# Patient Record
Sex: Female | Born: 1940 | Race: Black or African American | Hispanic: No | Marital: Married | Smoking: Never smoker
Health system: Southern US, Community
[De-identification: ages and names within clinical notes are randomized; demographics above are authoritative.]

## PROBLEM LIST (undated history)

## (undated) DIAGNOSIS — R9389 Abnormal findings on diagnostic imaging of other specified body structures: Secondary | ICD-10-CM

## (undated) DIAGNOSIS — H269 Unspecified cataract: Secondary | ICD-10-CM

## (undated) DIAGNOSIS — K219 Gastro-esophageal reflux disease without esophagitis: Secondary | ICD-10-CM

## (undated) DIAGNOSIS — M1990 Unspecified osteoarthritis, unspecified site: Secondary | ICD-10-CM

## (undated) DIAGNOSIS — Z98811 Dental restoration status: Secondary | ICD-10-CM

## (undated) DIAGNOSIS — E119 Type 2 diabetes mellitus without complications: Secondary | ICD-10-CM

## (undated) DIAGNOSIS — Z973 Presence of spectacles and contact lenses: Secondary | ICD-10-CM

## (undated) DIAGNOSIS — E079 Disorder of thyroid, unspecified: Secondary | ICD-10-CM

## (undated) DIAGNOSIS — N950 Postmenopausal bleeding: Secondary | ICD-10-CM

## (undated) DIAGNOSIS — L700 Acne vulgaris: Secondary | ICD-10-CM

## (undated) DIAGNOSIS — R0600 Dyspnea, unspecified: Secondary | ICD-10-CM

## (undated) DIAGNOSIS — Z9181 History of falling: Secondary | ICD-10-CM

## (undated) DIAGNOSIS — I10 Essential (primary) hypertension: Secondary | ICD-10-CM

## (undated) HISTORY — PX: CATARACT EXTRACTION: SUR2

## (undated) HISTORY — PX: EYE SURGERY: SHX253

## (undated) HISTORY — DX: Disorder of thyroid, unspecified: E07.9

## (undated) HISTORY — DX: Essential (primary) hypertension: I10

## (undated) HISTORY — DX: Unspecified osteoarthritis, unspecified site: M19.90

## (undated) HISTORY — PX: JOINT REPLACEMENT: SHX530

## (undated) HISTORY — PX: TONSILLECTOMY AND ADENOIDECTOMY: SHX28

## (undated) HISTORY — DX: Unspecified cataract: H26.9

## (undated) HISTORY — DX: Acne vulgaris: L70.0

## (undated) HISTORY — PX: TOTAL HIP ARTHROPLASTY: SHX124

---

## 1998-12-23 ENCOUNTER — Other Ambulatory Visit: Admission: RE | Admit: 1998-12-23 | Discharge: 1998-12-23 | Payer: Self-pay | Admitting: *Deleted

## 1999-11-30 ENCOUNTER — Encounter: Payer: Self-pay | Admitting: Endocrinology

## 1999-11-30 ENCOUNTER — Encounter: Admission: RE | Admit: 1999-11-30 | Discharge: 1999-11-30 | Payer: Self-pay | Admitting: Endocrinology

## 2000-02-03 ENCOUNTER — Other Ambulatory Visit: Admission: RE | Admit: 2000-02-03 | Discharge: 2000-02-03 | Payer: Self-pay | Admitting: *Deleted

## 2000-03-06 ENCOUNTER — Ambulatory Visit (HOSPITAL_COMMUNITY): Admission: RE | Admit: 2000-03-06 | Discharge: 2000-03-06 | Payer: Self-pay | Admitting: Internal Medicine

## 2000-03-06 ENCOUNTER — Encounter: Payer: Self-pay | Admitting: Internal Medicine

## 2000-12-14 ENCOUNTER — Encounter: Payer: Self-pay | Admitting: Endocrinology

## 2000-12-14 ENCOUNTER — Encounter: Admission: RE | Admit: 2000-12-14 | Discharge: 2000-12-14 | Payer: Self-pay | Admitting: Endocrinology

## 2001-06-13 ENCOUNTER — Emergency Department (HOSPITAL_COMMUNITY): Admission: EM | Admit: 2001-06-13 | Discharge: 2001-06-14 | Payer: Self-pay | Admitting: Emergency Medicine

## 2001-12-16 ENCOUNTER — Encounter: Admission: RE | Admit: 2001-12-16 | Discharge: 2001-12-16 | Payer: Self-pay | Admitting: Internal Medicine

## 2001-12-16 ENCOUNTER — Encounter: Payer: Self-pay | Admitting: Internal Medicine

## 2002-03-21 ENCOUNTER — Other Ambulatory Visit: Admission: RE | Admit: 2002-03-21 | Discharge: 2002-03-21 | Payer: Self-pay | Admitting: *Deleted

## 2003-01-16 ENCOUNTER — Encounter: Admission: RE | Admit: 2003-01-16 | Discharge: 2003-01-16 | Payer: Self-pay | Admitting: Internal Medicine

## 2003-12-15 ENCOUNTER — Encounter (HOSPITAL_COMMUNITY): Admission: RE | Admit: 2003-12-15 | Discharge: 2004-01-14 | Payer: Self-pay | Admitting: Family Medicine

## 2004-09-20 ENCOUNTER — Encounter: Admission: RE | Admit: 2004-09-20 | Discharge: 2004-09-20 | Payer: Self-pay | Admitting: Internal Medicine

## 2005-10-09 ENCOUNTER — Other Ambulatory Visit: Admission: RE | Admit: 2005-10-09 | Discharge: 2005-10-09 | Payer: Self-pay | Admitting: *Deleted

## 2005-10-20 ENCOUNTER — Encounter: Admission: RE | Admit: 2005-10-20 | Discharge: 2005-10-20 | Payer: Self-pay | Admitting: *Deleted

## 2006-01-23 ENCOUNTER — Encounter (HOSPITAL_COMMUNITY): Admission: RE | Admit: 2006-01-23 | Discharge: 2006-02-12 | Payer: Self-pay | Admitting: *Deleted

## 2006-02-13 HISTORY — PX: CATARACT EXTRACTION W/ INTRAOCULAR LENS  IMPLANT, BILATERAL: SHX1307

## 2006-02-14 ENCOUNTER — Encounter (HOSPITAL_COMMUNITY): Admission: RE | Admit: 2006-02-14 | Discharge: 2006-03-16 | Payer: Self-pay | Admitting: *Deleted

## 2006-03-19 ENCOUNTER — Encounter (HOSPITAL_COMMUNITY): Admission: RE | Admit: 2006-03-19 | Discharge: 2006-04-18 | Payer: Self-pay | Admitting: *Deleted

## 2006-11-01 ENCOUNTER — Encounter: Admission: RE | Admit: 2006-11-01 | Discharge: 2006-11-01 | Payer: Self-pay | Admitting: *Deleted

## 2007-10-03 HISTORY — PX: TOTAL HIP ARTHROPLASTY: SHX124

## 2007-10-07 ENCOUNTER — Inpatient Hospital Stay (HOSPITAL_COMMUNITY): Admission: RE | Admit: 2007-10-07 | Discharge: 2007-10-11 | Payer: Self-pay | Admitting: Orthopedic Surgery

## 2007-11-12 ENCOUNTER — Ambulatory Visit: Admission: RE | Admit: 2007-11-12 | Discharge: 2007-11-12 | Payer: Self-pay | Admitting: Orthopedic Surgery

## 2007-11-14 ENCOUNTER — Encounter (HOSPITAL_COMMUNITY): Admission: RE | Admit: 2007-11-14 | Discharge: 2007-12-14 | Payer: Self-pay | Admitting: Orthopedic Surgery

## 2007-12-23 ENCOUNTER — Encounter (HOSPITAL_COMMUNITY): Admission: RE | Admit: 2007-12-23 | Discharge: 2008-01-22 | Payer: Self-pay | Admitting: Orthopedic Surgery

## 2008-01-17 ENCOUNTER — Encounter: Admission: RE | Admit: 2008-01-17 | Discharge: 2008-01-17 | Payer: Self-pay | Admitting: Family Medicine

## 2008-06-03 ENCOUNTER — Inpatient Hospital Stay (HOSPITAL_COMMUNITY): Admission: RE | Admit: 2008-06-03 | Discharge: 2008-06-07 | Payer: Self-pay | Admitting: Orthopedic Surgery

## 2008-06-03 HISTORY — PX: TOTAL HIP ARTHROPLASTY: SHX124

## 2009-01-26 ENCOUNTER — Encounter: Admission: RE | Admit: 2009-01-26 | Discharge: 2009-01-26 | Payer: Self-pay | Admitting: Family Medicine

## 2009-06-24 ENCOUNTER — Encounter: Admission: RE | Admit: 2009-06-24 | Discharge: 2009-06-24 | Payer: Self-pay | Admitting: Emergency Medicine

## 2009-11-02 ENCOUNTER — Encounter: Admission: RE | Admit: 2009-11-02 | Discharge: 2009-11-02 | Payer: Self-pay | Admitting: Family Medicine

## 2009-11-09 ENCOUNTER — Other Ambulatory Visit: Admission: RE | Admit: 2009-11-09 | Discharge: 2009-11-09 | Payer: Self-pay | Admitting: Diagnostic Radiology

## 2009-11-09 ENCOUNTER — Encounter: Admission: RE | Admit: 2009-11-09 | Discharge: 2009-11-09 | Payer: Self-pay | Admitting: Family Medicine

## 2009-12-14 DIAGNOSIS — E890 Postprocedural hypothyroidism: Secondary | ICD-10-CM

## 2009-12-14 HISTORY — DX: Postprocedural hypothyroidism: E89.0

## 2009-12-25 HISTORY — PX: THYROIDECTOMY: SHX17

## 2009-12-27 ENCOUNTER — Encounter (INDEPENDENT_AMBULATORY_CARE_PROVIDER_SITE_OTHER): Payer: Self-pay | Admitting: Surgery

## 2009-12-27 ENCOUNTER — Ambulatory Visit (HOSPITAL_COMMUNITY): Admission: RE | Admit: 2009-12-27 | Discharge: 2009-12-28 | Payer: Self-pay | Admitting: Surgery

## 2010-02-24 ENCOUNTER — Encounter
Admission: RE | Admit: 2010-02-24 | Discharge: 2010-02-24 | Payer: Self-pay | Source: Home / Self Care | Attending: Family Medicine | Admitting: Family Medicine

## 2010-04-26 LAB — SURGICAL PCR SCREEN
MRSA, PCR: NEGATIVE
Staphylococcus aureus: NEGATIVE

## 2010-04-26 LAB — CBC
HCT: 40.5 % (ref 36.0–46.0)
MCH: 31.7 pg (ref 26.0–34.0)
RBC: 4.36 MIL/uL (ref 3.87–5.11)
RDW: 13.9 % (ref 11.5–15.5)
WBC: 5 10*3/uL (ref 4.0–10.5)

## 2010-04-26 LAB — BASIC METABOLIC PANEL
Chloride: 100 mEq/L (ref 96–112)
Creatinine, Ser: 0.8 mg/dL (ref 0.4–1.2)
GFR calc Af Amer: >60 mL/min (ref >60)
Glucose, Bld: 90 mg/dL (ref 70–99)

## 2010-05-25 LAB — CBC
HCT: 27.9 % — ABNORMAL LOW (ref 36.0–46.0)
MCHC: 34.1 g/dL (ref 30.0–36.0)
MCHC: 34.2 g/dL (ref 30.0–36.0)
MCHC: 34.7 g/dL (ref 30.0–36.0)
MCHC: 34.8 g/dL (ref 30.0–36.0)
MCV: 94.5 fL (ref 78.0–100.0)
MCV: 94.9 fL (ref 78.0–100.0)
Platelets: 137 10*3/uL — ABNORMAL LOW (ref 150–400)
Platelets: 145 10*3/uL — ABNORMAL LOW (ref 150–400)
Platelets: 159 10*3/uL (ref 150–400)
Platelets: 195 10*3/uL (ref 150–400)
RBC: 3.17 MIL/uL — ABNORMAL LOW (ref 3.87–5.11)
RDW: 13.2 % (ref 11.5–15.5)
RDW: 13.7 % (ref 11.5–15.5)
RDW: 13.9 % (ref 11.5–15.5)
WBC: 4.1 10*3/uL (ref 4.0–10.5)
WBC: 6.2 10*3/uL (ref 4.0–10.5)

## 2010-05-25 LAB — COMPREHENSIVE METABOLIC PANEL
Albumin: 4.1 g/dL (ref 3.5–5.2)
BUN: 12 mg/dL (ref 6–23)
Calcium: 9.5 mg/dL (ref 8.4–10.5)
GFR calc non Af Amer: >60 mL/min (ref >60)
Potassium: 3.7 mEq/L (ref 3.5–5.1)

## 2010-05-25 LAB — URINALYSIS, ROUTINE W REFLEX MICROSCOPIC
Nitrite: NEGATIVE
Specific Gravity, Urine: 1.012 (ref 1.005–1.030)
Urobilinogen, UA: 0.2 mg/dL (ref 0.0–1.0)
pH: 5.5 (ref 5.0–8.0)

## 2010-05-25 LAB — BASIC METABOLIC PANEL
BUN: 4 mg/dL — ABNORMAL LOW (ref 6–23)
BUN: 5 mg/dL — ABNORMAL LOW (ref 6–23)
Creatinine, Ser: 0.62 mg/dL (ref 0.4–1.2)
GFR calc Af Amer: >60 mL/min (ref >60)
GFR calc non Af Amer: >60 mL/min (ref >60)
Glucose, Bld: 142 mg/dL — ABNORMAL HIGH (ref 70–99)
Glucose, Bld: 144 mg/dL — ABNORMAL HIGH (ref 70–99)
Potassium: 3.8 mEq/L (ref 3.5–5.1)
Potassium: 3.9 mEq/L (ref 3.5–5.1)
Sodium: 135 mEq/L (ref 135–145)

## 2010-05-25 LAB — PROTIME-INR
Prothrombin Time: 13.4 seconds (ref 11.6–15.2)
Prothrombin Time: 15.7 seconds — ABNORMAL HIGH (ref 11.6–15.2)
Prothrombin Time: 22.9 seconds — ABNORMAL HIGH (ref 11.6–15.2)

## 2010-05-25 LAB — TYPE AND SCREEN: Antibody Screen: NEGATIVE

## 2010-06-28 NOTE — H&P (Signed)
NAME:  MINH, JASPER NO.:  [id number]   MEDICAL RECORD NO.:  [id number]          PATIENT TYPE:  INP   LOCATION:  1615                         FACILITY:  [facility]   PHYSICIAN:  Ollen Gross, M.D.    DATE OF BIRTH:  1940/12/24   DATE OF ADMISSION:  06/03/2008  DATE OF DISCHARGE:                              HISTORY & PHYSICAL   NOTE:  Date of office visit history and physical was May 26, 2008.   CHIEF COMPLAINT:  Right hip pain.   HISTORY OF THE PRESENT ILLNESS:  The patient is a 70 year old female who  has been seen by Dr. Lequita Halt for ongoing right hip pain.  She is well-  known to [place] having  previously undergone a left total hip back in August  2009.  She is doing well with the left hip, but the right hip continues  to be problematic.  It is felt that she would benefit from surgery.  She  has been seen preoperatively by Dr. Abigail Miyamoto and is felt to be stable for  surgery.   ALLERGIES:  No known drug allergies.   MEDICATIONS:  The patient's current medications include Micardis,  furosemide, levothyroxine, calcium plus D, aspirin, glucosamine, and  Tylenol.   PAST MEDICAL HISTORY:  1. Hypertension.  2. Hypothyroidism.  3. Osteoporosis.   PAST SURGICAL HISTORY:  Left total hip replacement on October 07, 2007.   FAMILY HISTORY:  Father with his emphysema.  Mother with stroke.   SOCIAL HISTORY:  The patient is married, retired and is a nonsmoker.  No  alcohol.  Her husband will be assisting with her care after surgery.   REVIEW OF SYSTEMS:  GENERAL:  No fevers, chills or night sweats.  NEUROLOGIC:  No seizures, syncope or paralysis.  RESPIRATORY:  No  shortness of breath, productive cough or hemoptysis.  CARDIOVASCULAR:  No chest pain.  GASTROINTESTINAL:  No nausea, vomiting or constipation.  GENITOURINARY:  No dysuria, hematuria or discharge.  MUSCULOSKELETAL:  Right hip pain.   PHYSICAL EXAMINATION:  VITAL SIGNS:  Pulse 76, respirations 12 and blood  pressure 138/80.  GENERAL APPEARANCE:  In general this is a 70 year old African American  female, well-nourished and well-developed, and in no acute distress.  She is alert, oriented, cooperative and pleasant; and, is a good  historian.  She is accompanied by her husband.  HEENT:  Normocephalic and atraumatic head.  Pupils are round and react.  Oropharynx is clear.  EOMs are intact.  NECK:  The neck is supple.  CHEST:  The chest is clear.  HEART:  The heart has a regular rate and rhythm.  No murmur.  S1 and S2  noted.  ABDOMEN:  The abdomen is soft and nontender.  Bowel sounds are present.  RECTAL, BREASTS AND GENITALIA:  The rectal, breast and genitalia exams  are not done at present.  EXTREMITIES:  The extremities reveal right hip flexion to 90, zero  internal rotation, zero external rotation and zero abduction.   IMPRESSION:  Osteoarthritis of the right hip.   PLAN:  The patient [place] admitted to [facility] to undergo a  right total replacement arthroplasty.  Surgery will be performed by Dr.  Ollen Gross.      Alexzandrew L. Perkins, P.A.C.      Ollen Gross, M.D.  Electronically Signed    ALP/MEDQ  D:  06/04/2008  T:  06/05/2008  Job:  [id number]   cc:   Chales Salmon. Abigail Miyamoto, M.D.  Fax: [phone]

## 2010-06-28 NOTE — Op Note (Signed)
NAME:  Anne [name], Anne [name] NO.:  [id number]   MEDICAL RECORD NO.:  [id number]          PATIENT TYPE:  INP   LOCATION:  0009                         FACILITY:  [facility]   PHYSICIAN:  Ollen Gross, M.D.    DATE OF BIRTH:  [birth year]   DATE OF PROCEDURE:  10/07/2007  DATE OF DISCHARGE:                               OPERATIVE REPORT   PREOPERATIVE DIAGNOSIS:  Osteoarthritis of the left hip.   POSTOPERATIVE DIAGNOSIS:  Osteoarthritis of the left hip.   PROCEDURE:  Left total hip arthroplasty.   SURGEON:  Ollen Gross, M.D.   ASSISTANT:  Alexzandrew L. Perkins, P.A.C.   ANESTHESIA:  General.   ESTIMATED BLOOD LOSS:  300 mL.   DRAINS:  Hemovac x1.   COMPLICATIONS:  None.   CONDITION:  Stable to recovery.   BRIEF CLINICAL NOTE:  Anne [name] is a 70 year old female with end-stage  arthritis of both hips, left more symptomatic than the right.  She has  failed nonoperative management and presents now for total hip  arthroplasty.   PROCEDURE IN DETAIL:  After the successful administration of general  anesthetic, the patient was placed in the right lateral decubitus  position with the left side up and held with the hip positioner.  The  left lower extremity was isolated from the perineum with plastic drapes,  and prepped and draped in the usual sterile fashion.  A short  posterolateral incision was made with the 10 blade through subcutaneous  tissue, to the level of the fascia lata.  This was incised in line with  the skin incision.  The sciatic nerve was palpated and protected, and  the short external rotators were isolated off the femur.  Capsulectomy  was performed and the hip was dislocated.  The center of the femoral  head was marked and trial prosthesis was placed, such that the center of  the trial head corresponded to the center of the native femoral head.  Osteotomy line was marked on the femoral neck and osteotomy made with an  oscillating saw.  The femoral  head was removed and the femur was  retracted anteriorly to gain acetabular exposure.   The acetabular retractors were placed and the labrum and osteophytes  removed.  Reaming started at 43 mm, coursing from 2-49 mm; and then a 50-  mm Pinnacle acetabular shell was placed in anatomic position and  transfixed with 2 dome screws.  The Apex hole eliminator was placed and  then the permanent 36-mm neutral Ultramet metal liner was placed, for a  metal-on-metal hip replacement.   The femur was repaired through the canal finder and irrigation.  Axial  reaming was performed to 11.5 mm; proximal reaming to a 9F, and the  sleeve machined through a small crib.  A 9F small trial sleeve was  placed with a 16 x 11 stem and a 36 +6 neck in matching native  anteversion.  The 36 +0 head was placed.  The hip was reduced with  outstanding stability.  It was full extension and full external  rotation, 70 degrees of flexion, 40 degrees  of adduction and 90 degrees  of internal rotation; and 90 degrees of flexion and 70 degrees of  internal rotation.  By placing the left leg on top of the right, it was  felt as though the leg lengths were equal.  The hip was then dislocated  and the femoral trials were removed.  The permanent 60F small sleeve was  placed with a 16 x 11 stem and 36 +6 neck matching the native  anteversion.  The 36 +0 was placed and the hip was reduced with same  ability parameters.  The wound was copiously irrigated with saline  solution and short rotators reattached to the femur through drill holes.  The fascia lata was closed over a Hemovac drain with interrupted #1  Vicryl; the subcutaneous closed with #1 and 2-0 Vicryl and subcuticular  running 4-0 Monocryl.  The incision was cleaned and dried and Steri-  Strips applied.  A drain was hooked to suction and a bulky sterile  dressing applied.  She was placed into a knee immobilizer, awakened and  transferred to recovery in stable  condition.      Ollen Gross, M.D.  Electronically Signed     FA/MEDQ  D:  10/07/2007  T:  10/07/2007  Job:  [id number]

## 2010-06-28 NOTE — H&P (Signed)
NAME:  Anne [name], Anne [name] NO.:  [id number]   MEDICAL RECORD NO.:  [id number]          PATIENT TYPE:  INP   LOCATION:  1621                         [facility]ACILITY[facility]  [facility]   PHYSICIAN:  Ollen Gross, M.D.    DATE OF BIRTH:  1940/06/29   DATE OF ADMISSION:  10/07/2007  DATE OF DISCHARGE:  10/11/2007                              HISTORY & PHYSICAL   CHIEF COMPLAINT:  Left hip pain.   HISTORY OF PRESENT ILLNESS:  The patient is a 70 year old female who is  known to have arthritis in the left hip.  It has been ongoing for quite  some time now.  She has end-stage in both hips with left is more  symptomatic and problematic than the right.  She has failed nonoperative  management.  Now presents for total hip arthroplasty.   ALLERGIES:  No known drug allergies.   CURRENT MEDICATIONS:  Furosemide, baby aspirin, Micardis, levothyroxine,  calcium, vitamin D, Aleve, glucosamine with MSM.   PAST MEDICAL HISTORY:  1. Hypertension.  2. Hypothyroidism.  3. Osteoporosis.   PREVIOUS SURGERY:  Oral surgery 30 years ago.   FAMILY HISTORY:  Father age 42.  Mother  with history of stroke age 70.   SOCIAL HISTORY:  Married, retired, nonsmoker.  No alcohol.  She does  have caregiver lined up after surgery.   REVIEW OF SYSTEMS:  GENERAL:  No fever, chills or night sweats.  NEUROLOGICAL:  No seizures, syncope or paralysis.  RESPIRATORY:  No  shortness of breath, productive cough or hemoptysis.  CARDIOVASCULAR:  No chest pain, orthopnea.  GI:  No nausea, vomiting, diarrhea or  constipation.  GU:  No dysuria, hematuria or discharge.  MUSCULOSKELETAL:  Bilateral hip pain.   PHYSICAL EXAMINATION:  VITAL SIGNS:  Pulse 64, respirations 12, blood  pressure 144/72.  GENERAL:  A 70 year old [place] American female well-nourished, well-  developed, in no acute stress, alert, oriented, cooperative, and  pleasant.  Good historian, accompanied by her husband.  HEENT:  Normocephalic, atraumatic.   Pupils are round, reactive.  EOMs  intact.  NECK:  Supple.  CHEST:  Clear.  HEART:  Regular rate and rhythm without murmur.  ABDOMEN:  Soft, slightly round.  Bowel sounds present.  RECTAL/BREASTS/GENITALIA:  Not done, not pertinent to present illness.  EXTREMITIES:  Left hip flexion 90, 0 internal rotation, 0 external  rotation, 0 abduction, 0 adduction.  Right hip flexion 100, 0 internal  rotation, 0 external rotation, 0 abduction, 0 adduction.   IMPRESSION:  1. Osteoarthritis bilateral hips left more symptomatic than right.  2. Hypertension.  3. Hypothyroidism.  4. Osteoporosis.   PLAN:  The patient admitted to [facility] to undergo a left  total replacement arthroplasty.  Surgery will be performed by Dr. Ollen Gross.      Alexzandrew L. Perkins, P.A.C.      Ollen Gross, M.D.  Electronically Signed    ALP/MEDQ  D:  10/31/2007  T:  11/02/2007  Job:  [id number]   cc:   Chales Salmon. Abigail Miyamoto, M.D.  Fax: [phone]

## 2010-06-28 NOTE — Discharge Summary (Signed)
NAME:  Anne [name], Anne [name] NO.:  [id number]   MEDICAL RECORD NO.:  [id number]          PATIENT TYPE:  INP   LOCATION:  1621                         FACILITY:  [facility]   PHYSICIAN:  Ollen Gross, M.D.    DATE OF BIRTH:  1940-10-22   DATE OF ADMISSION:  10/07/2007  DATE OF DISCHARGE:  10/11/2007                               DISCHARGE SUMMARY   ADMITTING DIAGNOSES:  1. Osteoarthritis of the left hip greater than osteoarthritis right      hip.  2. Hypertension.  3. Hypothyroidism.  4. Osteoporosis.   DISCHARGE DIAGNOSES:  1. Osteoarthritis left hip status post left total hip replacement      arthroplasty.  2. Mild postoperative blood loss anemia.  Did not require transfusion.  3. Hypertension.  4. Hypothyroidism.  5. Osteoporosis.   PROCEDURE:  October 07, 2007 left total hip.  Surgeon Dr. Lequita Halt,  assistant Avel Peace PA-C.  Anesthesia:  General.   CONSULTS:  None.   BRIEF HISTORY:  Ms. Rohwer is a 70 year old female with end-stage  arthritis of both hips left more symptomatic than the right.  Failed  nonoperative management, now presents for total hip arthroplasty.   LABORATORY DATA:  Preop CBC showed hemoglobin 13.0, hematocrit 38.1,  white cell count 3.5, platelets 190, postop hemoglobin 11.5 and then  drifted down to 10.3.  Last known H and H 9.7 and 28.5.  PT/PTT preop  14.8 and 30, respectively.  INR 1.1.  Serial protimes followed.  Last  known PT/INR are 22.9 and 1.9.  Chem panel on admission all within  normal limits.  Serial BMETs were followed.  Electrolytes remained  within normal limits.  Glucose went up from 98 to 150, back down to 141.  Preop UA:  Small leukocyte esterase, many epithelials, only 3-6 white  cells, 0-2 red cells, many bacteria.  Blood group type O+.  Two-view  chest October 01, 2007:  No active disease.  EKG September 07, 2007:  Normal  sinus rhythm, diffuse nonspecific T abnormalities confirmed by Dr.  Abigail Miyamoto.   HOSPITAL COURSE:   The patient admitted to [facility].  Tolerated the procedure well, later transferred to the recovery room  and the orthopedic floor.  Started on PCA and p.o. analgesic pain  control following surgery.  Had had a decent night after evening of  surgery.  Doing pretty well on the morning of day #1.  Started getting  up out of bed, partial weightbearing.  Hemoglobin was stable.  Hemovac  drain placed at time of surgery was pulled without difficulty.  Blood  pressure looked good.  Started back on her home medications. Started  getting up and walking short distances on day #1.  By day #2 getting a  little bit more in the hallway.  Dressing was changed.  Incision looked  good.  Hemoglobin stable.  No complaints.  By day #3 she was finally  starting to get a good night's sleep.  By day #2 into day #3, no bowel  movement yet.  Worked on GI tract with medications.  Hemoglobin was 9.7.  Needed a little bit more therapy.  Placed her on an iron supplement.  Felt 1 more day.  By the following day of October 11, 2007 she was  progressing well, walking better, 50 feet and then later in the  afternoon to 225 feet.  Met her goals and was discharged home.   DISCHARGE PLAN:  1. The patient was discharged home on October 11, 2007.  2. Discharge diagnoses, please see above.  3. Discharge medications:  Coumadin, Nu-Iron, Percocet, Phenergan,      Robaxin.   DIET:  Heart-healthy diet.   ACTIVITY:  Partial weightbearing 25% to 50% left lower extremity.  Home  health PT, home health nursing, total hip protocol, hip precautions.   FOLLOWUP:  Two weeks.   DISPOSITION:  Home.   CONDITION UPON DISCHARGE:  Improved.      Alexzandrew L. Perkins, P.A.C.      Ollen Gross, M.D.  Electronically Signed    ALP/MEDQ  D:  10/31/2007  T:  11/02/2007  Job:  [id number]   cc:   Chales Salmon. Abigail Miyamoto, M.D.  Fax: [phone]

## 2010-06-28 NOTE — Op Note (Signed)
NAME:  Anne [name], Anne [name] NO.:  [id number]   MEDICAL RECORD NO.:  [id number]          PATIENT TYPE:  INP   LOCATION:  0002                         FACILITY:  [facility]   PHYSICIAN:  Ollen Gross, M.D.    DATE OF BIRTH:  1940-02-24   DATE OF PROCEDURE:  06/03/2008  DATE OF DISCHARGE:                               OPERATIVE REPORT   PREOPERATIVE DIAGNOSIS:  Osteoarthritis, right hip.   POSTOPERATIVE DIAGNOSIS:  Osteoarthritis, right hip.   PROCEDURE:  Right total hip arthroplasty.   SURGEON:  Ollen Gross, M.D.   ASSISTANT:  Avel Peace, PA-C.   ANESTHESIA:  General.   ESTIMATED BLOOD LOSS:  200.   DRAINS:  Hemovac x1.   COMPLICATIONS:  None.   CONDITION:  Stable to recovery room.   BRIEF CLINICAL NOTE:  Ms. Hiltunen is a 70 year old female with end-stage  arthritis of the right hip with progressively worsening pain and  dysfunction.  She has had a previous successful left total hip  arthroplasty and presents now for right total hip arthroplasty.   PROCEDURE IN DETAIL:  After successful administration of general  anesthetic, the patient was placed in the left lateral decubitus  position with the right side up and held with the hip positioner, the  right lower extremity isolated from her perineum with plastic drapes and  prepped and draped in the usual sterile fashion.  A posterolateral  incision was made with a 10 blade through the subcutaneous tissue to the  level of the fascia lata, which was incised in line with the skin  incision.  The sciatic nerve was palpated and protected and the short  external rotators isolated off the femur.  She had a significant  capsular contraction and we released the capsule and removed the  posterior capsule, which was very shortened.  I was then able to  dislocate the hip.  The center of femoral head was marked and a trial  prosthesis placed such that the center of the trial head corresponded to  the center of her native  femoral head.  The osteotomy line was marked on  the femoral neck and osteotomy made with an oscillating saw.  The  femoral head was removed and the femur was retracted anteriorly to gain  acetabular exposure.   Acetabular retractors were placed and labrum and osteophytes removed.  Acetabular reaming started at 45 mm, in coursing increments of 2 to 49  mm, and then a 50-mm Pinnacle acetabular shell was placed in anatomic  position and transfixed with 2 dome screws.  The Apex hole eliminator  was placed and then the 36-mm neutral Ultamet metal liner was placed for  a metal-on-metal hip replacement.   The femur was prepared with the canal finder and irrigation.  Axial  reaming was performed to 11.5 mm, proximal reaming to 16 F,  and the  sleeve machined to a large.  A 16 F large sleeve was placed, a 16 x 11  stem, and a 36 +6 neck.  We matched her native anteversion with this.  The 36 +0 head was placed and the  hip was reduced, with outstanding  stability.  There was full extension, full external rotation, 70 degrees  of flexion, 40 degrees of adduction and 90 degrees of internal rotation  and 90 degrees of flexion and 70 degrees of internal rotation.  By  placing the right leg on top of the left, it was felt that the leg  lengths were equal.  The hip was then dislocated and the trials removed.  The permanent 16 F large sleeve was placed with the 16 x 11 stem and 36  +6 neck matching native anteversion.  The 36 +0 head was placed and the  hip was reduced with the same stability parameters.  The wounds were  copiously irrigated with saline solution and the short rotators  reattached to the femur through drill holes.  The fascia lata was closed  over a Hemovac drain with interrupted #1 Vicryl, the subcu closed with  #1-0 and #2-0 Vicryl and subcuticular running 4-0 Monocryl.  The drain  was hooked to suction.  The incision was cleaned and dried and Steri-  Strips and bulky sterile dressing  were applied.  She was then placed  into a knee immobilizer, awakened and transported to recovery in stable  condition.      Ollen Gross, M.D.  Electronically Signed     FA/MEDQ  D:  06/03/2008  T:  06/03/2008  Job:  [id number]

## 2010-07-01 NOTE — Discharge Summary (Signed)
NAME:  Anne [name], Anne [name] NO.:  [id number]   MEDICAL RECORD NO.:  [id number]          PATIENT TYPE:  INP   LOCATION:  1615                         FACILITY[facility]   PHYSICIAN:  Ollen Gross, M.D.    DATE OF BIRTH:  [birth year]   DATE OF ADMISSION:  06/03/2008  DATE OF DISCHARGE:  06/07/2008                               DISCHARGE SUMMARY   ADMISSION DIAGNOSES:  1. Osteoarthritis right hip.  2. Hypertension.  3. Hypothyroidism.  4. Osteoporosis.   DISCHARGE DIAGNOSES:  1. Osteoarthritis right hip status post right total replacement      arthroplasty.  2. Acute postop blood loss anemia.  Did not require transfusion.  3. Hypertension.  4. Hypothyroidism.  5. Osteoporosis.   PROCEDURE:  June 03, 2008, right total hip.  Surgeon Dr. Lequita Halt.  Assistant, Avel Peace PA-C.  Anesthesia general.   CONSULTATIONS:  None.   BRIEF HISTORY:  Anne [name] is a 70 year old female with end-stage  arthritis right hip, progressive worsening pain dysfunction, had a  successful left total hip, now presents for a right total hip.   LABORATORY DATA:  Preop CBC showed hemoglobin 13.79, hematocrit 40.2,  white cell count 4.1, platelets 195, postop hemoglobin 10.2 drifted down  to 9.1.  Last noted H&H 8.4 and 24.7.  PT/PTT preop 13.4 and 28,  respectively.  INR 1.0.  Serial protimes followed per Coumadin protocol.  Last PT/INR 23.6 and 2.0.  Chem panel on admission all within normal  limits.  Serial B mets followed.  Electrolytes remained within normal  limits.  Preop UA negative.  Blood group type O+.  EKG May 05, 2008,  sinus rhythm, atrial premature complexes, nonspecific diffuse ST and T-  wave abnormalities confirmed by Dr. Abigail Miyamoto.   X-rays right hip film May 29, 2008, advanced degenerative changes  right hip.  Portable pelvis and hip film on June 03, 2008, expected  postop appearance right hip prosthesis.  No evidence of fracture  dislocation.   HOSPITAL COURSE:   The patient was admitted to [facility],  taken to OR, underwent above-stated procedure without complication.  The  patient tolerated procedure well, later transferred to the recovery room  on orthopedic floor.  Started on PCA and p.o. analgesic pain control  following surgery.  Doing pretty well on the morning of day #1, in good  spirits, husband in the room.  Started getting up with therapy.  Blood  pressure was a little on the soft side, so we put her blood pressure  medications on parameters.  She had excellent urinary output.  Hemoglobin stable.  By day #2, she was a little sleepy, but doing well  and hemoglobin was down 9.7.  She was asymptomatic with this.  We put  her on iron, discontinued the PCA on day #1 and the fluids on day #2.  Dressing changed, incision looked good, started walking about 80 feet.  Seen on rounds on day #3.  Hemoglobins down a little bit further.  She  is asymptomatic with this, wanted to make sure that she was going to be  stable.  Continued to do her therapy.  She was seen by the weekend  coverage ,progressing well.  Due to her anemia, they monitored for one  more day.  By the following day, her hemoglobin was down a little bit  further at 8.4.  She was asymptomatic with this.  She was progressing  well with physical therapy and then she was discharged home.   DISCHARGE/PLAN:  1. Patient discharged home on June 07, 2008.  2. Discharge diagnoses, please see above.  3. Discharge medications:  Percocet, Robaxin, Nu-Iron Coumadin.  4. Diet:  Heart-healthy diet.  5. Activity:  She is partial weightbearing 25-50%, right leg hip      precautions total hip protocol.  6. Follow-up 2 weeks.   DISPOSITION:  Home.   CONDITION ON DISCHARGE:  Improved.      Alexzandrew L. Perkins, P.A.C.      Ollen Gross, M.D.  Electronically Signed    ALP/MEDQ  D:  06/18/2008  T:  06/18/2008  Job:  [id number]   cc:   Ollen Gross, M.D.  Fax: [phone]    Chales Salmon. Abigail Miyamoto, M.D.  Fax: [phone]

## 2011-03-20 DIAGNOSIS — M76899 Other specified enthesopathies of unspecified lower limb, excluding foot: Secondary | ICD-10-CM | POA: Diagnosis not present

## 2011-03-30 ENCOUNTER — Other Ambulatory Visit: Payer: Self-pay | Admitting: Family Medicine

## 2011-03-30 ENCOUNTER — Ambulatory Visit: Payer: Medicare Other

## 2011-03-30 ENCOUNTER — Ambulatory Visit (INDEPENDENT_AMBULATORY_CARE_PROVIDER_SITE_OTHER): Payer: Medicare Other | Admitting: Family Medicine

## 2011-03-30 VITALS — BP 158/79 | HR 76 | Temp 97.9°F | Resp 18 | Ht 65.0 in | Wt 217.0 lb

## 2011-03-30 DIAGNOSIS — L0291 Cutaneous abscess, unspecified: Secondary | ICD-10-CM | POA: Diagnosis not present

## 2011-03-30 DIAGNOSIS — L0390 Cellulitis, unspecified: Secondary | ICD-10-CM

## 2011-03-30 MED ORDER — DOXYCYCLINE HYCLATE 100 MG PO TABS: 100.0000 mg | ORAL_TABLET | Freq: Two times a day (BID) | ORAL | Status: AC

## 2011-03-30 MED ORDER — MUPIROCIN 2 % EX OINT: TOPICAL_OINTMENT | Freq: Three times a day (TID) | CUTANEOUS | Status: AC

## 2011-03-30 NOTE — Patient Instructions (Addendum)
Keep the wound clean. Cover with a Band-Aid. Return if not completely well by the time the antibiotics are finished. Turn sooner if problems arise.

## 2011-03-30 NOTE — Progress Notes (Signed)
  Subjective:    Patient ID: Anne [name], female    DOB: April 21, 1940, 71 y.o.   MRN: [id number]  HPI She has a sore place on her right upper chest wall. She was aware of something there for the past couple of months. Over the past week it tested up, got red, with a white center which drained. Word or so she came in here to get it checked. She is not on clindamycin today only for a dental appointment that she had. She will see her dentist back again next week, but has not stay on clindamycin except for pre-dental care. Denies other places on her body. She is not diabetic.   Review of Systems Unremarkable.    Objective:   Physical Exam  1 CM diameter scabbed area right upper chest wall. Minimal surrounding erythema. The crust was scraped off and there is a small ulcerated cavity. This was cultured.      Assessment & Plan:  Abscess right chest wall.  Treat with topical ointment and oral antibiotic. Return if worse

## 2011-04-02 LAB — WOUND CULTURE
Gram Stain: NONE SEEN
Gram Stain: NONE SEEN

## 2011-05-26 ENCOUNTER — Ambulatory Visit: Payer: Medicare Other

## 2011-06-27 ENCOUNTER — Other Ambulatory Visit: Payer: Self-pay | Admitting: Family Medicine

## 2011-06-27 DIAGNOSIS — Z1231 Encounter for screening mammogram for malignant neoplasm of breast: Secondary | ICD-10-CM

## 2011-08-28 ENCOUNTER — Ambulatory Visit
Admission: RE | Admit: 2011-08-28 | Discharge: 2011-08-28 | Disposition: A | Discharge status: Home / Self Care | Payer: Medicare Other | Source: Ambulatory Visit | Attending: Family Medicine | Admitting: Family Medicine

## 2011-08-28 DIAGNOSIS — Z1231 Encounter for screening mammogram for malignant neoplasm of breast: Secondary | ICD-10-CM

## 2011-09-20 ENCOUNTER — Ambulatory Visit (INDEPENDENT_AMBULATORY_CARE_PROVIDER_SITE_OTHER): Payer: Medicare Other | Admitting: Family Medicine

## 2011-09-20 VITALS — BP 137/76 | HR 76 | Temp 97.4°F | Resp 16 | Ht 65.0 in | Wt 218.2 lb

## 2011-09-20 DIAGNOSIS — Q849 Congenital malformation of integument, unspecified: Secondary | ICD-10-CM | POA: Diagnosis not present

## 2011-09-20 DIAGNOSIS — E039 Hypothyroidism, unspecified: Secondary | ICD-10-CM | POA: Diagnosis not present

## 2011-09-20 DIAGNOSIS — L989 Disorder of the skin and subcutaneous tissue, unspecified: Secondary | ICD-10-CM

## 2011-09-20 LAB — TSH: TSH: 1.2 u[IU]/mL (ref 0.350–4.500)

## 2011-09-20 NOTE — Progress Notes (Signed)
  Subjective:    Patient ID: Anne [name], female    DOB: 08-04-1940, 71 y.o.   MRN: [id number]  HPI  Patient presents for thyroid check.  She has been stable on current medication, however she has recently started noticing that she has difficulty swallowing certain foods.  This started 3-4 days ago. She is able to swallow but certain foods "get stuck."  Otherwise she feels normal and denies any other complaints.    In addition she complains of a pruritic spot on her right shoulder.   This has been here since February when she had an abscess that was drained.  Says it has been doing well but there is a dark "line" in the center that is irritating and itchy at times. Denies drainage, warmth, tenderness, or erythema.      Review of Systems  All other systems reviewed and are negative.       Objective:   Physical Exam  Constitutional: She is oriented to person, place, and time. She appears well-developed and well-nourished.  HENT:  Head: Normocephalic and atraumatic.  Right Ear: External ear normal.  Left Ear: External ear normal.  Eyes: Conjunctivae are normal.  Neck: Normal range of motion.  Cardiovascular: Normal rate.   Pulmonary/Chest: Effort normal.  Neurological: She is alert and oriented to person, place, and time.  Skin:     Psychiatric: She has a normal mood and affect. Her behavior is normal. Judgment and thought content normal.   Patient has a barely palpable smooth thyroid gland that is nontender and not significantly enlarged.   Seen by Dr. Milus Glazier.     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  1. Hypothyroidism  TSH   I've made arrangements to biopsy the patient's skin lesion next Monday at 3 PM.

## 2011-09-25 ENCOUNTER — Ambulatory Visit (INDEPENDENT_AMBULATORY_CARE_PROVIDER_SITE_OTHER): Payer: Medicare Other | Admitting: Family Medicine

## 2011-09-25 VITALS — BP 146/86 | HR 70 | Temp 98.2°F | Resp 16 | Ht 65.5 in | Wt 216.5 lb

## 2011-09-25 DIAGNOSIS — L708 Other acne: Secondary | ICD-10-CM | POA: Diagnosis not present

## 2011-09-25 DIAGNOSIS — L989 Disorder of the skin and subcutaneous tissue, unspecified: Secondary | ICD-10-CM | POA: Diagnosis not present

## 2011-09-25 NOTE — Progress Notes (Signed)
71 yo woman is here for biopsy and removal of right upper chest lesion which has been itchy and rough for a long time.  She had an abscess drained there in the past, and a black linear rough part of skin was left, catching on clothing.  O:  3 mm lesion, vertical, hyperpigmented, raised and 1 mm wide was removed after 1. Informed consent 2. 1% xylo with epi infiltration 3. Sterile prep with betadine   No complications  A:  Atypical nevus right chest.

## 2011-09-28 ENCOUNTER — Ambulatory Visit (INDEPENDENT_AMBULATORY_CARE_PROVIDER_SITE_OTHER): Payer: Medicare Other | Admitting: Family Medicine

## 2011-09-28 DIAGNOSIS — IMO0002 Reserved for concepts with insufficient information to code with codable children: Secondary | ICD-10-CM

## 2011-09-28 DIAGNOSIS — D2360 Other benign neoplasm of skin of unspecified upper limb, including shoulder: Secondary | ICD-10-CM

## 2011-09-28 NOTE — Progress Notes (Signed)
Patient ID: Anne [name], female DOB: 1940/09/13, 71 y.o. MRN: [id number]  HPI Patient presents for thyroid check. She has been stable on current medication, however she has recently started noticing that she has difficulty swallowing certain foods. This started 3-4 days ago. She is able to swallow but certain foods "get stuck." Otherwise she feels normal and denies any other complaints.  In addition she complains of a pruritic spot on her right shoulder. This has been here since February when she had an abscess that was drained. Says it has been doing well but there is a dark "line" in the center that is irritating and itchy at times. Denies drainage, warmth, tenderness, or erythema.  Review of Systems  All other systems reviewed and are negative.    Objective:   Physical Exam  Constitutional: She is oriented to person, place, and time. She appears well-developed and well-nourished.  HENT:  Head: Normocephalic and atraumatic.  Right Ear: External ear normal.  Left Ear: External ear normal.  Eyes: Conjunctivae are normal.  Neck: Normal range of motion.  Cardiovascular: Normal rate.  Pulmonary/Chest: Effort normal.  Neurological: She is alert and oriented to person, place, and time.  Skin:    Psychiatric: She has a normal mood and affect. Her behavior is normal. Judgment and thought content normal.   Patient has a barely palpable smooth thyroid gland that is nontender and not significantly enlarged.  Seen by Dr. Milus Glazier.   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  1.  Hypothyroidism  TSH    I've made arrangements to biopsy the patient's skin lesion next Monday at 3 PM.   On Monday, we anesth. The area with lidocaine and then did a punch biopsy.  The suture was removed today without any problems.  The area is healing well  Biopsy is pending

## 2011-09-28 NOTE — Patient Instructions (Addendum)
Patient ID: Anne [name], female DOB: 10/16/1940, 71 y.o. MRN: [id number]  HPI Patient presents for thyroid check. She has been stable on current medication, however she has recently started noticing that she has difficulty swallowing certain foods. This started 3-4 days ago. She is able to swallow but certain foods "get stuck." Otherwise she feels normal and denies any other complaints.  In addition she complains of a pruritic spot on her right shoulder. This has been here since February when she had an abscess that was drained. Says it has been doing well but there is a dark "line" in the center that is irritating and itchy at times. Denies drainage, warmth, tenderness, or erythema.  Review of Systems  All other systems reviewed and are negative.    Objective:   Physical Exam  Constitutional: She is oriented to person, place, and time. She appears well-developed and well-nourished.  HENT:  Head: Normocephalic and atraumatic.  Right Ear: External ear normal.  Left Ear: External ear normal.  Eyes: Conjunctivae are normal.  Neck: Normal range of motion.  Cardiovascular: Normal rate.  Pulmonary/Chest: Effort normal.  Neurological: She is alert and oriented to person, place, and time.  Skin:    Psychiatric: She has a normal mood and affect. Her behavior is normal. Judgment and thought content normal.   Patient has a barely palpable smooth thyroid gland that is nontender and not significantly enlarged.  Seen by Dr. Melisssa Donner.   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  1.  Hypothyroidism  TSH    I've made arrangements to biopsy the patient's skin lesion next Monday at 3 PM.   On Monday, we anesth. The area with lidocaine and then did a punch biopsy.  The suture was removed today without any problems.  The area is healing well  Biopsy is pending  

## 2011-11-08 ENCOUNTER — Encounter: Payer: Self-pay | Admitting: Family Medicine

## 2011-11-08 DIAGNOSIS — L700 Acne vulgaris: Secondary | ICD-10-CM | POA: Insufficient documentation

## 2011-12-11 ENCOUNTER — Other Ambulatory Visit: Payer: Self-pay | Admitting: Radiology

## 2011-12-11 MED ORDER — LOSARTAN POTASSIUM 50 MG PO TABS: 50.0000 mg | ORAL_TABLET | Freq: Every day | ORAL | Status: DC

## 2012-01-04 ENCOUNTER — Ambulatory Visit (INDEPENDENT_AMBULATORY_CARE_PROVIDER_SITE_OTHER): Payer: Medicare Other | Admitting: Family Medicine

## 2012-01-04 ENCOUNTER — Encounter: Payer: Self-pay | Admitting: Family Medicine

## 2012-01-04 VITALS — BP 128/100 | HR 67 | Temp 98.2°F | Resp 16 | Ht 65.0 in | Wt 220.0 lb

## 2012-01-04 DIAGNOSIS — E039 Hypothyroidism, unspecified: Secondary | ICD-10-CM | POA: Diagnosis not present

## 2012-01-04 DIAGNOSIS — Z23 Encounter for immunization: Secondary | ICD-10-CM | POA: Diagnosis not present

## 2012-01-04 DIAGNOSIS — M160 Bilateral primary osteoarthritis of hip: Secondary | ICD-10-CM

## 2012-01-04 DIAGNOSIS — C73 Malignant neoplasm of thyroid gland: Secondary | ICD-10-CM

## 2012-01-04 DIAGNOSIS — K047 Periapical abscess without sinus: Secondary | ICD-10-CM | POA: Diagnosis not present

## 2012-01-04 DIAGNOSIS — I10 Essential (primary) hypertension: Secondary | ICD-10-CM

## 2012-01-04 DIAGNOSIS — Z0000 Encounter for general adult medical examination without abnormal findings: Secondary | ICD-10-CM

## 2012-01-04 DIAGNOSIS — R490 Dysphonia: Secondary | ICD-10-CM

## 2012-01-04 DIAGNOSIS — E785 Hyperlipidemia, unspecified: Secondary | ICD-10-CM

## 2012-01-04 LAB — COMPREHENSIVE METABOLIC PANEL
ALT: 16 U/L (ref 0–35)
AST: 21 U/L (ref 0–37)
Albumin: 4.4 g/dL (ref 3.5–5.2)
Alkaline Phosphatase: 67 U/L (ref 39–117)
BUN: 11 mg/dL (ref 6–23)
CO2: 28 mEq/L (ref 19–32)
Calcium: 9.9 mg/dL (ref 8.4–10.5)
Chloride: 104 mEq/L (ref 96–112)
Creat: 0.7 mg/dL (ref 0.50–1.10)
Glucose, Bld: 100 mg/dL — ABNORMAL HIGH (ref 70–99)
Potassium: 4.2 mEq/L (ref 3.5–5.3)
Sodium: 140 mEq/L (ref 135–145)
Total Bilirubin: 0.4 mg/dL (ref 0.3–1.2)
Total Protein: 7.5 g/dL (ref 6.0–8.3)

## 2012-01-04 LAB — CBC WITH DIFFERENTIAL/PLATELET
Basophils Absolute: 0 10*3/uL (ref 0.0–0.1)
Basophils Relative: 0 % (ref 0–1)
Eosinophils Absolute: 0.1 10*3/uL (ref 0.0–0.7)
Eosinophils Relative: 2 % (ref 0–5)
HCT: 38.9 % (ref 36.0–46.0)
Hemoglobin: 13.5 g/dL (ref 12.0–15.0)
Lymphocytes Relative: 40 % (ref 12–46)
Lymphs Abs: 1.6 10*3/uL (ref 0.7–4.0)
MCH: 31.9 pg (ref 26.0–34.0)
MCHC: 34.7 g/dL (ref 30.0–36.0)
MCV: 92 fL (ref 78.0–100.0)
Monocytes Absolute: 0.3 10*3/uL (ref 0.1–1.0)
Monocytes Relative: 7 % (ref 3–12)
Neutro Abs: 2 10*3/uL (ref 1.7–7.7)
Neutrophils Relative %: 51 % (ref 43–77)
Platelets: 230 10*3/uL (ref 150–400)
RBC: 4.23 MIL/uL (ref 3.87–5.11)
RDW: 13.9 % (ref 11.5–15.5)
WBC: 3.9 10*3/uL — ABNORMAL LOW (ref 4.0–10.5)

## 2012-01-04 LAB — POCT URINALYSIS DIPSTICK
Bilirubin, UA: NEGATIVE
Glucose, UA: NEGATIVE
Ketones, UA: NEGATIVE
Leukocytes, UA: NEGATIVE
Nitrite, UA: NEGATIVE
Protein, UA: NEGATIVE
Spec Grav, UA: <=1.005
Urobilinogen, UA: 0.2
pH, UA: 5.5

## 2012-01-04 LAB — LIPID PANEL
Cholesterol: 243 mg/dL — ABNORMAL HIGH (ref 0–200)
HDL: 48 mg/dL (ref >39)
LDL Cholesterol: 160 mg/dL — ABNORMAL HIGH (ref 0–99)
Total CHOL/HDL Ratio: 5.1 Ratio
Triglycerides: 176 mg/dL — ABNORMAL HIGH (ref <150)
VLDL: 35 mg/dL (ref 0–40)

## 2012-01-04 MED ORDER — LOSARTAN POTASSIUM-HCTZ 100-12.5 MG PO TABS: 1.0000 | ORAL_TABLET | Freq: Every day | ORAL | Status: DC

## 2012-01-04 MED ORDER — LEVOTHYROXINE SODIUM 100 MCG PO TABS: 100.0000 ug | ORAL_TABLET | Freq: Every day | ORAL | Status: DC

## 2012-01-04 MED ORDER — AMOXICILLIN 875 MG PO TABS: 875.0000 mg | ORAL_TABLET | Freq: Two times a day (BID) | ORAL | Status: DC

## 2012-01-04 NOTE — Progress Notes (Signed)
  Subjective:    Patient ID: Anne [name], female    DOB: 1940-11-29, 71 y.o.   MRN: [id number]  HPI    Review of Systems  Constitutional: Positive for diaphoresis.  HENT: Negative.   Eyes: Negative.   Respiratory: Negative.   Cardiovascular: Negative.   Gastrointestinal: Negative.   Genitourinary: Negative.   Musculoskeletal: Positive for gait problem.  Skin: Negative.   Neurological: Negative.   Hematological: Negative.   Psychiatric/Behavioral: Negative.        Objective:   Physical Exam        Assessment & Plan:

## 2012-01-04 NOTE — Progress Notes (Signed)
@[facility] @  Patient ID: Anne [name] MRN: [id number], DOB: February 10, 1941, 71 y.o. Date of Encounter: 01/04/2012, 10:29 AM  Primary Physician: No primary provider on file.  Chief Complaint: Physical (CPE)  HPI: 71 y.o. y/o female with history of noted below here for CPE.  Patient complains of intermittent swelling and drainage above tooth number 8.  This is a chronic problem dating back years.  She needs to address the rising BP associated with weight gain She also needs thyroid checked.  Last pap 10/12: normal MMG: 08/2011 Review of Systems: Consitutional: No fever, chills, fatigue, night sweats, lymphadenopathy, or weight changes. Eyes: No visual changes, eye redness, or discharge. ENT/Mouth: Ears: No otalgia, tinnitus, hearing loss, discharge. Nose: No congestion, rhinorrhea, sinus pain, or epistaxis. Throat: No sore throat, post nasal drip, or teeth pain. She is concerned about the gum above tooth number 8 Cardiovascular: No CP, palpitations, diaphoresis, DOE, edema, orthopnea, PND. Respiratory: No cough, hemoptysis, SOB, or wheezing. Gastrointestinal: No anorexia, dysphagia, reflux, pain, nausea, vomiting, hematemesis, diarrhea, constipation, BRBPR, or melena. Breast: No discharge, pain, swelling, or mass. Genitourinary: No dysuria, frequency, urgency, hematuria, incontinence, nocturia, amenorrhea, vaginal discharge, pruritis, burning, abnormal bleeding, or pain. Musculoskeletal: No decreased ROM, myalgias, stiffness, joint swelling, or weakness. Skin: No rash, erythema, lesion changes, pain, warmth, jaundice, or pruritis. Neurological: No headache, dizziness, syncope, seizures, tremors, memory loss, coordination problems, or paresthesias. Psychological: No anxiety, depression, hallucinations, SI/HI. Endocrine: No fatigue, polydipsia, polyphagia, polyuria, or known diabetes. All other systems were reviewed and are otherwise negative.  Past Medical History  Diagnosis Date  .  [place]   . Arthritis   . Cataract   . Hypertension   . Thyroid disease      Past Surgical History  Procedure Date  . Eye surgery   . Joint replacement     Home Meds:  Prior to Admission medications   Medication Sig Start Date End Date Taking? Authorizing Provider  aspirin 81 MG tablet Take 81 mg by mouth daily.   Yes Historical Provider, MD  calcium carbonate (OS-CAL) 600 MG TABS Take 600 mg by mouth 2 (two) times daily with a meal.   Yes Historical Provider, MD  fish oil-omega-3 fatty acids 1000 MG capsule Take 2 g by mouth daily.   Yes Historical Provider, MD  levothyroxine (SYNTHROID, LEVOTHROID) 100 MCG tablet Take 1 tablet (100 mcg total) by mouth daily. 01/04/12  Yes Elvina Sidle, MD  amoxicillin (AMOXIL) 875 MG tablet Take 1 tablet (875 mg total) by mouth 2 (two) times daily. 01/04/12   Elvina Sidle, MD  losartan-hydrochlorothiazide (HYZAAR) 100-12.5 MG per tablet Take 1 tablet by mouth daily. 01/04/12   Elvina Sidle, MD    Allergies: No Known Allergies  History   Social History  . Marital Status: Married    Spouse Name: N/A    Number of Children: N/A  . Years of Education: N/A   Occupational History  . Not on file.   Social History Main Topics  . Smoking status: Never Smoker   . Smokeless tobacco: Not on file  . Alcohol Use: No  . Drug Use: No  . Sexually Active: Not on file   Other Topics Concern  . Not on file   Social History Narrative  . No narrative on file    Family History  Problem Relation Age of Onset  . Stroke Mother   . Cancer Father   . Cancer Sister   . Miscarriages / Stillbirths Neg Hx   . Cancer Sister   .  Cancer Sister     Physical Exam: Blood pressure 128/100, pulse 67, temperature 98.2 F (36.8 C), temperature source Oral, resp. rate 16, height 5\' 5"  (1.651 m), weight 220 lb (99.791 kg), SpO2 98.00%., Body mass index is 36.61 kg/(m^2). General: Well developed, well nourished, in no acute distress. HEENT:  Normocephalic, atraumatic. Conjunctiva pink, sclera non-icteric. Pupils 2 mm constricting to 1 mm, round, regular, and equally reactive to light and accomodation. EOMI. Internal auditory canal clear. TMs with good cone of light and without pathology. Nasal mucosa pink. Nares are without discharge. No sinus tenderness. Oral mucosa pink. Dentition: 2 mm cherry colored lesion overlying root of  tooth number 8. Pharynx without exudate.   Neck: Supple. Trachea midline. No thyromegaly. Full ROM. No lymphadenopathy. Lungs: Clear to auscultation bilaterally without wheezes, rales, or rhonchi. Breathing is of normal effort and unlabored. Cardiovascular: RRR with S1 S2. No murmurs, rubs, or gallops appreciated. Distal pulses 2+ symmetrically. No carotid or abdominal bruits. Breast: Symmetrical. No masses. Nipples without discharge. Abdomen: Soft, non-tender, non-distended with normoactive bowel sounds. No hepatosplenomegaly or masses. No rebound/guarding. No CVA tenderness. Without hernia Musculoskeletal: Full range of motion and 5/5 strength throughout. Without swelling, atrophy, tenderness, crepitus, or warmth. Extremities without clubbing, cyanosis, or edema. Calves supple.  Mild scoliosis Skin: Warm and moist without erythema, ecchymosis, wounds, or rash. Neuro: A+Ox3. CN II-XII grossly intact. Moves all extremities spontaneously. Full sensation throughout. Normal gait. DTR 2+ throughout upper and lower extremities. Finger to nose intact. Psych:  Responds to questions appropriately with a normal affect.     Assessment/Plan:  71 y.o. y/o female here for CPE - 1. Need for prophylactic vaccination and inoculation against influenza  Flu vaccine greater than or equal to 3yo preservative free IM  2. Hypertension  losartan-hydrochlorothiazide (HYZAAR) 100-12.5 MG per tablet, CBC with Differential, Comprehensive metabolic panel, Lipid panel  3. Hypothyroid  TSH, CBC with Differential, Comprehensive metabolic  panel, Lipid panel  4. Annual physical exam  TSH, CBC with Differential, Comprehensive metabolic panel, Lipid panel, POCT urinalysis dipstick  5. Dental abscess  amoxicillin (AMOXIL) 875 MG tablet  6. Need for prophylactic vaccination against Streptococcus pneumoniae (pneumococcus)  Pneumococcal polysaccharide vaccine 23-valent greater than or equal to 2yo subcutaneous/IM     Signed, Elvina Sidle, MD 01/04/2012 10:29 AM   Note:  Old paper record could not be found for this encounter.

## 2012-01-04 NOTE — Patient Instructions (Signed)
Advance Directives  (My Voice, My Choice)  Advance directives are a means for you to make choices about your health care. It is a way that you may accept or refuse medical treatment if you cannot speak for yourself. An advance directive gives you a way to express your wishes about treatment choices in the event that you cannot speak for yourself. These directives protect your right to make your own health care choices. Some examples of advance directives would be:  · A living will is a prepared document that designates your wishes in the event of a serious illness when you cannot care for yourself.  · A patient advocate designation for health care means you choose someone who knows your wishes and can speak for you, on your behalf, should you not be able to do so yourself. This is often a close friend or family member.  · Think about what is important for you in your life. To what extent do you want machines to keep you alive? How much pain are you willing to accept?  · Decide what types of life-sustaining treatments you would or would not want.  · Name a person to be your advocate who understands all your wishes and is willing and able to carry them out.  · A durable power of attorney for health care is a formal legal agreement with an attorney or legal representative who will be bound to carry out your wishes in the event you are unable to care for or represent yourself. This should be someone you trust to make important medical decisions for you.  · Do Not Resuscitate (DNR) is a request to do nothing in the event that your heart stops. A DNR order is used if you are very ill and not expected to recover. DNR orders are accepted by nearly all caregivers and hospitals.  Most caregiver's offices and hospitals have advance directive forms you can use. You may cancel or change these documents at any time. You must be mentally sound and able to communicate your wishes at the time you fill out these forms.  Regardless of  how you let your final wishes be known in the event of a terminal illness, make sure you discuss them with your family and friends. Copies should be given to your caregiver, your hospital, your advocate or attorney, and to significant others. If you travel, you may want to find out what is legal and binding in the states where you will be. Laws vary from state to state.  Document Released: 04/10/2001 Document Revised: 04/24/2011 Document Reviewed: 10/08/2007  [employer]® Patient Information ©2013 [employer].

## 2012-01-05 ENCOUNTER — Encounter: Payer: Self-pay | Admitting: Family Medicine

## 2012-01-05 DIAGNOSIS — M160 Bilateral primary osteoarthritis of hip: Secondary | ICD-10-CM | POA: Insufficient documentation

## 2012-01-05 DIAGNOSIS — R490 Dysphonia: Secondary | ICD-10-CM | POA: Insufficient documentation

## 2012-01-05 DIAGNOSIS — E785 Hyperlipidemia, unspecified: Secondary | ICD-10-CM | POA: Insufficient documentation

## 2012-01-05 DIAGNOSIS — C73 Malignant neoplasm of thyroid gland: Secondary | ICD-10-CM | POA: Insufficient documentation

## 2012-01-05 LAB — TSH: TSH: 4.051 u[IU]/mL (ref 0.350–4.500)

## 2012-01-19 DIAGNOSIS — D2310 Other benign neoplasm of skin of unspecified eyelid, including canthus: Secondary | ICD-10-CM | POA: Diagnosis not present

## 2012-01-19 DIAGNOSIS — H25019 Cortical age-related cataract, unspecified eye: Secondary | ICD-10-CM | POA: Diagnosis not present

## 2012-01-19 DIAGNOSIS — H43819 Vitreous degeneration, unspecified eye: Secondary | ICD-10-CM | POA: Diagnosis not present

## 2012-01-19 DIAGNOSIS — H524 Presbyopia: Secondary | ICD-10-CM | POA: Diagnosis not present

## 2012-02-29 ENCOUNTER — Ambulatory Visit (INDEPENDENT_AMBULATORY_CARE_PROVIDER_SITE_OTHER): Payer: Medicare Other | Admitting: Family Medicine

## 2012-02-29 ENCOUNTER — Encounter: Payer: Self-pay | Admitting: Family Medicine

## 2012-02-29 VITALS — BP 159/83 | HR 73 | Temp 97.6°F | Resp 16 | Ht 65.0 in | Wt 216.0 lb

## 2012-02-29 DIAGNOSIS — K529 Noninfective gastroenteritis and colitis, unspecified: Secondary | ICD-10-CM

## 2012-02-29 DIAGNOSIS — R197 Diarrhea, unspecified: Secondary | ICD-10-CM

## 2012-02-29 DIAGNOSIS — I10 Essential (primary) hypertension: Secondary | ICD-10-CM

## 2012-02-29 DIAGNOSIS — E039 Hypothyroidism, unspecified: Secondary | ICD-10-CM

## 2012-02-29 DIAGNOSIS — K5289 Other specified noninfective gastroenteritis and colitis: Secondary | ICD-10-CM | POA: Diagnosis not present

## 2012-02-29 LAB — COMPREHENSIVE METABOLIC PANEL
ALT: 20 U/L (ref 0–35)
AST: 24 U/L (ref 0–37)
Albumin: 4.5 g/dL (ref 3.5–5.2)
Alkaline Phosphatase: 63 U/L (ref 39–117)
BUN: 5 mg/dL — ABNORMAL LOW (ref 6–23)
CO2: 25 mEq/L (ref 19–32)
Calcium: 9.3 mg/dL (ref 8.4–10.5)
Chloride: 103 mEq/L (ref 96–112)
Creat: 0.65 mg/dL (ref 0.50–1.10)
Glucose, Bld: 96 mg/dL (ref 70–99)
Potassium: 3.8 mEq/L (ref 3.5–5.3)
Sodium: 142 mEq/L (ref 135–145)
Total Bilirubin: 0.5 mg/dL (ref 0.3–1.2)
Total Protein: 7.1 g/dL (ref 6.0–8.3)

## 2012-02-29 LAB — CBC
HCT: 37.8 % (ref 36.0–46.0)
Hemoglobin: 13.3 g/dL (ref 12.0–15.0)
MCH: 31.9 pg (ref 26.0–34.0)
MCHC: 35.2 g/dL (ref 30.0–36.0)
MCV: 90.6 fL (ref 78.0–100.0)
Platelets: 217 10*3/uL (ref 150–400)
RBC: 4.17 MIL/uL (ref 3.87–5.11)
RDW: 14.3 % (ref 11.5–15.5)
WBC: 3.5 10*3/uL — ABNORMAL LOW (ref 4.0–10.5)

## 2012-02-29 LAB — TSH: TSH: 2.738 u[IU]/mL (ref 0.350–4.500)

## 2012-02-29 MED ORDER — METRONIDAZOLE 250 MG PO TABS: 250.0000 mg | ORAL_TABLET | Freq: Three times a day (TID) | ORAL | Status: DC

## 2012-02-29 NOTE — Patient Instructions (Addendum)
Hypertension As your heart beats, it forces blood through your arteries. This force is your blood pressure. If the pressure is too high, it is called hypertension (HTN) or high blood pressure. HTN is dangerous because you may have it and not know it. High blood pressure may mean that your heart has to work harder to pump blood. Your arteries may be narrow or stiff. The extra work puts you at risk for heart disease, stroke, and other problems.  Blood pressure consists of two numbers, a higher number over a lower, 110/72, for example. It is stated as "110 over 72." The ideal is below 120 for the top number (systolic) and under 80 for the bottom (diastolic). Write down your blood pressure today. You should pay close attention to your blood pressure if you have certain conditions such as:  Heart failure.  Prior heart attack.  Diabetes  Chronic kidney disease.  Prior stroke.  Multiple risk factors for heart disease. To see if you have HTN, your blood pressure should be measured while you are seated with your arm held at the level of the heart. It should be measured at least twice. A one-time elevated blood pressure reading (especially in the Emergency Department) does not mean that you need treatment. There may be conditions in which the blood pressure is different between your right and left arms. It is important to see your caregiver soon for a recheck. Most people have essential hypertension which means that there is not a specific cause. This type of high blood pressure may be lowered by changing lifestyle factors such as:  Stress.  Smoking.  Lack of exercise.  Excessive weight.  Drug/tobacco/alcohol use.  Eating less salt. Most people do not have symptoms from high blood pressure until it has caused damage to the body. Effective treatment can often prevent, delay or reduce that damage. TREATMENT  When a cause has been identified, treatment for high blood pressure is directed at the  cause. There are a large number of medications to treat HTN. These fall into several categories, and your caregiver will help you select the medicines that are best for you. Medications may have side effects. You should review side effects with your caregiver. If your blood pressure stays high after you have made lifestyle changes or started on medicines,   Your medication(s) may need to be changed.  Other problems may need to be addressed.  Be certain you understand your prescriptions, and know how and when to take your medicine.  Be sure to follow up with your caregiver within the time frame advised (usually within two weeks) to have your blood pressure rechecked and to review your medications.  If you are taking more than one medicine to lower your blood pressure, make sure you know how and at what times they should be taken. Taking two medicines at the same time can result in blood pressure that is too low. SEEK IMMEDIATE MEDICAL CARE IF:  You develop a severe headache, blurred or changing vision, or confusion.  You have unusual weakness or numbness, or a faint feeling.  You have severe chest or abdominal pain, vomiting, or breathing problems. MAKE SURE YOU:   Understand these instructions.  Will watch your condition.  Will get help right away if you are not doing well or get worse. Document Released: 01/30/2005 Document Revised: 04/24/2011 Document Reviewed: 09/20/2007 [facility] Patient Information 2013 [place], [place]. Viral Gastroenteritis Viral gastroenteritis is also known as stomach flu. This condition affects the stomach and  intestinal tract. It can cause sudden diarrhea and vomiting. The illness typically lasts 3 to 8 days. Most people develop an immune response that eventually gets rid of the virus. While this natural response develops, the virus can make you quite ill. CAUSES  Many different viruses can cause gastroenteritis, such as rotavirus or noroviruses. You can catch  one of these viruses by consuming contaminated food or water. You may also catch a virus by sharing utensils or other personal items with an infected person or by touching a contaminated surface. SYMPTOMS  The most common symptoms are diarrhea and vomiting. These problems can cause a severe loss of body fluids (dehydration) and a body salt (electrolyte) imbalance. Other symptoms may include:  Fever.  Headache.  Fatigue.  Abdominal pain. DIAGNOSIS  Your caregiver can usually diagnose viral gastroenteritis based on your symptoms and a physical exam. A stool sample may also be taken to test for the presence of viruses or other infections. TREATMENT  This illness typically goes away on its own. Treatments are aimed at rehydration. The most serious cases of viral gastroenteritis involve vomiting so severely that you are not able to keep fluids down. In these cases, fluids must be given through an intravenous line (IV). HOME CARE INSTRUCTIONS   Drink enough fluids to keep your urine clear or pale yellow. Drink small amounts of fluids frequently and increase the amounts as tolerated.  Ask your caregiver for specific rehydration instructions.  Avoid:  Foods high in sugar.  Alcohol.  Carbonated drinks.  Tobacco.  Juice.  Caffeine drinks.  Extremely hot or cold fluids.  Fatty, greasy foods.  Too much intake of anything at one time.  Dairy products until 24 to 48 hours after diarrhea stops.  You may consume probiotics. Probiotics are active cultures of beneficial bacteria. They may lessen the amount and number of diarrheal stools in adults. Probiotics can be found in yogurt with active cultures and in supplements.  Wash your hands well to avoid spreading the virus.  Only take over-the-counter or prescription medicines for pain, discomfort, or fever as directed by your caregiver. Do not give aspirin to children. Antidiarrheal medicines are not recommended.  Ask your caregiver if  you should continue to take your regular prescribed and over-the-counter medicines.  Keep all follow-up appointments as directed by your caregiver. SEEK IMMEDIATE MEDICAL CARE IF:   You are unable to keep fluids down.  You do not urinate at least once every 6 to 8 hours.  You develop shortness of breath.  You notice blood in your stool or vomit. This may look like coffee grounds.  You have abdominal pain that increases or is concentrated in one small area (localized).  You have persistent vomiting or diarrhea.  You have a fever.  The patient is a child younger than 3 months, and he or she has a fever.  The patient is a child older than 3 months, and he or she has a fever and persistent symptoms.  The patient is a child older than 3 months, and he or she has a fever and symptoms suddenly get worse.  The patient is a baby, and he or she has no tears when crying. MAKE SURE YOU:   Understand these instructions.  Will watch your condition.  Will get help right away if you are not doing well or get worse. Document Released: 01/30/2005 Document Revised: 04/24/2011 Document Reviewed: 11/16/2010 [facility] Patient Information 2013 [place], [place].

## 2012-02-29 NOTE — Progress Notes (Signed)
72 yo woman who has not been feeling well for 8 days.  Her epigastrium was "pulling" and has boborygmi.  She feels a little weak "in my eyes."  No cough. Does feel like she has some thick phlegm in upper airways. No fever Had some loose stools one week ago. Altered diet to include just fruit and juice   Obje:  Alert, good eye contact No jaundice. Chest: clear Heart: reg, no murmur Abdomen:  Hyperactive BS, no HSM, no masses, ;nontender  Assessment;  gastroenteritis  1. Gastroenteritis  metroNIDAZOLE (FLAGYL) 250 MG tablet   Also, patient's pressure is up and she has some aching in the left hip.  No headache or chest pain. Patient notes (with husband present) that she does not sleep well.  Instead, she naps intermittently during the day and sleeps perhaps 4 hours at night.  Objective:  See above BP recheck 160/90  Assessment:  Overweight, hypertensive.  I definitely want the patient's BP down.  I believe we can wait for the blood tests below before adding amlodipine.  Plan:  Check TSH, CMET, CBC I will call patient tomorrow for follow up.

## 2012-11-25 ENCOUNTER — Other Ambulatory Visit: Payer: Self-pay

## 2012-11-25 DIAGNOSIS — Z1231 Encounter for screening mammogram for malignant neoplasm of breast: Secondary | ICD-10-CM

## 2012-11-29 ENCOUNTER — Ambulatory Visit (INDEPENDENT_AMBULATORY_CARE_PROVIDER_SITE_OTHER): Payer: Medicare Other | Admitting: Family Medicine

## 2012-11-29 VITALS — BP 140/86 | HR 70 | Temp 97.7°F | Resp 16 | Ht 65.0 in | Wt 213.0 lb

## 2012-11-29 DIAGNOSIS — N898 Other specified noninflammatory disorders of vagina: Secondary | ICD-10-CM | POA: Diagnosis not present

## 2012-11-29 LAB — POCT WET PREP WITH KOH
KOH Prep POC: POSITIVE
Trichomonas, UA: NEGATIVE

## 2012-11-29 MED ORDER — FLUCONAZOLE 150 MG PO TABS: 150.0000 mg | ORAL_TABLET | Freq: Once | ORAL | Status: DC

## 2012-11-29 NOTE — Progress Notes (Signed)
[facility] [address], [place] [address] [phone]- 0000  Date:  11/29/2012   Name:  Anne [name]   DOB:  1941-01-29   MRN:  [id number]  PCP:  No primary provider on file.    Chief Complaint: Vaginal Discharge   History of Present Illness:  Anne [name] is a 72 y.o. very pleasant female patient who presents with the following:  She has noted vaginal discomfort for about one week.  The problem seems to come and go.  She has some itching, no burning.  She feels like the problem is on the external genitals.   She has noted some thick white discharge.   She has not noted any urinary sx.    No fever.  She did possibly see a lesion on her labia that she would like for me to check. She had a flu shot 2 weeks ago.   She has not had a hysterectomy.   No vaginal bleeding.    Patient Active Problem List   Diagnosis Date Noted  . Thyroid cancer 01/05/2012  . Hyperlipidemia 01/05/2012  . Dysphonia 01/05/2012  . 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 Hypertension 01/04/2012  . Hypothyroid 01/04/2012    Past Medical History  Diagnosis Date  . Comedone   . Arthritis   . Cataract   . Hypertension   . Thyroid disease     Past Surgical History  Procedure Laterality Date  . Eye surgery    . Joint replacement    . Replacement total hip w/  resurfacing implants  2010  . Tonsillectomy and adenoidectomy    . Cataracts    . Thyroidectomy      History  Substance Use Topics  . Smoking status: Never Smoker   . Smokeless tobacco: Not on file  . Alcohol Use: No    Family History  Problem Relation Age of Onset  . Stroke Mother   . Cancer Father   . Cancer Sister   . Miscarriages / Stillbirths Neg Hx   . Cancer Sister   . Cancer Sister     No Known Allergies  Medication list has been reviewed and updated.  Current Outpatient Prescriptions on File Prior to Visit  Medication Sig Dispense Refill  . amoxicillin (AMOXIL) 875 MG tablet Take  1 tablet (875 mg total) by mouth 2 (two) times daily.  20 tablet  2  . aspirin 81 MG tablet Take 81 mg by mouth daily.      . calcium carbonate (OS-CAL) 600 MG TABS Take 600 mg by mouth 2 (two) times daily with a meal.      . fish oil-omega-3 fatty acids 1000 MG capsule Take 2 g by mouth daily.      Marland Kitchen levothyroxine (SYNTHROID, LEVOTHROID) 100 MCG tablet Take 1 tablet (100 mcg total) by mouth daily.  90 tablet  3  . losartan-hydrochlorothiazide (HYZAAR) 100-12.5 MG per tablet Take 1 tablet by mouth daily.  90 tablet  3   No current facility-administered medications on file prior to visit.    Review of Systems:  As per HPI- otherwise negative.   Physical Examination: Filed Vitals:   11/29/12 0827  BP: 140/86  Pulse: 70  Temp: 97.7 F (36.5 C)  Resp: 16   Filed Vitals:   11/29/12 0827  Height: 5\' 5"  (1.651 m)  Weight: 213 lb (96.616 kg)   Body mass index is 35.44 kg/(m^2). Ideal Body Weight: Weight in (lb) to have BMI =  25: 149.9  GEN: WDWN, NAD, Non-toxic, A & O x 3, overweight, looks well HEENT: Atraumatic, Normocephalic. Neck supple. No masses, No LAD. Ears and Nose: No external deformity. CV: RRR, No M/G/R. No JVD. No thrill. No extra heart sounds. PULM: CTA B, no wheezes, crackles, rhonchi. No retractions. No resp. distress. No accessory muscle use. ABD: S, NT, ND, +BS. No rebound. No HSM. EXTR: No c/c/e NEURO Normal gait.  PSYCH: Normally interactive. Conversant. Not depressed or anxious appearing.  Calm demeanor.  Gu: normal exam except slight inflammation of the external labia.  Area that she was concerned about is her urethra- reassured.  No cervical lesions or CMT.  No adnexal masses or tenderness   Results for orders placed in visit on 11/29/12  POCT WET PREP WITH KOH      Result Value Range   Trichomonas, UA Negative     Clue Cells Wet Prep HPF POC 0-1     Epithelial Wet Prep HPF POC 0-3     Yeast Wet Prep HPF POC neg     Bacteria Wet Prep HPF POC 2+      RBC Wet Prep HPF POC 0-1     WBC Wet Prep HPF POC 0-1     KOH Prep POC Positive      Assessment and Plan: Vaginal discharge - Plan: POCT Wet Prep with KOH, fluconazole (DIFLUCAN) 150 MG tablet  Yeast vaginitis- treat with diflucan.  Let me know if not better  Signed Abbe Amsterdam, MD

## 2012-11-29 NOTE — Patient Instructions (Signed)
Take 1 diflucan pill for yeast infection.   Repeat in one week if needed.

## 2012-12-10 ENCOUNTER — Ambulatory Visit
Admission: RE | Admit: 2012-12-10 | Discharge: 2012-12-10 | Disposition: A | Discharge status: Home / Self Care | Payer: Medicare Other | Source: Ambulatory Visit

## 2012-12-10 DIAGNOSIS — Z1231 Encounter for screening mammogram for malignant neoplasm of breast: Secondary | ICD-10-CM | POA: Diagnosis not present

## 2012-12-31 ENCOUNTER — Other Ambulatory Visit: Payer: Self-pay | Admitting: Family Medicine

## 2013-01-16 ENCOUNTER — Ambulatory Visit (INDEPENDENT_AMBULATORY_CARE_PROVIDER_SITE_OTHER): Payer: Medicare Other | Admitting: Family Medicine

## 2013-01-16 ENCOUNTER — Encounter: Payer: Self-pay | Admitting: Family Medicine

## 2013-01-16 VITALS — BP 182/96 | HR 70 | Temp 97.6°F | Resp 16 | Ht 66.0 in | Wt 216.2 lb

## 2013-01-16 DIAGNOSIS — Z0000 Encounter for general adult medical examination without abnormal findings: Secondary | ICD-10-CM | POA: Diagnosis not present

## 2013-01-16 DIAGNOSIS — E039 Hypothyroidism, unspecified: Secondary | ICD-10-CM

## 2013-01-16 DIAGNOSIS — I10 Essential (primary) hypertension: Secondary | ICD-10-CM

## 2013-01-16 DIAGNOSIS — R209 Unspecified disturbances of skin sensation: Secondary | ICD-10-CM

## 2013-01-16 DIAGNOSIS — Q670 Congenital facial asymmetry: Secondary | ICD-10-CM

## 2013-01-16 DIAGNOSIS — Q674 Other congenital deformities of skull, face and jaw: Secondary | ICD-10-CM

## 2013-01-16 DIAGNOSIS — N390 Urinary tract infection, site not specified: Secondary | ICD-10-CM

## 2013-01-16 DIAGNOSIS — R202 Paresthesia of skin: Secondary | ICD-10-CM

## 2013-01-16 LAB — COMPREHENSIVE METABOLIC PANEL
ALT: 21 U/L (ref 0–35)
AST: 24 U/L (ref 0–37)
Albumin: 4.4 g/dL (ref 3.5–5.2)
Alkaline Phosphatase: 69 U/L (ref 39–117)
BUN: 13 mg/dL (ref 6–23)
CO2: 27 mEq/L (ref 19–32)
Calcium: 9.6 mg/dL (ref 8.4–10.5)
Chloride: 101 mEq/L (ref 96–112)
Creat: 0.7 mg/dL (ref 0.50–1.10)
Glucose, Bld: 111 mg/dL — ABNORMAL HIGH (ref 70–99)
Potassium: 4.2 mEq/L (ref 3.5–5.3)
Sodium: 139 mEq/L (ref 135–145)
Total Bilirubin: 0.5 mg/dL (ref 0.3–1.2)
Total Protein: 7.5 g/dL (ref 6.0–8.3)

## 2013-01-16 LAB — CBC WITH DIFFERENTIAL/PLATELET
Basophils Absolute: 0 10*3/uL (ref 0.0–0.1)
Basophils Relative: 1 % (ref 0–1)
Eosinophils Absolute: 0.1 10*3/uL (ref 0.0–0.7)
Eosinophils Relative: 2 % (ref 0–5)
HCT: 37.7 % (ref 36.0–46.0)
Hemoglobin: 13.4 g/dL (ref 12.0–15.0)
Lymphocytes Relative: 41 % (ref 12–46)
Lymphs Abs: 1.6 10*3/uL (ref 0.7–4.0)
MCH: 32.3 pg (ref 26.0–34.0)
MCHC: 35.5 g/dL (ref 30.0–36.0)
MCV: 90.8 fL (ref 78.0–100.0)
Monocytes Absolute: 0.3 10*3/uL (ref 0.1–1.0)
Monocytes Relative: 8 % (ref 3–12)
Neutro Abs: 1.9 10*3/uL (ref 1.7–7.7)
Neutrophils Relative %: 48 % (ref 43–77)
Platelets: 244 10*3/uL (ref 150–400)
RBC: 4.15 MIL/uL (ref 3.87–5.11)
RDW: 14.6 % (ref 11.5–15.5)
WBC: 3.9 10*3/uL — ABNORMAL LOW (ref 4.0–10.5)

## 2013-01-16 LAB — POCT URINALYSIS DIPSTICK
Bilirubin, UA: NEGATIVE
Glucose, UA: NEGATIVE
Ketones, UA: NEGATIVE
Nitrite, UA: POSITIVE
Protein, UA: NEGATIVE
Spec Grav, UA: 1.02
Urobilinogen, UA: 0.2
pH, UA: 5.5

## 2013-01-16 LAB — SEDIMENTATION RATE: Sed Rate: 22 mm/hr (ref 0–22)

## 2013-01-16 LAB — LIPID PANEL
Cholesterol: 230 mg/dL — ABNORMAL HIGH (ref 0–200)
HDL: 50 mg/dL (ref >39)
LDL Cholesterol: 141 mg/dL — ABNORMAL HIGH (ref 0–99)
Total CHOL/HDL Ratio: 4.6 Ratio
Triglycerides: 193 mg/dL — ABNORMAL HIGH (ref <150)
VLDL: 39 mg/dL (ref 0–40)

## 2013-01-16 LAB — POCT UA - MICROSCOPIC ONLY
Casts, Ur, LPF, POC: NEGATIVE
Crystals, Ur, HPF, POC: NEGATIVE
Yeast, UA: NEGATIVE

## 2013-01-16 LAB — TSH: TSH: 4.121 u[IU]/mL (ref 0.350–4.500)

## 2013-01-16 LAB — VITAMIN B12: Vitamin B-12: 312 pg/mL (ref 211–911)

## 2013-01-16 MED ORDER — LOSARTAN POTASSIUM-HCTZ 100-12.5 MG PO TABS: 1.0000 | ORAL_TABLET | Freq: Every day | ORAL | Status: DC

## 2013-01-16 MED ORDER — LEVOTHYROXINE SODIUM 100 MCG PO TABS: 100.0000 ug | ORAL_TABLET | Freq: Every day | ORAL | Status: DC

## 2013-01-16 NOTE — Progress Notes (Signed)
Patient ID: Anne [name] MRN: [id number], DOB: 1940/09/10, 72 y.o. Date of Encounter: 01/16/2013, 10:24 AM  Primary Physician: No primary provider on file.  Chief Complaint: Physical (CPE)  HPI: 72 y.o. y/o female with history of noted below here for CPE.  Doing well. Complains of over a month of paresthesias in arms L>R and upper trapezius soreness.  Also having intermittent right facial drawing over past several months. No diplopia   Pap: MMG: Review of Systems: Consitutional: No fever, chills, fatigue, night sweats, lymphadenopathy, or weight changes. Eyes: No visual changes, eye redness, or discharge. ENT/Mouth: Ears: No otalgia, tinnitus, hearing loss, discharge. Nose: No congestion, rhinorrhea, sinus pain, or epistaxis. Throat: No sore throat, post nasal drip, or teeth pain. Cardiovascular: No CP, palpitations, diaphoresis, DOE, edema, orthopnea, PND. Respiratory: No cough, hemoptysis, SOB, or wheezing. Gastrointestinal: No anorexia, dysphagia, reflux, pain, nausea, vomiting, hematemesis, diarrhea, constipation, BRBPR, or melena. Breast: No discharge, pain, swelling, or mass. Genitourinary: No dysuria, frequency, urgency, hematuria, incontinence, nocturia, amenorrhea, vaginal discharge, pruritis, burning, abnormal bleeding, or pain. Musculoskeletal: No decreased ROM, myalgias, stiffness, joint swelling, or weakness. Skin: No rash, erythema, lesion changes, pain, warmth, jaundice, or pruritis. Neurological: No headache, dizziness, syncope, seizures, tremors, memory loss, coordination problems, or paresthesias. Psychological: No anxiety, depression, hallucinations, SI/HI. Endocrine: No fatigue, polydipsia, polyphagia, polyuria, or known diabetes. All other systems were reviewed and are otherwise negative.  Past Medical History  Diagnosis Date  . Comedone   . Arthritis   . Cataract   . Hypertension   . Thyroid disease      Past Surgical History  Procedure Laterality  Date  . Eye surgery    . Joint replacement    . Replacement total hip w/  resurfacing implants  2010  . Tonsillectomy and adenoidectomy    . Cataracts    . Thyroidectomy      Home Meds:  Prior to Admission medications   Medication Sig Start Date End Date Taking? Authorizing Provider  Acetaminophen (TYLENOL PO) Take by mouth as needed.   Yes Historical Provider, MD  aspirin 81 MG tablet Take 81 mg by mouth daily.   Yes Historical Provider, MD  calcium carbonate (OS-CAL) 600 MG TABS Take 600 mg by mouth 2 (two) times daily with a meal.   Yes Historical Provider, MD  fish oil-omega-3 fatty acids 1000 MG capsule Take 2 g by mouth daily.   Yes Historical Provider, MD  fluconazole (DIFLUCAN) 150 MG tablet Take 1 tablet (150 mg total) by mouth once. 11/29/12  Yes Gwenlyn Found [name], MD  levothyroxine (SYNTHROID, LEVOTHROID) 100 MCG tablet Take 1 tablet (100 mcg total) by mouth daily. 01/04/12  Yes Elvina Sidle, MD  losartan-hydrochlorothiazide (HYZAAR) 100-12.5 MG per tablet TAKE 1 TABLET BY MOUTH DAILY. 12/31/12  Yes Godfrey Pick, PA-C  Multiple Vitamin (MULTI VITAMIN DAILY PO) Take by mouth daily.   Yes Historical Provider, MD    Allergies: No Known Allergies  History   Social History  . Marital Status: Married    Spouse Name: N/A    Number of Children: N/A  . Years of Education: N/A   Occupational History  . Not on file.   Social History Main Topics  . Smoking status: Never Smoker   . Smokeless tobacco: Not on file  . Alcohol Use: No  . Drug Use: No  . Sexual Activity: Not on file   Other Topics Concern  . Not on file   Social History Narrative  . No narrative  on file    Family History  Problem Relation Age of Onset  . Stroke Mother   . Cancer Father   . Cancer Sister   . Miscarriages / Stillbirths Neg Hx   . Cancer Sister   . Cancer Sister     Physical Exam:  150/80 Blood pressure 182/96, pulse 70, temperature 97.6 F (36.4 C), temperature source Oral,  resp. rate 16, height 5\' 6"  (1.676 m), weight 216 lb 3.2 oz (98.068 kg), SpO2 97.00%., Body mass index is 34.91 kg/(m^2). General: Well developed, well nourished, in no acute distress. HEENT: Normocephalic, atraumatic. Conjunctiva pink, sclera non-icteric. Pupils 2 mm constricting to 1 mm, round, regular, and equally reactive to light and accomodation. EOMI. Internal auditory canal clear. TMs with good cone of light and without pathology. Nasal mucosa pink. Nares are without discharge. No sinus tenderness. Oral mucosa pink. Dentition fair. Pharynx without exudate.   Neck: Supple. Trachea midline. No thyromegaly. Full ROM. No lymphadenopathy. Lungs: Clear to auscultation bilaterally without wheezes, rales, or rhonchi. Breathing is of normal effort and unlabored. Cardiovascular: RRR with S1 S2. No murmurs, rubs, or gallops appreciated. Distal pulses 2+ symmetrically. No carotid or abdominal bruits. Breast: Symmetrical. No masses. Nipples without discharge. Abdomen: Soft, non-tender, non-distended with normoactive bowel sounds. No hepatosplenomegaly or masses. No rebound/guarding. No CVA tenderness. Without hernias.  Musculoskeletal: Full range of motion and 5/5 strength throughout. Without swelling, atrophy, tenderness, crepitus, or warmth. Extremities without clubbing, cyanosis, or edema. Calves supple. Skin: Warm and moist without erythema, ecchymosis, wounds, or rash. Neuro: A+Ox3. CN II-XII grossly intact. Moves all extremities spontaneously. Full sensation throughout. Normal gait. DTR 2+ throughout upper and lower extremities. Finger to nose intact. Psych:  Responds to questions appropriately with a normal affect.   Studies: CBC, CMET, Lipid, TSH, Vitamin D all pending. Patient is  UA:   Assessment/Plan:  72 y.o. y/o female here for CPE Routine general medical examination at a health care facility - Plan: POCT UA - Microscopic Only, POCT urinalysis dipstick, Comprehensive metabolic panel, TSH,  POCT SEDIMENTATION RATE  Paresthesias - Plan: Comprehensive metabolic panel, TSH, POCT SEDIMENTATION RATE, Ambulatory referral to Neurology  Facial asymmetries - Plan: POCT SEDIMENTATION RATE, Ambulatory referral to Neurology  Hypertension - Plan: Ambulatory referral to Cardiology, losartan-hydrochlorothiazide (HYZAAR) 100-12.5 MG per tablet  Unspecified hypothyroidism - Plan: levothyroxine (SYNTHROID, LEVOTHROID) 100 MCG tablet   -  Signed, Elvina Sidle, MD 01/16/2013 10:24 AM

## 2013-01-18 LAB — URINE CULTURE: Colony Count: >=100000

## 2013-01-19 ENCOUNTER — Other Ambulatory Visit: Payer: Self-pay | Admitting: Family Medicine

## 2013-01-19 DIAGNOSIS — N390 Urinary tract infection, site not specified: Secondary | ICD-10-CM

## 2013-01-19 MED ORDER — CIPROFLOXACIN HCL 250 MG PO TABS: 250.0000 mg | ORAL_TABLET | Freq: Two times a day (BID) | ORAL | Status: DC

## 2013-01-23 ENCOUNTER — Encounter: Payer: Self-pay | Admitting: Neurology

## 2013-01-23 ENCOUNTER — Ambulatory Visit (INDEPENDENT_AMBULATORY_CARE_PROVIDER_SITE_OTHER): Payer: Medicare Other | Admitting: Neurology

## 2013-01-23 ENCOUNTER — Encounter (INDEPENDENT_AMBULATORY_CARE_PROVIDER_SITE_OTHER): Payer: Self-pay

## 2013-01-23 VITALS — BP 149/67 | HR 72 | Ht 66.0 in | Wt 217.0 lb

## 2013-01-23 DIAGNOSIS — Q674 Other congenital deformities of skull, face and jaw: Secondary | ICD-10-CM

## 2013-01-23 DIAGNOSIS — R209 Unspecified disturbances of skin sensation: Secondary | ICD-10-CM

## 2013-01-23 DIAGNOSIS — R202 Paresthesia of skin: Secondary | ICD-10-CM

## 2013-01-23 DIAGNOSIS — Q670 Congenital facial asymmetry: Secondary | ICD-10-CM

## 2013-01-23 NOTE — Progress Notes (Signed)
Subjective:    Patient ID: Anne [name] is a 72 y.o. female.  HPI    Anne Foley, MD, PhD [facility] [address] [address] [place], [place] [address]  Dear Dr. Milus Glazier,   I saw your patient, Anne [name], upon your kind request in my neurologic clinic today for initial consultation of her paresthesias and facial asymmetry for the past 7-8 months, intermittent. The patient is accompanied by her husband today. As you know, [name] is a very pleasant 72 year old right-handed woman with an underlying medical history of hyperlipidemia, osteoarthritis, hypertension, hypothyroidism, and thyroid tumor, who, over the past months, has noted intermittent paresthesias in her upper extremities and to a lesser degree in her lower extremities, and intermittent drawing sensation in her face. The mouth tends to draw over to the R, and she does not feel it at the time, but her husband notices it. It is intermittent and happens not daily, maybe 1/week and it lasts for seconds. She had a thyroid tumor removed with a complete L thyroidectomy in 2011 and is s/p bilateral THR in 2009 and 2010, respectively.  She had blood work in your office on 01/16/13, which showed a low N B12, normal TSH, elevated LDL at 141, and she was treated for a UTI and is still on cipro for this. Never had TIA or stroke symptoms, denying sudden onset of one sided weakness, numbness, tingling, slurring of speech or droopy face, hearing loss, tinnitus, diplopia or visual field cut or monocular loss of vision, and denies recurrent headaches.  She had a brain MRI w/wo contrast on 06/24/09 for Sx of paresthesias at the time: No acute or reversible process. Minimal small vessel change of the hemispheric white matter. See above discussion. I personally reviewed the images through the PACS system. She also had a C spine MRI, for whole body paresthesias reported, which showed: Degenerative changes throughout the  cervical spine with most notable finding a large extruded disc posterior to the C6 vertebra with mass effect as described above. No definitive cord signal abnormality as discussed above. Enlarged thyroid gland. Ultrasound recommended for further delineation. I reviewed the films as well. She had thyroid surgery in 11/11.  Her Past Medical History Is Significant For: Past Medical History  Diagnosis Date  . Comedone   . Arthritis   . Cataract   . Hypertension   . Thyroid disease     Her Past Surgical History Is Significant For: Past Surgical History  Procedure Laterality Date  . Eye surgery    . Joint replacement    . Replacement total hip w/  resurfacing implants  2010  . Tonsillectomy and adenoidectomy    . Cataracts    . Thyroidectomy      Her Family History Is Significant For: Family History  Problem Relation Age of Onset  . Stroke Mother   . Cancer Father   . Cancer Sister   . Miscarriages / Stillbirths Neg Hx   . Cancer Sister   . Cancer Sister     Her Social History Is Significant For: History   Social History  . Marital Status: Married    Spouse Name: N/A    Number of Children: N/A  . Years of Education: N/A   Social History Main Topics  . Smoking status: Never Smoker   . Smokeless tobacco: None  . Alcohol Use: No  . Drug Use: No  . Sexual Activity: None   Other Topics Concern  .  None   Social History Narrative  . None    Her Allergies Are:  No Known Allergies:   Her Current Medications Are:  Outpatient Encounter Prescriptions as of 01/23/2013  Medication Sig  . Acetaminophen (TYLENOL PO) Take by mouth as needed.  Marland Kitchen aspirin 81 MG tablet Take 81 mg by mouth daily.  . calcium carbonate (OS-CAL) 600 MG TABS Take 600 mg by mouth 2 (two) times daily with a meal.  . ciprofloxacin (CIPRO) 250 MG tablet Take 1 tablet (250 mg total) by mouth 2 (two) times daily.  . fish oil-omega-3 fatty acids 1000 MG capsule Take 2 g by mouth daily.  Marland Kitchen levothyroxine  (SYNTHROID, LEVOTHROID) 100 MCG tablet Take 1 tablet (100 mcg total) by mouth daily.  Marland Kitchen losartan-hydrochlorothiazide (HYZAAR) 100-12.5 MG per tablet Take 1 tablet by mouth daily.  . Multiple Vitamin (MULTI VITAMIN DAILY PO) Take by mouth daily.  . [DISCONTINUED] fluconazole (DIFLUCAN) 150 MG tablet Take 1 tablet (150 mg total) by mouth once.   Review of Systems:  Out of a complete 14 point review of systems, all are reviewed and negative with the exception of these symptoms as listed below:   Review of Systems  Constitutional: Positive for chills and fatigue.  HENT: Negative.   Eyes: Positive for redness.  Respiratory: Positive for shortness of breath.   Cardiovascular: Negative.   Gastrointestinal: Negative.   Endocrine: Positive for cold intolerance and heat intolerance.  Genitourinary: Negative.   Musculoskeletal: Positive for arthralgias.  Skin: Negative.   Allergic/Immunologic: Negative.   Neurological: Positive for weakness and numbness.       Restless leg  Hematological: Negative.   Psychiatric/Behavioral: Positive for sleep disturbance.    Objective:  Neurologic Exam  Physical Exam Physical Examination:   Filed Vitals:   01/23/13 1408  BP: 149/67  Pulse: 72   General Examination: The patient is a very pleasant 72 y.o. female in no acute distress. She appears well-developed and well-nourished and well groomed.   HEENT: Normocephalic, atraumatic, pupils are equal, round and reactive to light and accommodation. Funduscopic exam is normal with sharp disc margins noted. Extraocular tracking is good without limitation to gaze excursion or nystagmus noted. Normal smooth pursuit is noted. Hearing is grossly intact. Tympanic membranes are clear bilaterally. Face is symmetric with normal facial animation and normal facial sensation. Speech is clear with no dysarthria noted. There is no hypophonia. There is no lip, neck/head, jaw or voice tremor. Neck is supple with full range of  passive and active motion. There are no carotid bruits on auscultation. Oropharynx exam reveals: moderate mouth dryness, adequate dental hygiene and moderate airway crowding, due to large tongue. Mallampati is class II. Tongue protrudes centrally and palate elevates symmetrically. Tonsils are absent.    Chest: Clear to auscultation without wheezing, rhonchi or crackles noted.  Heart: S1+S2+0, regular and normal without murmurs, rubs or gallops noted.   Abdomen: Soft, non-tender and non-distended with normal bowel sounds appreciated on auscultation.  Extremities: There is trace pitting edema in the distal lower extremities bilaterally. Pedal pulses are intact.  Skin: Warm and dry without trophic changes noted. There are no varicose veins.  Musculoskeletal: exam reveals no obvious joint deformities, tenderness or joint swelling or erythema.   Neurologically:  Mental status: The patient is awake, alert and oriented in all 4 spheres. Her memory, attention, language and knowledge are appropriate. There is no aphasia, agnosia, apraxia or anomia. Speech is clear with normal prosody and enunciation. Thought process is linear.  Mood is congruent and affect is normal.  Cranial nerves are as described above under HEENT exam. In addition, shoulder shrug is normal with equal shoulder height noted. Motor exam: Normal bulk, strength and tone is noted. There is no drift, tremor or rebound. Romberg is negative. Reflexes are 2+ throughout. Toes are downgoing bilaterally. Fine motor skills are intact with normal finger taps, normal hand movements, normal rapid alternating patting, normal foot taps and normal foot agility.  Cerebellar testing shows no dysmetria or intention tremor on finger to nose testing. Heel to shin is difficult for her d/t bilateral hip surgeries. There is no truncal or gait ataxia.  Sensory exam is intact to light touch, pinprick, vibration, temperature sense in the upper and lower extremities.   Gait, station and balance are unremarkable. No veering to one side is noted. No leaning to one side is noted. Posture is age-appropriate and stance is narrow based. No problems turning are noted. She turns en bloc. Tandem walk is difficult for her. Intact toe and heel stance is noted.               Assessment and Plan:   In summary, VIVION ROMANO is a very pleasant 72 y.o.-year old female with a history of intermittent paresthesias and intermittent facial drawing sensation. Her physical exam is nonfocal at this time and I reassured her and her husband in that regard. Interestingly, looking at her history, in 2011 she must have presented with similar symptoms. Her workup at that time included a cervical spine MRI and a brain MRI which I reviewed. 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 She may have to see a spine doctor. She does not have any evidence of neuropathy upon my exam and symptoms are intermittent and the facial drawing sensation is not intrinsically connected to when her paresthesias occur. I am not sure how to tie all this together but would like to proceed with a repeat brain MRI at this time as well as a repeat neck MRI because she does have degenerative changes which may have progressed over time. Also because of the intermittent or paroxysmal nature of her symptoms I would like to go ahead and do an EEG. I will hold off on EMG and nerve conduction velocity testing at this time. I explained these tests to her in detail and she is in agreement. I have reviewed blood work she had recently through your office and have asked her to discuss hyperlipidemia management with you. She is taking a baby aspirin and is encouraged to continue with that. I did not recommend any new medications at this time.  I answered all their questions today and the patient and her husband were in agreement with the above outlined plan. I would like to see the patient back in 3 months, sooner if the need arises and  encouraged them to call with any interim questions, concerns, problems or updates and test results.   Thank you very much for allowing me to participate in the care of this nice patient. If I can be of any further assistance to you please do not hesitate to call me at [phone].  Sincerely,   Anne Foley, MD, PhD

## 2013-01-23 NOTE — Patient Instructions (Addendum)
I think overall you are doing fairly well but I do want to suggest a few things today:  Remember to drink plenty of fluid, eat healthy meals and do not skip any meals. Try to eat protein with a every meal and eat a healthy snack such as fruit or nuts in between meals. Try to keep a regular sleep-wake schedule and try to exercise daily, particularly in the form of walking, 20-30 minutes a day, if you can.   As far as your medications are concerned, I would like to suggest no new medication.   As far as diagnostic testing: MRI brain and neck. You may need to see a spine doctor. We will do an EEG, which is electrical brain wave testing.   I would like to see you back in 3 months, sooner if we need to. Please call us with any interim questions, concerns, problems, updates or refill requests.  Please also call us for any test results so we can go over those with you on the phone. Brett Canales is my clinical assistant and will answer any of your questions and relay your messages to me and also relay most of my messages to you.  Our phone number is [phone]. We also have an after hours call service for urgent matters and there is a physician on-call for urgent questions. For any emergencies you know to call 911 or go to the nearest emergency room.

## 2013-01-24 ENCOUNTER — Other Ambulatory Visit: Payer: Self-pay | Admitting: Family Medicine

## 2013-01-30 ENCOUNTER — Other Ambulatory Visit: Payer: Medicare Other | Admitting: Radiology

## 2013-03-03 ENCOUNTER — Encounter: Payer: Self-pay | Admitting: Internal Medicine

## 2013-03-03 ENCOUNTER — Ambulatory Visit (INDEPENDENT_AMBULATORY_CARE_PROVIDER_SITE_OTHER): Payer: Medicare Other | Admitting: Internal Medicine

## 2013-03-03 ENCOUNTER — Ambulatory Visit: Payer: Medicare Other | Admitting: Internal Medicine

## 2013-03-03 VITALS — BP 180/102 | HR 69 | Ht 65.5 in | Wt 221.2 lb

## 2013-03-03 DIAGNOSIS — R079 Chest pain, unspecified: Secondary | ICD-10-CM

## 2013-03-03 DIAGNOSIS — I10 Essential (primary) hypertension: Secondary | ICD-10-CM

## 2013-03-03 DIAGNOSIS — E785 Hyperlipidemia, unspecified: Secondary | ICD-10-CM | POA: Diagnosis not present

## 2013-03-03 DIAGNOSIS — F458 Other somatoform disorders: Secondary | ICD-10-CM

## 2013-03-03 DIAGNOSIS — R0609 Other forms of dyspnea: Secondary | ICD-10-CM | POA: Insufficient documentation

## 2013-03-03 DIAGNOSIS — R0789 Other chest pain: Secondary | ICD-10-CM | POA: Diagnosis not present

## 2013-03-03 DIAGNOSIS — R0989 Other specified symptoms and signs involving the circulatory and respiratory systems: Secondary | ICD-10-CM

## 2013-03-03 NOTE — Progress Notes (Signed)
OFFICE NOTE  Chief Complaint:  Chest pressure, DOE, throat tightness  Primary Care Physician: Robyn Haber, MD  HPI:  Anne [name] is a pleasant 73 year old female who is currently referred to me for evaluation of chest pressure. She says she describing a bandlike sensation that wraps around her upper abdominal area and lower chest. She feels like her chest is "pulling in" from the inside. She reports the symptoms have been getting worse over the past several weeks and are associated with a little shortness of breath. Sometimes they're related to exertion and relieved by rest, and other times it can occur randomly. It does not seem that the symptoms are related to eating. Chest is history of thyroid cancer and had a left thyroid lobe removed.  She is also describing a globus type sensation with a fullness in her throat and soreness when she swallows. She recently saw a neurologist for paresthesias and other somatic complaints.  Workup to this point has been fairly unrevealing. Chest is a history of hypertension and dyslipidemia, but no family history of heart disease.  PMHx:  Past Medical History  Diagnosis Date  . Comedone   . Arthritis   . Cataract   . Hypertension   . Thyroid disease     thyroidectomy in 2011    Past Surgical History  Procedure Laterality Date  . Eye surgery    . Tonsillectomy and adenoidectomy    . Cataract extraction    . Thyroidectomy  12/25/2009  . 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 Total hip arthroplasty Right 06/03/2008    FAMHx:  Family History  Problem Relation Age of Onset  . Stroke Mother   . Cancer Father   . Cancer Sister   . Miscarriages / Stillbirths Neg Hx   . Cancer Sister   . Cancer Sister     SOCHx:   reports that she has never smoked. She has never used smokeless tobacco. She reports that she does not drink alcohol or use illicit drugs.  ALLERGIES:  No Known Allergies  ROS: A comprehensive review of systems was  negative except for: Respiratory: positive for dyspnea on exertion Cardiovascular: positive for exertional chest pressure/discomfort  HOME MEDS: Current Outpatient Prescriptions  Medication Sig Dispense Refill  . Acetaminophen (TYLENOL PO) Take by mouth as needed.      Marland Kitchen aspirin 81 MG tablet Take 81 mg by mouth daily.      . calcium carbonate (OS-CAL) 600 MG TABS Take 600 mg by mouth 2 (two) times daily with a meal.      . Cholecalciferol (VITAMIN D-3) 5000 UNITS TABS Take by mouth daily.      . fish oil-omega-3 fatty acids 1000 MG capsule Take 2 g by mouth daily.      Marland Kitchen levothyroxine (SYNTHROID, LEVOTHROID) 100 MCG tablet Take 1 tablet (100 mcg total) by mouth daily.  90 tablet  3  . losartan-hydrochlorothiazide (HYZAAR) 100-12.5 MG per tablet Take 1 tablet by mouth daily.  90 tablet  3  . Multiple Vitamin (MULTI VITAMIN DAILY PO) Take by mouth daily.       No current facility-administered medications for this visit.    LABS/IMAGING: No results found for this or any previous visit (from the past 48 hour(s)). No results found.  VITALS: BP 180/102  Pulse 69  Ht 5' 5.5" (1.664 m)  Wt 221 lb 3.2 oz (100.336 kg)  BMI 36.24 kg/m2  EXAM: General appearance: alert, no distress and moderately obese Neck: no  adenopathy, no carotid bruit, no JVD and thyroid not enlarged, symmetric, no tenderness/mass/nodules Lungs: clear to auscultation bilaterally Heart: regular rate and rhythm Abdomen: soft, non-tender; bowel sounds normal; no masses,  no organomegaly and obese, no POP over the mid-epigastric area Extremities: extremities normal, atraumatic, no cyanosis or edema Pulses: 2+ and symmetric Skin: Skin color, texture, turgor normal. No rashes or lesions Neurologic: Grossly normal Psych: Mildly anxious  EKG: Normal sinus rhythm at 69, anterior lateral T-wave inversions, possibly ischemia  ASSESSMENT: 1. Chest pain, shortness of breath-possibly concerning for unstable  angina 2. Progressive fatigue 3. Abnormal/ischemic EKG 4. Moderate obesity 5. Hypertension 6. Dyslipidemia 7. Globus sensation  PLAN: 1.   Mrs. Narayanan is describing some progressive fatigue, shortness of breath and bandlike chest discomfort or lower mid substernal area. Her EKG does show lateral T-wave inversions concerning for possible ischemia.  She's had 2 prior hip replacements and has difficulty with ambulation and was walking with a cane today. I would recommend a lexiscan nuclear stress test as she is unable to walk on a treadmill, however her symptoms are concerning for cardiac ischemia.  We'll plan to see her back in a few weeks to discuss results of her study.  I've also encouraged continued work on weight loss and dietary changes.  Thanks for the referral.  Pixie Casino, MD, [facility] Attending Cardiologist [facility]  HILTY,Kenneth C 03/03/2013, 12:58 PM

## 2013-03-03 NOTE — Patient Instructions (Addendum)
Your physician has requested that you have a lexiscan myoview. For further information please visit [email or web address]. Please follow instruction sheet, as given.  Your physician recommends that you schedule a follow-up appointment in: a few weeks, after your stress test.

## 2013-03-06 ENCOUNTER — Ambulatory Visit (HOSPITAL_COMMUNITY)
Admission: RE | Admit: 2013-03-06 | Discharge: 2013-03-06 | Disposition: A | Discharge status: Home / Self Care | Payer: Medicare Other | Source: Ambulatory Visit | Attending: Internal Medicine | Admitting: Internal Medicine

## 2013-03-06 DIAGNOSIS — R079 Chest pain, unspecified: Secondary | ICD-10-CM | POA: Diagnosis not present

## 2013-03-06 MED ORDER — TECHNETIUM TC 99M SESTAMIBI GENERIC - CARDIOLITE
30.0000 | Freq: Once | INTRAVENOUS | Status: AC | PRN
  Administered 2013-03-06: 30 via INTRAVENOUS

## 2013-03-06 MED ORDER — REGADENOSON 0.4 MG/5ML IV SOLN
0.4000 mg | Freq: Once | INTRAVENOUS | Status: AC
  Administered 2013-03-06: 0.4 mg via INTRAVENOUS

## 2013-03-06 MED ORDER — AMINOPHYLLINE 25 MG/ML IV SOLN
75.0000 mg | Freq: Once | INTRAVENOUS | Status: AC
  Administered 2013-03-06: 75 mg via INTRAVENOUS

## 2013-03-06 MED ORDER — TECHNETIUM TC 99M SESTAMIBI GENERIC - CARDIOLITE
10.0000 | Freq: Once | INTRAVENOUS | Status: AC | PRN
  Administered 2013-03-06: 10 via INTRAVENOUS

## 2013-03-06 NOTE — Procedures (Addendum)
[facility] [address] [place] [address] [phone]  Cardiology Nuclear Med Study  Anne [name] is a 73 y.o. female     MRN : [id number]     DOB: 1940-02-24  Procedure Date: 03/06/2013  Nuclear Med Background Indication for Stress Test:  Evaluation for Ischemia and Abnormal EKG History:  MVP Cardiac Risk Factors: Hypertension, Lipids and Obesity  Symptoms:  Chest Pain, DOE, Fatigue and Light-Headedness   Nuclear Pre-Procedure Caffeine/Decaff Intake:  7:00pm NPO After: 5:00am   IV Site: R Hand  IV 0.9% NS with Angio Cath:  22g  Chest Size (in):  n/a IV Started by: Azucena Cecil, RN  Height: 5' 5.5" (1.664 m)  Cup Size: C  BMI:  Body mass index is 36.2 kg/(m^2). Weight:  221 lb (100.245 kg)   Tech Comments:  n/a    Nuclear Med Study 1 or 2 day study: 1 day  Stress Test Type:  [place] Provider:  Lyman Bishop, MD   Resting Radionuclide: Technetium 60m Sestamibi  Resting Radionuclide Dose: 10.5 mCi   Stress Radionuclide:  Technetium 81m Sestamibi  Stress Radionuclide Dose: 31.2 mCi           Stress Protocol Rest HR: 74 Stress HR: 82  Rest BP: 149/96 Stress BP: 159/80  Exercise Time (min): n/a METS: n/a   Predicted Max HR: 148 bpm % Max HR: 60.81 bpm Rate Pressure Product: 14580  Dose of Adenosine (mg):  n/a Dose of Lexiscan: 0.4 mg  Dose of Atropine (mg): n/a Dose of Dobutamine: n/a mcg/kg/min (at max HR)  Stress Test Technologist: Leane Para, CCT Nuclear Technologist: Otho Perl, CNMT   Rest Procedure:  Myocardial perfusion imaging was performed at rest 45 minutes following the intravenous administration of Technetium 68m Sestamibi. Stress Procedure:  The patient received IV Lexiscan 0.4 mg over 15-seconds.  Technetium 15m Sestamibi injected at 30-seconds.  The patient experienced SOB and a headache; 75 mg of IV Aminophylline was administered with resolution of symptoms.   There were no significant changes with Lexiscan.  Quantitative spect images were obtained after a 45 minute delay.  Transient Ischemic Dilatation (Normal <1.22):  1.21 Lung/Heart Ratio (Normal <0.45):  0.30 QGS EDV:  51 ml QGS ESV:  13 ml LV Ejection Fraction: 74%  Rest ECG: NSR - Normal EKG  Stress ECG: Non-diagnostic due to lexiscan  QPS Raw Data Images:  Normal; no motion artifact; normal heart/lung ratio. Stress Images:  Normal homogeneous uptake in all areas of the myocardium. Rest Images:  Normal homogeneous uptake in all areas of the myocardium. Subtraction (SDS):  No evidence of ischemia.  Impression Exercise Capacity:  Lexiscan with no exercise. BP Response:  Normal blood pressure response. Clinical Symptoms:  There is dyspnea. ECG Impression:  ST depression inferiorly with lexiscan Comparison with Prior Nuclear Study: No previous nuclear study performed  Overall Impression:  Normal stress nuclear study.  LV Wall Motion:  NL LV Function; NL Wall Motion; EF 74%  Pixie Casino, MD, [facility] Board Certified in Nuclear Cardiology Attending Cardiologist [place], MD  03/06/2013 10:23 AM

## 2013-03-18 ENCOUNTER — Ambulatory Visit (INDEPENDENT_AMBULATORY_CARE_PROVIDER_SITE_OTHER): Payer: Medicare Other | Admitting: Internal Medicine

## 2013-03-18 ENCOUNTER — Encounter: Payer: Self-pay | Admitting: Internal Medicine

## 2013-03-18 VITALS — BP 166/88 | HR 84 | Ht 65.5 in | Wt 222.0 lb

## 2013-03-18 DIAGNOSIS — F458 Other somatoform disorders: Secondary | ICD-10-CM | POA: Diagnosis not present

## 2013-03-18 DIAGNOSIS — R0789 Other chest pain: Secondary | ICD-10-CM | POA: Diagnosis not present

## 2013-03-18 DIAGNOSIS — I10 Essential (primary) hypertension: Secondary | ICD-10-CM | POA: Diagnosis not present

## 2013-03-18 DIAGNOSIS — R0609 Other forms of dyspnea: Secondary | ICD-10-CM | POA: Diagnosis not present

## 2013-03-18 DIAGNOSIS — R0989 Other specified symptoms and signs involving the circulatory and respiratory systems: Secondary | ICD-10-CM

## 2013-03-18 MED ORDER — VALSARTAN-HYDROCHLOROTHIAZIDE 160-12.5 MG PO TABS: 1.0000 | ORAL_TABLET | Freq: Every day | ORAL | Status: DC

## 2013-03-18 NOTE — Patient Instructions (Signed)
Your physician has recommended you make the following change in your medication: STOP losartan-hctz. START valsartan-hctz 160-12.5mg  once daily.   Your physician wants you to follow-up in: 1 year. You will receive a reminder letter in the mail two months in advance. If you don't receive a letter, please call our office to schedule the follow-up appointment.

## 2013-03-18 NOTE — Progress Notes (Signed)
OFFICE NOTE  Chief Complaint:  Chest pressure, DOE, throat tightness  Primary Care Physician: Robyn Haber, MD  HPI:  Anne [name] is a pleasant 73 year old female who is currently referred to me for evaluation of chest pressure. She says she describing a bandlike sensation that wraps around her upper abdominal area and lower chest. She feels like her chest is "pulling in" from the inside. She reports the symptoms have been getting worse over the past several weeks and are associated with a little shortness of breath. Sometimes they're related to exertion and relieved by rest, and other times it can occur randomly. It does not seem that the symptoms are related to eating. Chest is history of thyroid cancer and had a left thyroid lobe removed.  She is also describing a globus type sensation with a fullness in her throat and soreness when she swallows. She recently saw a neurologist for paresthesias and other somatic complaints.  Workup to this point has been fairly unrevealing. Chest is a history of hypertension and dyslipidemia, but no family history of heart disease.  [name] returns today and reports that her symptoms have resolved. She underwent a nuclear stress test which was negative for ischemia and had a preserved EF of 74%.  Her only other concern today is that she has noted her blood pressure is running a little bit higher and she is interested in possibly going back to Diovan HCT which she says worked better for her.  PMHx:  Past Medical History  Diagnosis Date  . Comedone   . Arthritis   . Cataract   . Hypertension   . Thyroid disease     thyroidectomy in 2011    Past Surgical History  Procedure Laterality Date  . Eye surgery    . Tonsillectomy and adenoidectomy    . Cataract extraction    . Thyroidectomy  12/25/2009  . 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 Total hip arthroplasty Right 06/03/2008    FAMHx:  Family History  Problem Relation Age of  Onset  . Stroke Mother   . Cancer Father   . Cancer Sister   . Miscarriages / Stillbirths Neg Hx   . Cancer Sister   . Cancer Sister     SOCHx:   reports that she has never smoked. She has never used smokeless tobacco. She reports that she does not drink alcohol or use illicit drugs.  ALLERGIES:  No Known Allergies  ROS: A comprehensive review of systems was negative except for: Respiratory: positive for dyspnea on exertion Cardiovascular: positive for exertional chest pressure/discomfort  HOME MEDS: Current Outpatient Prescriptions  Medication Sig Dispense Refill  . Acetaminophen (TYLENOL PO) Take by mouth as needed.      Marland Kitchen aspirin 81 MG tablet Take 81 mg by mouth daily.      . calcium carbonate (OS-CAL) 600 MG TABS Take 600 mg by mouth 2 (two) times daily with a meal.      . Cholecalciferol (VITAMIN D-3) 5000 UNITS TABS Take by mouth daily.      . fish oil-omega-3 fatty acids 1000 MG capsule Take 2 g by mouth daily.      Marland Kitchen levothyroxine (SYNTHROID, LEVOTHROID) 100 MCG tablet Take 1 tablet (100 mcg total) by mouth daily.  90 tablet  3  . Multiple Vitamin (MULTI VITAMIN DAILY PO) Take by mouth daily.      . valsartan-hydrochlorothiazide (DIOVAN-HCT) 160-12.5 MG per tablet Take 1 tablet by mouth daily.  30 tablet  11   No current facility-administered medications for this visit.    LABS/IMAGING: No results found for this or any previous visit (from the past 48 hour(s)). No results found.  VITALS: BP 166/88  Pulse 84  Ht 5' 5.5" (1.664 m)  Wt 222 lb (100.699 kg)  BMI 36.37 kg/m2  EXAM: deferred  EKG: deferred  ASSESSMENT: 1. Chest pain - resolved, low risk stress test 2. Progressive fatigue 3. Abnormal/ischemic EKG 4. Moderate obesity 5. Hypertension 6. Dyslipidemia 7. Globus sensation  PLAN: 1.   [name] is feeling better and no longer had the symptoms she was having prior to her stress test. I reassured her today that the stress test was negative. Her  blood pressure is still running somewhat higher and she reports that she did do better before on Diovan HCT. I would like to restart that today at 160/12.5 mg daily. I'll plan to see her annually for hypertension followup and risk factor modification.  Thanks again for the referral.  Pixie Casino, MD, [facility] Attending Cardiologist [facility]  HILTY,Kenneth C 03/18/2013, 12:53 PM

## 2013-04-07 ENCOUNTER — Emergency Department: Payer: Self-pay | Admitting: Emergency Medicine

## 2013-04-07 DIAGNOSIS — R0789 Other chest pain: Secondary | ICD-10-CM | POA: Diagnosis not present

## 2013-04-07 DIAGNOSIS — I10 Essential (primary) hypertension: Secondary | ICD-10-CM | POA: Diagnosis not present

## 2013-04-07 DIAGNOSIS — R9431 Abnormal electrocardiogram [ECG] [EKG]: Secondary | ICD-10-CM | POA: Diagnosis not present

## 2013-04-07 DIAGNOSIS — K219 Gastro-esophageal reflux disease without esophagitis: Secondary | ICD-10-CM | POA: Diagnosis not present

## 2013-04-07 DIAGNOSIS — Z79899 Other long term (current) drug therapy: Secondary | ICD-10-CM | POA: Diagnosis not present

## 2013-04-07 DIAGNOSIS — E039 Hypothyroidism, unspecified: Secondary | ICD-10-CM | POA: Diagnosis not present

## 2013-04-07 DIAGNOSIS — R079 Chest pain, unspecified: Secondary | ICD-10-CM | POA: Diagnosis not present

## 2013-04-07 DIAGNOSIS — Z96649 Presence of unspecified artificial hip joint: Secondary | ICD-10-CM | POA: Diagnosis not present

## 2013-04-07 LAB — BASIC METABOLIC PANEL
Anion Gap: 5 — ABNORMAL LOW (ref 7–16)
BUN: 15 mg/dL (ref 7–18)
CALCIUM: 9.1 mg/dL (ref 8.5–10.1)
Chloride: 105 mmol/L (ref 98–107)
Co2: 28 mmol/L (ref 21–32)
Creatinine: 0.89 mg/dL (ref 0.60–1.30)
EGFR (African American): >60
GLUCOSE: 106 mg/dL — AB (ref 65–99)
Osmolality: 277 (ref 275–301)
POTASSIUM: 3.5 mmol/L (ref 3.5–5.1)
SODIUM: 138 mmol/L (ref 136–145)

## 2013-04-07 LAB — TROPONIN I: Troponin-I: <0.02 ng/mL

## 2013-04-07 LAB — CBC
HCT: 39.2 % (ref 35.0–47.0)
HGB: 13.3 g/dL (ref 12.0–16.0)
MCH: 32 pg (ref 26.0–34.0)
MCHC: 33.8 g/dL (ref 32.0–36.0)
MCV: 95 fL (ref 80–100)
PLATELETS: 192 10*3/uL (ref 150–440)
RBC: 4.14 10*6/uL (ref 3.80–5.20)
RDW: 13.1 % (ref 11.5–14.5)
WBC: 5.3 10*3/uL (ref 3.6–11.0)

## 2013-04-08 DIAGNOSIS — R9431 Abnormal electrocardiogram [ECG] [EKG]: Secondary | ICD-10-CM | POA: Diagnosis not present

## 2013-04-08 DIAGNOSIS — I10 Essential (primary) hypertension: Secondary | ICD-10-CM | POA: Diagnosis not present

## 2013-04-08 DIAGNOSIS — K219 Gastro-esophageal reflux disease without esophagitis: Secondary | ICD-10-CM

## 2013-04-08 DIAGNOSIS — E039 Hypothyroidism, unspecified: Secondary | ICD-10-CM | POA: Diagnosis not present

## 2013-04-08 DIAGNOSIS — R079 Chest pain, unspecified: Secondary | ICD-10-CM | POA: Diagnosis not present

## 2013-04-08 DIAGNOSIS — I059 Rheumatic mitral valve disease, unspecified: Secondary | ICD-10-CM

## 2013-04-08 LAB — TROPONIN I
Troponin-I: <0.02 ng/mL
Troponin-I: <0.02 ng/mL

## 2013-04-08 LAB — CK-MB
CK-MB: 0.6 ng/mL (ref 0.5–3.6)
CK-MB: 0.9 ng/mL (ref 0.5–3.6)
CK-MB: 1.2 ng/mL (ref 0.5–3.6)

## 2013-04-08 LAB — TSH: Thyroid Stimulating Horm: 2.6 u[IU]/mL

## 2013-04-08 LAB — HEMOGLOBIN A1C: Hemoglobin A1C: 6.4 % — ABNORMAL HIGH (ref 4.2–6.3)

## 2013-04-10 ENCOUNTER — Telehealth: Payer: Self-pay

## 2013-04-10 NOTE — Telephone Encounter (Signed)
Patient was in the hospital earlier this week and needs to come in for a follow up. Patient sees Dr. Joseph Art. Gave patient his schedule for the walk in center however patient states that she prefers the appointment center.  [phone]

## 2013-06-03 DIAGNOSIS — H25019 Cortical age-related cataract, unspecified eye: Secondary | ICD-10-CM | POA: Diagnosis not present

## 2013-06-03 DIAGNOSIS — H2510 Age-related nuclear cataract, unspecified eye: Secondary | ICD-10-CM | POA: Diagnosis not present

## 2013-06-03 DIAGNOSIS — H43819 Vitreous degeneration, unspecified eye: Secondary | ICD-10-CM | POA: Diagnosis not present

## 2013-06-03 DIAGNOSIS — D2310 Other benign neoplasm of skin of unspecified eyelid, including canthus: Secondary | ICD-10-CM | POA: Diagnosis not present

## 2013-06-13 ENCOUNTER — Ambulatory Visit: Payer: Medicare Other | Admitting: Neurology

## 2013-06-30 ENCOUNTER — Ambulatory Visit (INDEPENDENT_AMBULATORY_CARE_PROVIDER_SITE_OTHER): Payer: Medicare Other | Admitting: Emergency Medicine

## 2013-06-30 VITALS — BP 126/74 | HR 79 | Temp 97.9°F | Resp 18 | Ht 65.5 in | Wt 218.0 lb

## 2013-06-30 DIAGNOSIS — J029 Acute pharyngitis, unspecified: Secondary | ICD-10-CM

## 2013-06-30 DIAGNOSIS — J018 Other acute sinusitis: Secondary | ICD-10-CM

## 2013-06-30 DIAGNOSIS — J209 Acute bronchitis, unspecified: Secondary | ICD-10-CM | POA: Diagnosis not present

## 2013-06-30 MED ORDER — AMOXICILLIN-POT CLAVULANATE 875-125 MG PO TABS: 1.0000 | ORAL_TABLET | Freq: Two times a day (BID) | ORAL | Status: DC

## 2013-06-30 MED ORDER — AMOXICILLIN-POT CLAVULANATE 875-125 MG PO TABS
1.0000 | ORAL_TABLET | Freq: Two times a day (BID) | ORAL | Status: DC
Start: 2013-06-30 — End: 2013-06-30

## 2013-06-30 NOTE — Patient Instructions (Signed)
Pharyngitis °Pharyngitis is redness, pain, and swelling (inflammation) of your pharynx.  °CAUSES  °Pharyngitis is usually caused by infection. Most of the time, these infections are from viruses (viral) and are part of a cold. However, sometimes pharyngitis is caused by bacteria (bacterial). Pharyngitis can also be caused by allergies. Viral pharyngitis may be spread from person to person by coughing, sneezing, and personal items or utensils (cups, forks, spoons, toothbrushes). Bacterial pharyngitis may be spread from person to person by more intimate contact, such as kissing.  °SIGNS AND SYMPTOMS  °Symptoms of pharyngitis include:   °· Sore throat.   °· Tiredness (fatigue).   °· Low-grade fever.   °· Headache. °· Joint pain and muscle aches. °· Skin rashes. °· Swollen lymph nodes. °· Plaque-like film on throat or tonsils (often seen with bacterial pharyngitis). °DIAGNOSIS  °Your health care provider will ask you questions about your illness and your symptoms. Your medical history, along with a physical exam, is often all that is needed to diagnose pharyngitis. Sometimes, a rapid strep test is done. Other lab tests may also be done, depending on the suspected cause.  °TREATMENT  °Viral pharyngitis will usually get better in 3 4 days without the use of medicine. Bacterial pharyngitis is treated with medicines that kill germs (antibiotics).  °HOME CARE INSTRUCTIONS  °· Drink enough water and fluids to keep your urine clear or pale yellow.   °· Only take over-the-counter or prescription medicines as directed by your health care provider:   °· If you are prescribed antibiotics, make sure you finish them even if you start to feel better.   °· Do not take aspirin.   °· Get lots of rest.   °· Gargle with 8 oz of salt water (½ tsp of salt per 1 qt of water) as often as every 1 2 hours to soothe your throat.   °· Throat lozenges (if you are not at risk for choking) or sprays may be used to soothe your throat. °SEEK MEDICAL  CARE IF:  °· You have large, tender lumps in your neck. °· You have a rash. °· You cough up green, yellow-brown, or bloody spit. °SEEK IMMEDIATE MEDICAL CARE IF:  °· Your neck becomes stiff. °· You drool or are unable to swallow liquids. °· You vomit or are unable to keep medicines or liquids down. °· You have severe pain that does not go away with the use of recommended medicines. °· You have trouble breathing (not caused by a stuffy nose). °MAKE SURE YOU:  °· Understand these instructions. °· Will watch your condition. °· Will get help right away if you are not doing well or get worse. °Document Released: 01/30/2005 Document Revised: 11/20/2012 Document Reviewed: 10/07/2012 °ExitCare® Patient Information ©2014 [employer]. ° °

## 2013-06-30 NOTE — Progress Notes (Signed)
[facility] [address] [place] [address] [phone]  Date:  06/30/2013   Name:  Anne [name]   DOB:  08/07/1940   MRN:  [id number]  PCP:  Robyn Haber, MD    Chief Complaint: URI and Otalgia   History of Present Illness:  Anne [name] is a 73 y.o. very pleasant female patient who presents with the following:  Ill since Thursday with pressure in ears and intensely sore throat.  Has no nasal congestion.  Says she has post nasal drip and a cough productive of a purulent sputum.  No wheezing or shortness of breath.  No nausea or vomiting.  No stool change.  No rash.  Has some drainage in her left eye that glues her eye shut.  Has an intermittent fever up to 101.2 over the weekend.  No improvement with over the counter medications or other home remedies. Denies other complaint or health concern today.   Patient Active Problem List   Diagnosis Date Noted  . Chest pressure 03/03/2013  . Globus sensation 03/03/2013  . DOE (dyspnea on exertion) 03/03/2013  . Thyroid cancer 01/05/2012  . Hyperlipidemia 01/05/2012  . Dysphonia 01/05/2012  . 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 Hypertension 01/04/2012  . Hypothyroid 01/04/2012    Past Medical History  Diagnosis Date  . Comedone   . Arthritis   . Cataract   . Hypertension   . Thyroid disease     thyroidectomy in 2011    Past Surgical History  Procedure Laterality Date  . Eye surgery    . Tonsillectomy and adenoidectomy    . Cataract extraction    . Thyroidectomy  12/25/2009  . 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 Total hip arthroplasty Right 06/03/2008    History  Substance Use Topics  . Smoking status: Never Smoker   . Smokeless tobacco: Never Used  . Alcohol Use: No    Family History  Problem Relation Age of Onset  . Stroke Mother   . Cancer Father   . Cancer Sister   . Miscarriages / Stillbirths Neg Hx   . Cancer Sister   . Cancer Sister     No Known  Allergies  Medication list has been reviewed and updated.  Current Outpatient Prescriptions on File Prior to Visit  Medication Sig Dispense Refill  . Acetaminophen (TYLENOL PO) Take by mouth as needed.      Marland Kitchen aspirin 81 MG tablet Take 81 mg by mouth daily.      . calcium carbonate (OS-CAL) 600 MG TABS Take 600 mg by mouth 2 (two) times daily with a meal.      . Cholecalciferol (VITAMIN D-3) 5000 UNITS TABS Take by mouth daily.      . fish oil-omega-3 fatty acids 1000 MG capsule Take 2 g by mouth daily.      Marland Kitchen levothyroxine (SYNTHROID, LEVOTHROID) 100 MCG tablet Take 1 tablet (100 mcg total) by mouth daily.  90 tablet  3  . Multiple Vitamin (MULTI VITAMIN DAILY PO) Take by mouth daily.      . valsartan-hydrochlorothiazide (DIOVAN-HCT) 160-12.5 MG per tablet Take 1 tablet by mouth daily.  30 tablet  11   No current facility-administered medications on file prior to visit.    Review of Systems:  As per HPI, otherwise negative.    Physical Examination: Filed Vitals:   06/30/13 1609  BP: 126/74  Pulse: 79  Temp: 97.9 F (36.6 C)  Resp: 18  Filed Vitals:   06/30/13 1609  Height: 5' 5.5" (1.664 m)  Weight: 218 lb (98.884 kg)   Body mass index is 35.71 kg/(m^2). Ideal Body Weight: Weight in (lb) to have BMI = 25: 152.2  GEN: WDWN, NAD, Non-toxic, A & O x 3 HEENT: Atraumatic, Normocephalic. Neck supple. No masses, No LAD.  Intense pharyngeal edema Ears and Nose: No external deformity.  Green nasal drainage CV: RRR, No M/G/R. No JVD. No thrill. No extra heart sounds. PULM: CTA B, no wheezes, crackles, rhonchi. No retractions. No resp. distress. No accessory muscle use. ABD: S, NT, ND, +BS. No rebound. No HSM. EXTR: No c/c/e NEURO Normal gait.  PSYCH: Normally interactive. Conversant. Not depressed or anxious appearing.  Calm demeanor.    Assessment and Plan: Pharyngitis Sinusitis Bronchitis Augmentin  Signed,  Ellison Carwin, MD

## 2013-07-15 ENCOUNTER — Ambulatory Visit (INDEPENDENT_AMBULATORY_CARE_PROVIDER_SITE_OTHER): Payer: Medicare Other | Admitting: Emergency Medicine

## 2013-07-15 VITALS — BP 142/90 | HR 74 | Temp 97.9°F | Resp 18 | Ht 65.5 in | Wt 217.4 lb

## 2013-07-15 DIAGNOSIS — J029 Acute pharyngitis, unspecified: Secondary | ICD-10-CM

## 2013-07-15 MED ORDER — AZITHROMYCIN 250 MG PO TABS: ORAL_TABLET | ORAL | Status: DC

## 2013-07-15 NOTE — Progress Notes (Signed)
[facility] [address], [place] [address] [phone]  Date:  07/15/2013   Name:  Anne [name]   DOB:  [birth year]   MRN:  [id number]  PCP:  Robyn Haber, MD    Chief Complaint: Sore Throat and Otalgia   History of Present Illness:  Anne [name] is a 73 y.o. very pleasant female patient who presents with the following:  Seen 2 weeks ago with sinusitis and bronchitis.  Treated with augmentin.  Fever, cough, and conjunctivitis gone.  Now has persistent sore throat and pain in both ears.  Worse on left.   No further cough, no wheezing or shortness of breath.  No nausea or vomiting.  No stool change.  No improvement with over the counter medications or other home remedies. Denies other complaint or health concern today.   Patient Active Problem List   Diagnosis Date Noted  . Chest pressure 03/03/2013  . Globus sensation 03/03/2013  . DOE (dyspnea on exertion) 03/03/2013  . Thyroid cancer 01/05/2012  . Hyperlipidemia 01/05/2012  . Dysphonia 01/05/2012  . 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 Hypertension 01/04/2012  . Hypothyroid 01/04/2012    Past Medical History  Diagnosis Date  . Comedone   . Arthritis   . Cataract   . Hypertension   . Thyroid disease     thyroidectomy in 2011    Past Surgical History  Procedure Laterality Date  . Eye surgery    . Tonsillectomy and adenoidectomy    . Cataract extraction    . Thyroidectomy  12/25/2009  . 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 Total hip arthroplasty Right 06/03/2008    History  Substance Use Topics  . Smoking status: Never Smoker   . Smokeless tobacco: Never Used  . Alcohol Use: No    Family History  Problem Relation Age of Onset  . Stroke Mother   . Cancer Father   . Cancer Sister   . Miscarriages / Stillbirths Neg Hx   . Cancer Sister   . Cancer Sister     No Known Allergies  Medication list has been reviewed and updated.  Current Outpatient Prescriptions  on File Prior to Visit  Medication Sig Dispense Refill  . Acetaminophen (TYLENOL PO) Take by mouth as needed.      Marland Kitchen aspirin 81 MG tablet Take 81 mg by mouth daily.      . calcium carbonate (OS-CAL) 600 MG TABS Take 600 mg by mouth 2 (two) times daily with a meal.      . Cholecalciferol (VITAMIN D-3) 5000 UNITS TABS Take by mouth daily.      . fish oil-omega-3 fatty acids 1000 MG capsule Take 2 g by mouth daily.      Marland Kitchen levothyroxine (SYNTHROID, LEVOTHROID) 100 MCG tablet Take 1 tablet (100 mcg total) by mouth daily.  90 tablet  3  . Multiple Vitamin (MULTI VITAMIN DAILY PO) Take by mouth daily.      Marland Kitchen omeprazole (PRILOSEC) 20 MG capsule Take 20 mg by mouth daily.      . valsartan-hydrochlorothiazide (DIOVAN-HCT) 160-12.5 MG per tablet Take 1 tablet by mouth daily.  30 tablet  11  . amoxicillin-clavulanate (AUGMENTIN) 875-125 MG per tablet Take 1 tablet by mouth 2 (two) times daily.  20 tablet  0   No current facility-administered medications on file prior to visit.    Review of Systems:  As per HPI, otherwise negative.    Physical Examination: Filed Vitals:  07/15/13 1012  BP: 142/90  Pulse: 74  Temp: 97.9 F (36.6 C)  Resp: 18   Filed Vitals:   07/15/13 1012  Height: 5' 5.5" (1.664 m)  Weight: 217 lb 6.4 oz (98.612 kg)   Body mass index is 35.61 kg/(m^2). Ideal Body Weight: Weight in (lb) to have BMI = 25: 152.2  GEN: WDWN, NAD, Non-toxic, A & O x 3 HEENT: Atraumatic, Normocephalic. Neck supple. No masses, No LAD.  Erythematous oropharynx.  Tonsils enucleated Ears and Nose: No external deformity. CV: RRR, No M/G/R. No JVD. No thrill. No extra heart sounds. PULM: CTA B, no wheezes, crackles, rhonchi. No retractions. No resp. distress. No accessory muscle use. ABD: S, NT, ND, +BS. No rebound. No HSM. EXTR: No c/c/e NEURO Normal gait.  PSYCH: Normally interactive. Conversant. Not depressed or anxious appearing.  Calm demeanor.    Assessment and  Plan: Pharyngitis zpak  Signed,  Ellison Carwin, MD

## 2013-10-28 ENCOUNTER — Ambulatory Visit (INDEPENDENT_AMBULATORY_CARE_PROVIDER_SITE_OTHER): Payer: Medicare Other | Admitting: Family Medicine

## 2013-10-28 VITALS — BP 162/86 | HR 79 | Temp 98.5°F | Resp 18 | Ht 65.5 in | Wt 214.0 lb

## 2013-10-28 DIAGNOSIS — R059 Cough, unspecified: Secondary | ICD-10-CM

## 2013-10-28 DIAGNOSIS — R109 Unspecified abdominal pain: Secondary | ICD-10-CM | POA: Diagnosis not present

## 2013-10-28 DIAGNOSIS — R82998 Other abnormal findings in urine: Secondary | ICD-10-CM

## 2013-10-28 DIAGNOSIS — R112 Nausea with vomiting, unspecified: Secondary | ICD-10-CM

## 2013-10-28 DIAGNOSIS — E039 Hypothyroidism, unspecified: Secondary | ICD-10-CM

## 2013-10-28 DIAGNOSIS — R8281 Pyuria: Secondary | ICD-10-CM

## 2013-10-28 DIAGNOSIS — R05 Cough: Secondary | ICD-10-CM

## 2013-10-28 DIAGNOSIS — R42 Dizziness and giddiness: Secondary | ICD-10-CM

## 2013-10-28 LAB — POCT UA - MICROSCOPIC ONLY
BACTERIA, U MICROSCOPIC: NEGATIVE
CRYSTALS, UR, HPF, POC: NEGATIVE
Casts, Ur, LPF, POC: NEGATIVE
MUCUS UA: NEGATIVE
RBC, urine, microscopic: NEGATIVE
Yeast, UA: NEGATIVE

## 2013-10-28 LAB — POCT CBC
Granulocyte percent: 61.3 %G (ref 37–80)
HCT, POC: 44 % (ref 37.7–47.9)
Hemoglobin: 13.9 g/dL (ref 12.2–16.2)
Lymph, poc: 1.8 (ref 0.6–3.4)
MCH, POC: 30.7 pg (ref 27–31.2)
MCHC: 31.7 g/dL — AB (ref 31.8–35.4)
MCV: 96.9 fL (ref 80–97)
MID (cbc): 0.2 (ref 0–0.9)
MPV: 7.8 fL (ref 0–99.8)
POC Granulocyte: 3.1 (ref 2–6.9)
POC LYMPH PERCENT: 35.6 %L (ref 10–50)
POC MID %: 3.1 %M (ref 0–12)
Platelet Count, POC: 246 10*3/uL (ref 142–424)
RBC: 4.54 M/uL (ref 4.04–5.48)
RDW, POC: 14.7 %
WBC: 5 10*3/uL (ref 4.6–10.2)

## 2013-10-28 LAB — POCT URINALYSIS DIPSTICK
Bilirubin, UA: NEGATIVE
Blood, UA: NEGATIVE
GLUCOSE UA: NEGATIVE
Nitrite, UA: NEGATIVE
Protein, UA: NEGATIVE
SPEC GRAV UA: 1.02
Urobilinogen, UA: 1
pH, UA: 6

## 2013-10-28 MED ORDER — CEPHALEXIN 500 MG PO CAPS: 500.0000 mg | ORAL_CAPSULE | Freq: Four times a day (QID) | ORAL | Status: DC

## 2013-10-28 MED ORDER — ONDANSETRON 8 MG PO TBDP: 8.0000 mg | ORAL_TABLET | Freq: Three times a day (TID) | ORAL | Status: DC | PRN

## 2013-10-28 NOTE — Progress Notes (Signed)
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  The patient has not had her thyroid checked in quite some time I would like to recheck as well as making sure she is not dehydrated.  Patient has several intermittent episodes each day of urinary frequency but no dysuria or fever.  Patient had no diarrhea.  Patient has had a mild cough intermittently and last 24 hours. She's taking omeprazole as well as OTC Pepcid.  Objective: Patient alert and cooperative, seen with her husband. HEENT: Unremarkable. Neck: Supple with palpable thyroid which is slightly nodular and only mildly enlarged. No bruits She has no adenopathy. Neck is supple. Chest: Clear Heart: Regular no murmur Skin: Warm and dry.  Blood pressure is 160/80 pulse sitting and standing. Patient has no edema.  Assessment: Patient has some symptoms of reflux which accounts for some of the cough. She has small amount of pyuria.  Abdominal pain, unspecified abdominal location - Plan: POCT UA - Microscopic Only, POCT urinalysis dipstick  Nausea and vomiting, vomiting of unspecified type - Plan: POCT CBC, TSH, COMPLETE METABOLIC PANEL WITH GFR, ondansetron (ZOFRAN ODT) 8 MG disintegrating tablet  Dizziness - Plan: POCT CBC, TSH, COMPLETE METABOLIC PANEL WITH GFR  Hypothyroidism, unspecified hypothyroidism type - Plan: TSH  Pyuria - Plan: cephALEXin (KEFLEX) 500 MG capsule  Cough  Signed, Robyn Haber, MD  g

## 2013-10-29 LAB — COMPLETE METABOLIC PANEL WITH GFR
ALT: 19 U/L (ref 0–35)
AST: 22 U/L (ref 0–37)
Albumin: 4.4 g/dL (ref 3.5–5.2)
Alkaline Phosphatase: 69 U/L (ref 39–117)
BUN: 11 mg/dL (ref 6–23)
CO2: 23 mEq/L (ref 19–32)
Calcium: 9.4 mg/dL (ref 8.4–10.5)
Chloride: 101 mEq/L (ref 96–112)
Creat: 0.76 mg/dL (ref 0.50–1.10)
GFR, Est African American: >89 mL/min
GFR, Est Non African American: 78 mL/min
Glucose, Bld: 165 mg/dL — ABNORMAL HIGH (ref 70–99)
Potassium: 3.8 mEq/L (ref 3.5–5.3)
Sodium: 138 mEq/L (ref 135–145)
Total Bilirubin: 0.4 mg/dL (ref 0.2–1.2)
Total Protein: 7.7 g/dL (ref 6.0–8.3)

## 2013-10-29 LAB — TSH: TSH: 0.92 u[IU]/mL (ref 0.350–4.500)

## 2013-10-31 ENCOUNTER — Telehealth: Payer: Self-pay | Admitting: *Deleted

## 2013-10-31 NOTE — Telephone Encounter (Signed)
Received faxed for prior auth for ondansetron hcl- pt states she is feeling better and she paid cash for her scripts.

## 2014-01-01 ENCOUNTER — Other Ambulatory Visit: Payer: Self-pay

## 2014-01-01 DIAGNOSIS — Z1231 Encounter for screening mammogram for malignant neoplasm of breast: Secondary | ICD-10-CM

## 2014-01-15 ENCOUNTER — Ambulatory Visit
Admission: RE | Admit: 2014-01-15 | Discharge: 2014-01-15 | Disposition: A | Discharge status: Home / Self Care | Payer: Medicare Other | Source: Ambulatory Visit

## 2014-01-15 DIAGNOSIS — Z1231 Encounter for screening mammogram for malignant neoplasm of breast: Secondary | ICD-10-CM | POA: Diagnosis not present

## 2014-01-20 ENCOUNTER — Telehealth: Payer: Self-pay | Admitting: Family Medicine

## 2014-01-20 NOTE — Telephone Encounter (Signed)
Patient has an appointment on 12/10 and will receive her flu shot on this date.

## 2014-01-22 ENCOUNTER — Encounter: Payer: Self-pay | Admitting: Family Medicine

## 2014-01-22 ENCOUNTER — Ambulatory Visit (INDEPENDENT_AMBULATORY_CARE_PROVIDER_SITE_OTHER): Payer: Medicare Other | Admitting: Family Medicine

## 2014-01-22 VITALS — BP 158/77 | HR 79 | Temp 97.8°F | Resp 18 | Wt 215.0 lb

## 2014-01-22 DIAGNOSIS — E039 Hypothyroidism, unspecified: Secondary | ICD-10-CM | POA: Diagnosis not present

## 2014-01-22 DIAGNOSIS — I10 Essential (primary) hypertension: Secondary | ICD-10-CM

## 2014-01-22 DIAGNOSIS — Z1389 Encounter for screening for other disorder: Secondary | ICD-10-CM | POA: Diagnosis not present

## 2014-01-22 DIAGNOSIS — Z23 Encounter for immunization: Secondary | ICD-10-CM

## 2014-01-22 DIAGNOSIS — Z0000 Encounter for general adult medical examination without abnormal findings: Secondary | ICD-10-CM | POA: Diagnosis not present

## 2014-01-22 DIAGNOSIS — E2839 Other primary ovarian failure: Secondary | ICD-10-CM

## 2014-01-22 LAB — POCT URINALYSIS DIPSTICK
Bilirubin, UA: NEGATIVE
Blood, UA: NEGATIVE
Glucose, UA: NEGATIVE
Ketones, UA: NEGATIVE
Leukocytes, UA: NEGATIVE
Nitrite, UA: NEGATIVE
Protein, UA: NEGATIVE
Spec Grav, UA: <=1.005
Urobilinogen, UA: 0.2
pH, UA: 5.5

## 2014-01-22 MED ORDER — LEVOTHYROXINE SODIUM 100 MCG PO TABS
100.0000 ug | ORAL_TABLET | Freq: Every day | ORAL | Status: DC
Start: 2014-01-22 — End: 2015-02-18

## 2014-01-22 MED ORDER — VALSARTAN-HYDROCHLOROTHIAZIDE 160-12.5 MG PO TABS: 1.0000 | ORAL_TABLET | Freq: Every day | ORAL | Status: DC

## 2014-01-22 NOTE — Progress Notes (Signed)
Subjective:    Patient ID: Anne [name], female    DOB: [birth year], 73 y.o.   MRN: [id number]  This chart was scribed for Robyn Haber, MD by Chester Holstein, ED Scribe. This patient was seen in room Room/bed info not found and the patient's care was started at 11:15 AM.   HPI HPI Comments: SHERRYLL SKOCZYLAS is a 73 y.o. female who presents to the [facility] for her annual exam.  Pt notes she has occasionally been feeling a sharp pain radiating in the upper posterior portion of her LLE. She states she has taken Tylenol for relief. She notes she has a PMHx of bursitis and past surgical history of a double hip replacement. She notes she has trouble ambulating and she frequently uses her cane.  Pt has PMHx of GERD. Pt takes her Pepcid 15 minutes before she eats.  She notes she takes her omeprazole once a day before dinner which she usually eats around 5 PM. Pt notes she has changed her dietary habits, and has not had frequent issues with acid reflux since taking the medication.  Pt is utd with her pneumonia and flu vaccine. Pt thinks she had her last tetanus shot in 2008. She gets her mammogram annually. Pt denies vaginal bleeding, hematuria, rectal bleeding.   Patient does have an unstable gait at times and uses a cane.  Past Medical History  Diagnosis Date  . Comedone   . Arthritis   . Cataract   . Hypertension   . Thyroid disease     thyroidectomy in 2011    Past Surgical History  Procedure Laterality Date  . Eye surgery    . Tonsillectomy and adenoidectomy    . Cataract extraction    . Thyroidectomy  12/25/2009  . 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 Total hip arthroplasty Right 06/03/2008    Family History  Problem Relation Age of Onset  . Stroke Mother   . Cancer Father   . Cancer Sister   . Miscarriages / Stillbirths Neg Hx   . Cancer Sister   . Cancer Sister     History   Social History  . Marital Status: Married    Spouse Name: Juanda Crumble   Number of Children: 1  . Years of Education: 14   Occupational History  .     Social History Main Topics  . Smoking status: Never Smoker   . Smokeless tobacco: Never Used  . Alcohol Use: No  . Drug Use: No  . Sexual Activity: Not on file   Other Topics Concern  . Not on file   Social History Narrative    No Known Allergies   Review of Systems  Gastrointestinal: Negative for anal bleeding.  Genitourinary: Negative for hematuria and vaginal bleeding.  Musculoskeletal: Positive for myalgias and arthralgias.       Objective:   Physical Exam  Constitutional: She is oriented to person, place, and time. She appears well-developed and well-nourished.  HENT:  Head: Normocephalic.  Eyes: Conjunctivae are normal.  Neck: Normal range of motion. Neck supple.  Pulmonary/Chest: Effort normal.  Musculoskeletal: Normal range of motion.  Neurological: She is alert and oriented to person, place, and time.  Skin: Skin is warm and dry.  Psychiatric: She has a normal mood and affect. Her behavior is normal.  Nursing note and vitals reviewed. Examination chaperoned by Chester Holstein.   Normal breast exam Results for orders placed or performed in visit on 01/22/14  POCT urinalysis dipstick  Result Value Ref Range   Color, UA Yellow    Clarity, UA clear    Glucose, UA Negative    Bilirubin, UA Negative    Ketones, UA Negative    Spec Grav, UA <=1.005    Blood, UA negative    pH, UA 5.5    Protein, UA Negative    Urobilinogen, UA 0.2    Nitrite, UA Negative    Leukocytes, UA Negative        BP 158/77 mmHg  Pulse 79  Temp(Src) 97.8 F (36.6 C)  Resp 18  Wt 215 lb (97.523 kg)  SpO2 98%  Assessment & Plan:     This chart was scribed in my presence and reviewed by me personally.    ICD-9-CM ICD-10-CM   1. Annual physical exam V70.0 Z00.00   2. Need for prophylactic vaccination and inoculation against influenza V04.81 Z23 Flu Vaccine QUAD 36+ mos IM  3. Screening for  nephropathy V81.5 Z13.89 POCT urinalysis dipstick  4. Essential hypertension 401.9 I10 CBC with Differential     COMPLETE METABOLIC PANEL WITH GFR     Lipid panel  5. Estrogen deficiency 256.39 E28.39 DG Bone Density  6. Hypothyroidism, unspecified hypothyroidism type 244.9 E03.9 levothyroxine (SYNTHROID, LEVOTHROID) 100 MCG tablet     Signed, Robyn Haber, MD

## 2014-01-22 NOTE — Patient Instructions (Signed)
Pneumococcal Conjugate Vaccine: What You Need to Know  Your doctor recommends that you, or your child, get a dose of PCV13 today.  1. Why get vaccinated?  Pneumococcal conjugate vaccine (called PCV13 or Prevnar 13) is recommended to protect infants and toddlers, and some older children and adults with certain health conditions, from pneumococcal disease.  Pneumococcal disease is caused by infection with Streptococcus pneumoniae bacteria. These bacteria can spread from person to person through close contact.  Pneumococcal disease can lead to severe health problems, including pneumonia, blood infections, and meningitis.  Meningitis is an infection of the covering of the brain. Pneumococcal meningitis is fairly rare (less than 1 case per 100,000 people each year), but it leads to other health problems, including deafness and brain damage. In children, it is fatal in about 1 case out of 10.  Children younger than two are at higher risk for serious disease than older children.  People with certain medical conditions, people over age 65, and cigarette smokers are also at higher risk.  Before vaccine, pneumococcal infections caused many problems each year in the [place] in children younger than 5, including:  · more than 700 cases of meningitis,  · 13,000 blood infections,  · about 5 million ear infections, and  · about 200 deaths.  About 4,000 adults still die each year because of pneumococcal infections.  Pneumococcal infections can be hard to treat because some strains are resistant to antibiotics. This makes prevention through vaccination even more important.  2. PCV13 vaccine  There are more than 90 types of pneumococcal bacteria. PCV13 protects against 13 of them. These 13 strains cause most severe infections in children and about half of infections in adults.   PCV13 is routinely given to children at 2, 4, 6, and 12-15 months of age. Children in this age range are at greatest risk for serious diseases caused  by pneumococcal infection.  PCV13 vaccine may also be recommended for some older children or adults. Your doctor can give you details.  A second type of pneumococcal vaccine, called PPSV23, may also be given to some children and adults, including anyone over age 65. There is a separate Vaccine Information Statement for this vaccine.  3. Precautions   Anyone who has ever had a life-threatening allergic reaction to a dose of this vaccine, to an earlier pneumococcal vaccine called PCV7 (or Prevnar), or to any vaccine containing diphtheria toxoid (for example, DTaP), should not get PCV13.  Anyone with a severe allergy to any component of PCV13 should not get the vaccine. Tell your doctor if the person being vaccinated has any severe allergies.  If the person scheduled for vaccination is sick, your doctor might decide to reschedule the shot on another day.  Your doctor can give you more information about any of these precautions.  4. What are the risks of PCV13 vaccine?   With any medicine, including vaccines, there is a chance of side effects. These are usually mild and go away on their own, but serious reactions are also possible.  Reported problems associated with PCV13 vary by dose and age, but generally:  · About half of children became drowsy after the shot, had a temporary loss of appetite, or had redness or tenderness where the shot was given.  · About 1 out of 3 had swelling where the shot was given.  · About 1 out of 3 had a mild fever, and about 1 in 20 had a higher fever (over 102.2°F).  ·   Up to about 8 out of 10 became fussy or irritable.  Adults receiving the vaccine have reported redness, pain, and swelling where the shot was given. Mild fever, fatigue, headache, chills, or muscle pain have also been reported.  Life-threatening allergic reactions from any vaccine are very rare.  5. What if there is a serious reaction?  What should I look for?  · Look for anything that concerns you, such as signs of a  severe allergic reaction, very high fever, or behavior changes.  Signs of a severe allergic reaction can include hives, swelling of the face and throat, difficulty breathing, a fast heartbeat, dizziness, and weakness. These would start a few minutes to a few hours after the vaccination.  What should I do?  · If you think it is a severe allergic reaction or other emergency that can't wait, call 9-1-1 or get the person to the nearest hospital. Otherwise, call your doctor.  · Afterward, the reaction should be reported to the Vaccine Adverse Event Reporting System (VAERS). Your doctor might file this report, or you can do it yourself through the VAERS web site at www.vaers.hhs.gov, or by calling 1-800-822-7967.  VAERS is only for reporting reactions. They do not give medical advice.  6. The National Vaccine Injury Compensation Program  The National Vaccine Injury Compensation Program (VICP) is a federal program that was created to compensate people who may have been injured by certain vaccines.  Persons who believe they may have been injured by a vaccine can learn about the program and about filing a claim by calling 1-800-338-2382 or visiting the VICP website at www.hrsa.gov/vaccinecompensation.  7. How can I learn more?  · Ask your doctor.  · Call your local or state health department.  · Contact the Centers for Disease Control and Prevention (CDC):  ¨ Call 1-800-232-4636 (1-800-CDC-INFO) or  ¨ Visit CDC's website at www.cdc.gov/vaccines  CDC PCV13 Vaccine VIS (Interim) (04/12/11)  Document Released: 11/27/2005 Document Revised: 06/16/2013 Document Reviewed: 03/21/2013  ExitCare® Patient Information ©2015 [employer]. This information is not intended to replace advice given to you by your health care provider. Make sure you discuss any questions you have with your health care provider.

## 2014-01-23 ENCOUNTER — Other Ambulatory Visit: Payer: Self-pay | Admitting: Family Medicine

## 2014-01-23 DIAGNOSIS — E785 Hyperlipidemia, unspecified: Secondary | ICD-10-CM

## 2014-01-23 LAB — COMPLETE METABOLIC PANEL WITH GFR
ALT: 19 U/L (ref 0–35)
AST: 23 U/L (ref 0–37)
Albumin: 4.4 g/dL (ref 3.5–5.2)
Alkaline Phosphatase: 70 U/L (ref 39–117)
BUN: 12 mg/dL (ref 6–23)
CO2: 26 mEq/L (ref 19–32)
Calcium: 9.9 mg/dL (ref 8.4–10.5)
Chloride: 101 mEq/L (ref 96–112)
Creat: 0.71 mg/dL (ref 0.50–1.10)
GFR, Est African American: >89 mL/min
GFR, Est Non African American: 85 mL/min
Glucose, Bld: 103 mg/dL — ABNORMAL HIGH (ref 70–99)
Potassium: 3.8 mEq/L (ref 3.5–5.3)
Sodium: 139 mEq/L (ref 135–145)
Total Bilirubin: 0.5 mg/dL (ref 0.2–1.2)
Total Protein: 7.8 g/dL (ref 6.0–8.3)

## 2014-01-23 LAB — LIPID PANEL
Cholesterol: 255 mg/dL — ABNORMAL HIGH (ref 0–200)
HDL: 54 mg/dL (ref >39)
LDL Cholesterol: 173 mg/dL — ABNORMAL HIGH (ref 0–99)
Total CHOL/HDL Ratio: 4.7 Ratio
Triglycerides: 141 mg/dL (ref <150)
VLDL: 28 mg/dL (ref 0–40)

## 2014-01-23 LAB — CBC WITH DIFFERENTIAL/PLATELET
Basophils Absolute: 0 10*3/uL (ref 0.0–0.1)
Basophils Relative: 0 % (ref 0–1)
Eosinophils Absolute: 0.1 10*3/uL (ref 0.0–0.7)
Eosinophils Relative: 2 % (ref 0–5)
HCT: 38.4 % (ref 36.0–46.0)
Hemoglobin: 13.2 g/dL (ref 12.0–15.0)
Lymphocytes Relative: 42 % (ref 12–46)
Lymphs Abs: 1.7 10*3/uL (ref 0.7–4.0)
MCH: 31.4 pg (ref 26.0–34.0)
MCHC: 34.4 g/dL (ref 30.0–36.0)
MCV: 91.2 fL (ref 78.0–100.0)
MPV: 10.2 fL (ref 9.4–12.4)
Monocytes Absolute: 0.4 10*3/uL (ref 0.1–1.0)
Monocytes Relative: 9 % (ref 3–12)
Neutro Abs: 1.9 10*3/uL (ref 1.7–7.7)
Neutrophils Relative %: 47 % (ref 43–77)
Platelets: 251 10*3/uL (ref 150–400)
RBC: 4.21 MIL/uL (ref 3.87–5.11)
RDW: 14.5 % (ref 11.5–15.5)
WBC: 4.1 10*3/uL (ref 4.0–10.5)

## 2014-01-23 MED ORDER — ATORVASTATIN CALCIUM 20 MG PO TABS: 20.0000 mg | ORAL_TABLET | Freq: Every day | ORAL | Status: DC

## 2014-01-31 ENCOUNTER — Other Ambulatory Visit: Payer: Self-pay | Admitting: Family Medicine

## 2014-02-18 ENCOUNTER — Telehealth: Payer: Self-pay | Admitting: Family Medicine

## 2014-02-18 NOTE — Telephone Encounter (Signed)
Patient states that she received a call from [place] at [facility] wanting her to schedule an appointment. Patient has no idea what this is about. Can we help her?  [phone]

## 2014-02-19 NOTE — Telephone Encounter (Signed)
Anne [name] is calling to schedule her for a bone scan. She now remembers. Pt will call them to schedule this appt

## 2014-04-24 ENCOUNTER — Ambulatory Visit (INDEPENDENT_AMBULATORY_CARE_PROVIDER_SITE_OTHER): Payer: Medicare Other | Admitting: Family Medicine

## 2014-04-24 VITALS — BP 134/76 | HR 59 | Temp 97.5°F | Resp 16 | Ht 65.5 in | Wt 213.4 lb

## 2014-04-24 DIAGNOSIS — E785 Hyperlipidemia, unspecified: Secondary | ICD-10-CM | POA: Diagnosis not present

## 2014-04-24 DIAGNOSIS — R221 Localized swelling, mass and lump, neck: Secondary | ICD-10-CM | POA: Diagnosis not present

## 2014-04-24 DIAGNOSIS — R0982 Postnasal drip: Secondary | ICD-10-CM

## 2014-04-24 DIAGNOSIS — J329 Chronic sinusitis, unspecified: Secondary | ICD-10-CM | POA: Diagnosis not present

## 2014-04-24 DIAGNOSIS — Z96643 Presence of artificial hip joint, bilateral: Secondary | ICD-10-CM | POA: Diagnosis not present

## 2014-04-24 LAB — COMPREHENSIVE METABOLIC PANEL
ALT: 16 U/L (ref 0–35)
AST: 20 U/L (ref 0–37)
Albumin: 4.2 g/dL (ref 3.5–5.2)
Alkaline Phosphatase: 74 U/L (ref 39–117)
BUN: 11 mg/dL (ref 6–23)
CO2: 27 mEq/L (ref 19–32)
Calcium: 9.5 mg/dL (ref 8.4–10.5)
Chloride: 102 mEq/L (ref 96–112)
Creat: 0.61 mg/dL (ref 0.50–1.10)
Glucose, Bld: 99 mg/dL (ref 70–99)
Potassium: 4 mEq/L (ref 3.5–5.3)
Sodium: 141 mEq/L (ref 135–145)
Total Bilirubin: 0.6 mg/dL (ref 0.2–1.2)
Total Protein: 7.5 g/dL (ref 6.0–8.3)

## 2014-04-24 LAB — POCT CBC
Granulocyte percent: 49.5 %G (ref 37–80)
HCT, POC: 41.5 % (ref 37.7–47.9)
Hemoglobin: 13.3 g/dL (ref 12.2–16.2)
Lymph, poc: 1.9 (ref 0.6–3.4)
MCH, POC: 31.2 pg (ref 27–31.2)
MCHC: 32 g/dL (ref 31.8–35.4)
MCV: 97.6 fL — AB (ref 80–97)
MID (cbc): 0.3 (ref 0–0.9)
MPV: 7.5 fL (ref 0–99.8)
POC Granulocyte: 2.2 (ref 2–6.9)
POC LYMPH PERCENT: 43.6 %L (ref 10–50)
POC MID %: 6.9 %M (ref 0–12)
Platelet Count, POC: 219 10*3/uL (ref 142–424)
RBC: 4.26 M/uL (ref 4.04–5.48)
RDW, POC: 13.9 %
WBC: 4.4 10*3/uL — AB (ref 4.6–10.2)

## 2014-04-24 LAB — LIPID PANEL
Cholesterol: 157 mg/dL (ref 0–200)
HDL: 49 mg/dL (ref >=46)
LDL Cholesterol: 82 mg/dL (ref 0–99)
Total CHOL/HDL Ratio: 3.2 Ratio
Triglycerides: 128 mg/dL (ref <150)
VLDL: 26 mg/dL (ref 0–40)

## 2014-04-24 LAB — TSH: TSH: 1.247 u[IU]/mL (ref 0.350–4.500)

## 2014-04-24 NOTE — Progress Notes (Signed)
° °  Subjective:    Patient ID: Anne [name], female    DOB: 12/29/1940, 74 y.o.   MRN: [id number] This chart was scribed for Robyn Haber, MD, by Stephania Fragmin, ED Scribe. This patient was seen in room 10 and the patient's care was started at 12:00 PM.   Chief Complaint  Patient presents with   Follow-up    Cholesterol med   HPI HPI Comments: LOLLY [name] is a 74 y.o. female who presents to the [facility] for a follow-up for cholesterol medication. Patient denies myalgias or nausea with her cholesterol medication.  Patient also complains of right sided neck sensation that she describes as fullness, that began 3 weeks ago, resolved for a period of time, and returned. She also states that at night, she has some mild cough, which was alleviated with Robitussin. She reports coughing up copious amounts of phlegm after applying Vicks' ointment on her neck at one point. She reports she can also "bring phlegm up" by swallowing her throat a certain way.   Patient has a history total left hip arthroplasty. She denies weakness in her bilateral lower extremities, but states she does need to assist herself up with her hands from a standing position.   Patient's daughter lives in [place], [place].  Review of Systems  Respiratory: Positive for cough.   Gastrointestinal: Negative for nausea.  Musculoskeletal: Negative for myalgias.       Neck "fullness," per patient       Objective:   Physical Exam  Constitutional: She is oriented to person, place, and time. She appears well-developed and well-nourished. No distress.  HENT:  Head: Normocephalic and atraumatic.  Eyes: Conjunctivae and EOM are normal.  Neck: Neck supple. No tracheal deviation present. No thyromegaly present.  Cardiovascular: Normal rate.   Pulmonary/Chest: Effort normal. No respiratory distress.  Musculoskeletal: Normal range of motion.  Wide based gait. Slight favoring of right leg. Slight Trendelenburg  gait.  Neurological: She is alert and oriented to person, place, and time.  Skin: Skin is warm and dry.  Psychiatric: She has a normal mood and affect. Her behavior is normal.  Nursing note and vitals reviewed.  Blood Pressure Taken by Dr. Joseph Art with Manual Sphygmomanometer: 120/70     Assessment & Plan:   This chart was scribed in my presence and reviewed by me personally.    ICD-9-CM ICD-10-CM   1. Hyperlipidemia 272.4 E78.5 POCT CBC     Comprehensive metabolic panel     TSH     Lipid panel  2. Post-nasal drainage 473.9 J32.9 POCT CBC     Comprehensive metabolic panel     TSH     Lipid panel  3. Neck swelling 784.2 R22.1 POCT CBC     Comprehensive metabolic panel     TSH     Lipid panel     Signed, Robyn Haber, MD

## 2014-05-07 ENCOUNTER — Telehealth: Payer: Self-pay | Admitting: Family Medicine

## 2014-05-07 ENCOUNTER — Telehealth: Payer: Self-pay

## 2014-05-07 NOTE — Telephone Encounter (Signed)
Dr   L   Patient wants to ask you a question regarding her health.  You saw her last week.   [phone]

## 2014-05-07 NOTE — Telephone Encounter (Signed)
Anne [name] at 05/07/2014 2:03 PM     Status: Signed       Expand All Collapse All   Patient request for a referral for her thyroid. Patient request for Dr. Joseph Art to send her to the same specialist as last time. Patient request for a ultrasound to make sure everything is ok. Please call home number first [phone] then contact mobile number on file.        Please advise.

## 2014-05-07 NOTE — Telephone Encounter (Signed)
Patient request for a referral for her thyroid. Patient request for Dr. Joseph Art to send her to the same specialist as last time. Patient request for a ultrasound to make sure everything is ok. Please call home number first [phone] then contact mobile number on file.

## 2014-05-08 ENCOUNTER — Other Ambulatory Visit: Payer: Self-pay | Admitting: Family Medicine

## 2014-05-08 DIAGNOSIS — E039 Hypothyroidism, unspecified: Secondary | ICD-10-CM

## 2014-05-08 NOTE — Telephone Encounter (Signed)
Spoke to pt, she is aware referral has been placed.  She awaits the appointment time and date .

## 2014-05-10 ENCOUNTER — Telehealth: Payer: Self-pay

## 2014-05-10 NOTE — Telephone Encounter (Signed)
LMOM letting pt know her know that her bone density results were normal. Report sent to be scanned.

## 2014-05-25 ENCOUNTER — Encounter: Payer: Self-pay | Admitting: Family Medicine

## 2014-05-26 ENCOUNTER — Ambulatory Visit: Payer: Medicare Other | Admitting: Endocrinology

## 2014-06-04 ENCOUNTER — Ambulatory Visit (INDEPENDENT_AMBULATORY_CARE_PROVIDER_SITE_OTHER): Payer: Medicare Other | Admitting: Internal Medicine

## 2014-06-04 ENCOUNTER — Encounter: Payer: Self-pay | Admitting: Internal Medicine

## 2014-06-04 VITALS — BP 138/66 | HR 94 | Temp 97.4°F | Resp 14 | Ht 65.5 in | Wt 217.0 lb

## 2014-06-04 DIAGNOSIS — C73 Malignant neoplasm of thyroid gland: Secondary | ICD-10-CM | POA: Diagnosis not present

## 2014-06-04 DIAGNOSIS — E890 Postprocedural hypothyroidism: Secondary | ICD-10-CM

## 2014-06-04 NOTE — Progress Notes (Signed)
Patient ID: Anne [name], female   DOB: May 05, 1940, 74 y.o.   MRN: [id number]   HPI  Anne [name] is a 74 y.o.-year-old female, referred by her PCP, Dr. 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 She also has a h/o L Thyroid nodule, s/p L thyroidectomy.  H/o thyroid nodule: Pt had a L thyroid nodule >> FNA (11/09/2009):  THERE ARE ONCOCYTIC FOLLICULAR CELLS WITH MINIMAL LYMPHOID INFLAMMATION PRESENT. ALTHOUGH THE FINDINGS CAN BE SEEN WITH ONCOCYTIC CHANGES IN THE BACKGROUND OF HASHIMOTO'S THYROIDITIS, A HURTHLE CELL LESION AND/OR NEOPLASM IS NOT EXCLUDED.  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  Pt. has been dx with hypothyroidism in 2011; is on Levothyroxine 100 mcg, taken: - fasting - with water - separated by >45-60 min from b'fast  - + calcium after b'fast1 - no iron - + PPIs before lunch and dinner - + multivitamins before lunch  I reviewed pt's thyroid tests: Lab Results  Component Value Date   TSH 1.247 04/24/2014   TSH 0.920 10/28/2013   TSH 4.121 01/16/2013   TSH 2.738 02/29/2012   TSH 4.051 01/04/2012   TSH 1.200 09/20/2011    Pt describes: - no weight gain, but a little weight loss - + fatigue - + cold intolerance - + feeling hot - no depression - no constipation - no dry skin - no hair loss  Pt denies feeling nodules in neck, hoarseness, + occasional dysphagia with pills and food/no odynophagia, no SOB with lying down. She had a strain in the ant. part of R neck in 04/2014 >> better now. Has increased phlegm. She has a long standing GERD with an ED visit for CP 03/2012. She saw GI in the past.  She has no FH of thyroid disorders. No FH of thyroid cancer.  No h/o radiation tx to head or neck. No recent use of iodine  supplements.  ROS: Constitutional: + see HPI Eyes: no blurry vision, no xerophthalmia ENT: no sore throat, see HPI Cardiovascular: no CP/+ SOB/no palpitations/leg swelling Respiratory: no cough/+ SOB Gastrointestinal: no N/V/D/C/+ acid reflux Musculoskeletal: no muscle/joint aches Skin: + rash,  Neurological: no tremors/numbness/tingling/dizziness Psychiatric: no depression/anxiety  Past Medical History  Diagnosis Date  . Comedone   . Arthritis   . Cataract   . Hypertension   . Thyroid disease     thyroidectomy in 2011   Past Surgical History  Procedure Laterality Date  . Eye surgery    . Tonsillectomy and adenoidectomy    . Cataract extraction    . Thyroidectomy  12/25/2009  . 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 Total hip arthroplasty Right 06/03/2008   History   Social History  . Marital Status: Married    Spouse Name: Juanda Crumble  . Number of Children: 1  . Years of Education: 14   Occupational History  .     Social History Main Topics  . Smoking status: Never Smoker   . Smokeless tobacco: Never Used  . Alcohol Use: No  . Drug Use: No  . Sexual Activity: Not on file   Other Topics Concern  . Not on file   Social History Narrative   Current Outpatient Prescriptions on File Prior to Visit  Medication Sig Dispense Refill  . aspirin 81 MG tablet Take 81 mg by mouth daily.    Marland Kitchen atorvastatin (LIPITOR) 20  MG tablet Take 1 tablet (20 mg total) by mouth daily. 90 tablet 3  . calcium carbonate (OS-CAL) 600 MG TABS Take 600 mg by mouth 2 (two) times daily with a meal.    . Cholecalciferol (VITAMIN D-3) 5000 UNITS TABS Take by mouth daily.    . famotidine (PEPCID) 10 MG tablet Take 10 mg by mouth 2 (two) times daily.    Marland Kitchen levothyroxine (SYNTHROID, LEVOTHROID) 100 MCG tablet TAKE 1 TABLET BY MOUTH EVERY DAY 90 tablet 3  . Multiple Vitamin (MULTI VITAMIN DAILY PO) Take by mouth daily.    Marland Kitchen omeprazole (PRILOSEC) 20 MG capsule Take 20 mg by mouth 2 (two) times daily  before a meal.     . valsartan-hydrochlorothiazide (DIOVAN-HCT) 160-12.5 MG per tablet Take 1 tablet by mouth daily. 90 tablet 3  . Acetaminophen (TYLENOL PO) Take by mouth as needed.    . fish oil-omega-3 fatty acids 1000 MG capsule Take 2 g by mouth daily.    Marland Kitchen levothyroxine (SYNTHROID, LEVOTHROID) 100 MCG tablet Take 1 tablet (100 mcg total) by mouth daily. (Patient not taking: Reported on 06/04/2014) 90 tablet 3   No current facility-administered medications on file prior to visit.   No Known Allergies Family History  Problem Relation Age of Onset  . Stroke Mother   . Cancer Father   . Cancer Sister   . Miscarriages / Stillbirths Neg Hx   . Cancer Sister   . Cancer Sister    PE: BP 138/66 mmHg  Pulse 94  Temp(Src) 97.4 F (36.3 C) (Oral)  Resp 14  Ht 5' 5.5" (1.664 m)  Wt 217 lb (98.431 kg)  BMI 35.55 kg/m2  SpO2 96% Wt Readings from Last 3 Encounters:  06/04/14 217 lb (98.431 kg)  04/24/14 213 lb 6.4 oz (96.798 kg)  01/22/14 215 lb (97.523 kg)   Constitutional: overweight, in NAD Eyes: PERRLA, EOMI, no exophthalmos ENT: moist mucous membranes, no thyromegaly, no cervical lymphadenopathy Cardiovascular: tachycardia RR, No MRG Respiratory: CTA B Gastrointestinal: abdomen soft, NT, ND, BS+ Musculoskeletal: no deformities, strength intact in all 4 Skin: moist, warm, no rashes Neurological: no tremor with outstretched hands, DTR normal in all 4  ASSESSMENT: 1. Hypothyroidism - Likely Hashimoto's thyroiditis per U/S aspect  2. Pressure in R neck - Thyroid U/S (11/02/2009), before her L thyroidectomy:  Right thyroid lobe: 5.8 x 1.6 x 1.8 cm.  Left thyroid lobe: 5.1 x 2.7 x 2.2 cm.  Isthmus: 8 mm.  Focal nodules: The thyroid gland is somewhat inhomogeneous in echogenicity. A dominant solid nodule is noted in the lower pole on the left measuring 3.7 x 2.0 x 2.4 cm. Biopsy of this dominant nodule is recommended. A small hypoechoic nodule is noted in  thesuperior isthmus of 1.0 x 0.4 x 0.8 cm.  Lymphadenopathy: None visualized.  PLAN:  1. Patient with long-standing hypothyroidism, on levothyroxine therapy. She appears euthyroid. We reviewed her recent TST together with her and her husband >> normal. - We discussed about correct intake of levothyroxine, fasting, with water, separated by at least 30 minutes from breakfast, and separated by more than 4 hours from calcium, iron, multivitamins, acid reflux medications (PPIs). She is taking it correctly >> will move calcium later to lunchtime. - continue current regimen  2. Neck pressure - Pt with h/o L thyroidectomy for a suspicious nodule >> final dx was benign  - R lobe left in place - I do not feel that her neck compression sxs may be related to the  R lobe - this is not palpable, but they may be related to her chronic reflux. We discussed that she may need to see again GI to see if she needs a EGD. She will d/w PCP if he thinks this is feasible. If not, or if EGD normal >> will check a thyroid U/S at next visit.

## 2014-06-04 NOTE — Patient Instructions (Signed)
Take the thyroid hormone every day, with water, >30 minutes before breakfast, separated by >4 hours from acid reflux medications, calcium, iron, multivitamins.  Please continue Levothyroxine 100 mcg daily.  Please return in 6 months.

## 2014-06-05 DIAGNOSIS — H25011 Cortical age-related cataract, right eye: Secondary | ICD-10-CM | POA: Diagnosis not present

## 2014-06-05 DIAGNOSIS — Z961 Presence of intraocular lens: Secondary | ICD-10-CM | POA: Diagnosis not present

## 2014-06-05 DIAGNOSIS — H43813 Vitreous degeneration, bilateral: Secondary | ICD-10-CM | POA: Diagnosis not present

## 2014-06-05 DIAGNOSIS — H2511 Age-related nuclear cataract, right eye: Secondary | ICD-10-CM | POA: Diagnosis not present

## 2014-06-06 NOTE — Discharge Summary (Signed)
PATIENT NAME:  Anne [name], Anne [name] MR#:  [id number] DATE OF BIRTH:  1940-07-04  DATE OF ADMISSION:  04/07/2013 DATE OF DISCHARGE:  04/08/2013  ADMITTING DIAGNOSIS: Chest pain.   DISCHARGE DIAGNOSES: 1.  Atypical chest pain, likely gastroesophageal reflux disease.  2.  Hypertension. 3.  Hyperglycemia with hemoglobin A1c 6.4. 4.  Prediabetes.  5.  History of gastroesophageal reflux disease. 6.  Hypothyroidism. 7.  Obesity.   DISCHARGE CONDITION:  Stable.  DISCHARGE MEDICATIONS: The patient is to continue pantoprazole 20 mg p.o. twice daily, Diovan HCT 12.5/320 mg p.o. once daily.  HOME OXYGEN: None.   DIET: Two grams salt, low-fat, low-cholesterol, carbohydrate-controlled diet. The patient was advised to lose weight as she is about to develop diabetes. Diet consistency: Regular.   ACTIVITY LIMITATIONS: As tolerated.   FOLLOWUP APPOINTMENT: With primary care physician in 2 days after discharge. Dr. Debara Pickett, cardiology, in 2 days after discharge.  CONSULTANTS:  Cardiology, Dr. Rockey Situ; case management; social work.   RADIOLOGIC STUDIES: Echocardiogram, the 24th of February 2015, showed left ventricular ejection fraction by visual estimation 65% to 70%, normal global left ventricular systolic function, normal right ventricular size and systolic function, mild mitral valve regurgitation, mildly elevated pulmonary arterial systolic pressure, mild tricuspid regurgitation.  Chest x-ray, PA and lateral, the 23rd of February 2015, revealed cardiomegaly.   BRIEF HOSPITAL COURSE: This is a 74 year old African American female with past medical history significant for history of gastroesophageal reflux disease, history of hypertension, who presents to the hospital with complaints of chest pains. According to the patient, she was doing well up until approximately the evening before admission when she ate spicy meal for dinner with chili chicken after which approximately 20 minutes after that, she started  having squeezing-type chest pain. Please refer to Dr. Donivan Scull admission note on the 24th of February 2015.   On arrival to the Emergency Room, the patient's temperature was 98, pulse was 77. Respiration rate was 18, blood pressure 144/95, saturation was 95% on oxygen therapy.  Physical exam was unremarkable. The patient's lab data done on admission showed elevation of glucose to 106;  otherwise, BMP was unremarkable. The patient's hemoglobin A1c was found to be 6.4. Cardiac enzymes x3 were within normal limits. TSH was normal at 2.6. CBC was within normal limits.  EKG showed sinus rhythm at 59 to 99 beats per minute. Normal axis. No acute ST-T changes were noted; however, the patient did have some T-wave abnormalities, possible anterolateral ischemia. For this reason, she was admitted to the hospital for further evaluation and consultation with cardiologist was obtained.    Cardiologist saw the patient in consultation and felt that patient would benefit from echocardiogram. After echocardiogram was performed that was found to be completely within normal limits, it was felt that the risks of coronary artery disease are small; moreover, the patient's presentation was consistent with gastroesophageal reflux disease.   The patient was advised to eat  less spicy diet including less greasy meals, such as fried onions which gave her problems before.  She was advised to continue therapy for gastroesophageal reflux disease with Prilosec or any over-the-counter medications if she does not have any Protonix. She is also to continue blood pressure medications.   The patient was discharged in stable condition with above-mentioned medications and followup. On the day of discharge, the patient's temperature was 98, pulse was 64, respiration rate was 17, blood pressure 112/53, saturation was 96% on room air.   TIME SPENT: 40 minutes.   ____________________________ Theodoro Grist,  MD rv:np D: 04/09/2013 20:03:18  ET T: 04/09/2013 20:38:31 ET JOB#: [id number]  cc: Theodoro Grist, MD, <Dictator> Dr. Harrold Donath MD ELECTRONICALLY SIGNED 04/17/2013 18:26

## 2014-06-06 NOTE — Consult Note (Signed)
General Aspect Anne Anne [name] is Anne [name] 74yo African American female w/ PMHx s/f HTN, HLD, obesity, h/o thyroid CA s/p thyroidectomy and GERD who was observed at [facility] overnight for chest pain. Cardiology consulted for EKG changes in this setting.  She follows Dr. Debara Anne [name] in [place]. She had been referred to him for evaluation of chest pain. This was described as Anne [name] bandlike pressure wrapping around her upper abdomen and lower chest w/ associated shortness of breath. It had been worsening for several weeks. There were both non-exertional and exertional components. No relation to eating at the time, but she did endorse Anne [name] globus sensation in her throat when swallowing. She underwent Anne [name] Lexiscan Myoview last month revealed EF 74%, no evidence of ischemia. On last follow-up 03/20/13, her symptoms had resolved. She was reassured. Diovan was restarted for HTN.   She reports feeling well since then. She had Anne [name] meal of hot dogs, chili, onions, cole slaw and Coca-Cola yesterday. She then drove to Anne [name] store and when she pulled in the parking lot, she experiencing sharp intermittent abdominal pains. This eventually began radiating to her L-sided chest. She did have associated belching and nausea. Denies vomiting. This sensation was different in quality from the bandlike discomfort endorsed earlier this year. She has been worked up by GI in the past, and was diagnosed w/ GERD. No aggravation with exertion. Denies associated dyspnea or diaphoresis. No radiation to neck, jaw or L arm. Denies DOE/SOB, PND, orthopnea, LE edema. The discomfort worsened, and she presented to [facility] ED.   Present Illness . There, an initial EKG revealed NSR w/o ST/T changes. Initial TnI returned WNL. BMET, CBC were unremarkable. CXR- cardiomegaly, mild pulmonary vascular congestion w/o edema. She was markedly hypertensive at 216/100 on arrival. She received Anne [name] full-dose ASA. She was given Protonix, then Maalox solution. Fifteen minutes later, her chest pain  completely resolved. Her BP trended down correspondingly as pain improved. Two subsequent troponins returned WNL. TSH WNL. Hgb A1C 6.4%.  Anne [name] repeat EKG was obtained, it revealed sinus bradycardia, 59 bpm and anterior TWIs (V1-V6) and diffuse TW blunting.  She has felt well and asymptomatic today. No acute issues overnight.  PAST MEDICAL HISTORY: GERD, hypertension, hypothyroidism.   PAST SURGICAL HISTORY: Thyroid left lobectomy bilateral hip replacement.   ALLERGIES: No known drug allergies.   PSYCHOSOCIAL HISTORY: Lives at home with husband. Denies any history of smoking, alcohol or illicit drug usage.   FAMILY HISTORY: Hypertension runs in her family.   HOME MEDICATIONS: Pantoprazole 20 mg p.o. 2 times Anne [name] day, Diovan dose unknown takes orally.   Physical Exam:  GEN well developed, no acute distress, obese   HEENT pink conjunctivae, PERRL, hearing intact to voice   NECK supple  No masses  trachea midline  no JVD or bruits   RESP normal resp effort  clear BS  no use of accessory muscles   CARD Regular rate and rhythm  Normal, S1, S2  No murmur   ABD denies tenderness  soft  normal BS   EXTR negative cyanosis/clubbing, negative edema   SKIN normal to palpation, skin turgor good   NEURO follows commands, motor/sensory function intact   PSYCH alert, Anne [name]+O to time, place, person, good insight   Review of Systems:  Subjective/Chief Complaint abdominal and chest pain   General: No Complaints    Skin: No Complaints    ENT: No Complaints    Eyes: No Complaints    Neck: No Complaints    Respiratory: No Complaints  Cardiovascular: Chest pain or discomfort   Gastrointestinal: Heartburn  Nausea   Genitourinary: No Complaints    Vascular: No Complaints    Musculoskeletal: No Complaints    Neurologic: No Complaints    Hematologic: No Complaints    Endocrine: No Complaints    Psychiatric: No Complaints    Review of Systems: All other systems were reviewed and  found to be negative   Medications/Allergies Reviewed Medications/Allergies reviewed    Home Medications: Medication Instructions Status  pantoprazole 20 mg oral delayed release tablet 1 tab(s) orally 2 times Anne [name] day Active  Diovan HCT 12.5 mg-320 mg oral tablet 1 tab(s) orally once Anne [name] day Active   Lab Results:  Thyroid:  24-Feb-15 04:32   Thyroid Stimulating Hormone 2.60 (0.45-4.50 (International Unit)  ----------------------- Pregnant patients have  different reference  ranges for TSH:  - - - - - - - - - -  Pregnant, first trimetser:  0.36 - 2.50 uIU/mL)  Routine Chem:  23-Feb-15 19:02   Glucose, Serum  106  BUN 15  Creatinine (comp) 0.89  Sodium, Serum 138  Potassium, Serum 3.5  Chloride, Serum 105  CO2, Serum 28  Calcium (Total), Serum 9.1  Anion Gap  5  Osmolality (calc) 277  eGFR (African American) >60  eGFR (Non-African American) >60 (eGFR values <74m/min/1.73 m2 may be an indication of chronic kidney disease (CKD). Calculated eGFR is useful in patients with stable renal function. The eGFR calculation will not be reliable in acutely ill patients when serum creatinine is changing rapidly. It is not useful in  patients on dialysis. The eGFR calculation may not be applicable to patients at the low and high extremes of body sizes, pregnant women, and vegetarians.)  24-Feb-15 19:02   Hemoglobin A1c ([facility])  6.4 (The American Diabetes Association recommends that Anne [name] primary goal of therapy should be <7% and that physicians should reevaluate the treatment regimen in patients with HbA1c values consistently >8%.)  Cardiac:  23-Feb-15 19:02   CPK-MB, Serum 0.6 (Result(s) reported on 08 Apr 2013 at 08:40AM.)  Troponin I < 0.02 (0.00-0.05 0.05 ng/mL or less: NEGATIVE  Repeat testing in 3-6 hrs  if clinically indicated. >0.05 ng/mL: POTENTIAL  MYOCARDIAL INJURY. Repeat  testing in 3-6 hrs if  clinically indicated. NOTE: An increase or decrease  of 30% or more on  serial  testing suggests Anne [name]  clinically important change)  24-Feb-15 00:23   CPK-MB, Serum 0.9 (Result(s) reported on 08 Apr 2013 at 08:47AM.)  Troponin I < 0.02 (0.00-0.05 0.05 ng/mL or less: NEGATIVE  Repeat testing in 3-6 hrs  if clinically indicated. >0.05 ng/mL: POTENTIAL  MYOCARDIAL INJURY. Repeat  testing in 3-6 hrs if  clinically indicated. NOTE: An increase or decrease  of 30% or more on serial  testing suggests Anne [name]  clinically important change)    04:32   CPK-MB, Serum 1.2 (Result(s) reported on 08 Apr 2013 at 08:48AM.)  Troponin I < 0.02 (0.00-0.05 0.05 ng/mL or less: NEGATIVE  Repeat testing in 3-6 hrs  if clinically indicated. >0.05 ng/mL: POTENTIAL  MYOCARDIAL INJURY. Repeat  testing in 3-6 hrs if  clinically indicated. NOTE: An increase or decrease  of 30% or more on serial  testing suggests Anne [name]  clinically important change)  Routine Hem:  23-Feb-15 19:02   WBC (CBC) 5.3  RBC (CBC) 4.14  Hemoglobin (CBC) 13.3  Hematocrit (CBC) 39.2  Platelet Count (CBC) 192 (Result(s) reported on 07 Apr 2013 at 07:25PM.)  MCV 95  MCH 32.0  MCHC  33.8  RDW 13.1   EKG:  Interpretation EKG shows sinus bradycardia, TWI V1-V6, TW blunting diffusely, no ST changes   Rate 59   EKG Comparision Changed from  yesterday's tracing (no TWIs/blunting at that time)   Radiology Results: XRay:    23-Feb-15 19:22, Chest PA and Lateral  Chest PA and Lateral   REASON FOR EXAM:    chest pain  COMMENTS:       PROCEDURE: DXR - DXR CHEST PA (OR AP) AND LATERAL  - Apr 07 2013  7:22PM     CLINICAL DATA:  Chest pain this afternoon.  No shortness of breath.    EXAM:  CHEST  2 VIEW    COMPARISON:  None.    FINDINGS:  The heart is enlarged. There is mild pulmonary vascular congestion.  No overt edema identified. No focal consolidations or pleural  effusions. Degenerative changes are seen in the spine. Surgical  clips overlie the superior mediastinal region.      IMPRESSION:  Cardiomegaly.      Electronically Signed    By: Shon Hale M.D.    On: 04/07/2013 19:38         Verified By: Glenice Bow, M.D.,    No Known Allergies:    Impression 74yo African American female w/ PMHx s/f HTN, HLD, obesity, h/o thyroid CA s/p thyroidectomy and GERD who was observed at [facility] overnight for chest pain. Cardiology consulted for EKG changes in this setting.  1. Atypical chest pain, EKG changes  abdominal and chest discomfort are most characteristic of reflux symptoms. There was associated nausea and belching. This was completely relieved with Maalox and Protonix.   history of GERD.   ruled out with 3 normal troponins.   Lexiscan Myoview last month revealing no evidence of ischemia.   Unclear significance of EKG changes, possibly stress related euvolemic on exam  --ECHO showing normal wall motion, no significant valve disease -- Continue Protonix 7m BID or omeprazole 20 mkg po BID, PRN antacids or H2 blockers --Outpt ultrasound of gallbladdfer  2. GERD- as above. -- Continue PPI +/- PRN antacids, H2 blockers  3. Hypertension Marked BP elevation on arrival driven by discomfort.  -- Continue home antihypertensives  4. Obesity -- Weight loss as Anne [name] means of RF reduction   Electronic Signatures: Anne Anne [name] (PA-C)  (Signed 24-Feb-15 13:46)  Authored: General Aspect/Present Illness, History and Physical Exam, Review of System, Home Medications, Labs, EKG , Radiology, Allergies, Impression/Plan Anne Anne [name](MD)  (Signed 24-Feb-15 14:34)  Authored: General Aspect/Present Illness, History and Physical Exam, Review of System, Past Medical History, EKG , Impression/Plan  Co-Signer: General Aspect/Present Illness, History and Physical Exam, Review of System, Home Medications, Labs, EKG , Radiology, Allergies, Impression/Plan   Last Updated: 24-Feb-15 14:34 by Anne Anne [name](MD)

## 2014-06-06 NOTE — H&P (Signed)
PATIENT NAME:  COURTENAY, HIRTH MR#:  [id number] DATE OF BIRTH:  Oct 18, 1940  DATE OF ADMISSION:  04/07/2013  PRIMARY CARDIOLOGY Dr. Lyman Bishop.  CHIEF COMPLAINT: Chest pain.   HISTORY OF PRESENT ILLNESS: The patient is a 74 year old African American female with past medical history of hypertension, hypothyroidism and GERD is presenting to the ER with a chief complaint of chest pain. The patient is reporting that last night she had spicy meal for dinner with chili chicken, following that, she started developing squeezing type of chest pressure. She noticed a lot of pain in the epigastric area which was radiating to the chest. This was not associated with any shortness of breath, nausea, vomiting or diaphoresis. The patient comes into the ER. The initial set of troponin was negative. EKG did not reveal any changes, but sinus bradycardia at 59 beats per minute. ER physician thought of discharging the patient after repeating a second set of cardiac enzymes and the EKG. Second set of troponin is negative, but EKG showed T wave inversions from leads V2 to V6. ER physician has called on-call cardiologist, Dr. Neoma Laming who has recommended to admit the patient. Eventually ER physician came to know that the patient was seen by  Dr.Kenneth Hilty  in the past. At  2:40 a.m. they had discussed with [facility], who is on call, who has recommended to admit the patient for observation and to request [facility] cardiology  consult in a.m. During my examination, the patient's abdominal pain and the chest pain are completely resolved after given pain medicine. Husband is at bedside. The patient is reporting that she was just seen by cardiology, Dr. Lyman Bishop three weeks ago regarding a similar complaint of intermittent episodes of chest pain. The patient had total cardiology work-up including chemical stress test, which was normal as reported by the patient. Denies any other complaints.   PAST MEDICAL HISTORY: GERD,  hypertension, hypothyroidism.   PAST SURGICAL HISTORY: Thyroid left lobectomy bilateral hip replacement.   ALLERGIES: No known drug allergies.   PSYCHOSOCIAL HISTORY: Lives at home with husband. Denies any history of smoking, alcohol or illicit drug usage.   FAMILY HISTORY: Hypertension runs in her family.   HOME MEDICATIONS: Pantoprazole 20 mg p.o. 2 times a day, Diovan dose unknown takes orally.  REVIEW OF SYSTEMS: CONSTITUTIONAL: Denies any fever or fatigue.  EYES: Denies blurry vision, double vision.  ENT: Denies epistaxis, discharge.  RESPIRATION: Denies cough, chronic obstructive pulmonary disease.  CARDIOVASCULAR: Complaining of chest pain, midsternal area with no diaphoresis. No palpitations.  GASTROINTESTINAL: Denies nausea, vomiting, diarrhea, abdominal pain.  GYNECOLOGIC AND BREASTS: Denies breast mass or vaginal discharge.  ENDOCRINE: Denies polyuria, nocturia. Has hypothyroidism on Synthroid. HEMATOLOGIC AND LYMPHATIC: Denies anemia ,easy bruising, bleeding.  INTEGUMENTARY: No acne, rash, lesions.  MUSCULOSKELETAL: No joint pain in neck and back. Denies any gout. NEUROLOGICAL: No  vertigo, ataxia. PSYCHOLOGICAL: No ADD, OCD  PHYSICAL EXAMINATION: VITAL SIGNS: Temperature is 98, pulse 77, respirations 18, blood pressure 144/95, pulse oximetry is 95%.  GENERAL APPEARANCE: Not in acute distress. Moderately built and nourished.  HEENT: Normocephalic, atraumatic. Pupils are equally reacting to light and accommodation. No scleral icterus. No conjunctival injection. No sinus tenderness. No postnasal drip.  NECK: Supple. No JVD. No thyromegaly.  LUNGS: Clear to auscultation bilaterally. No accessory muscle use and no anterior chest wall tenderness on palpation.  CARDIAC: S1, S2 normal. Regular rate and rhythm. No murmurs.  GASTROINTESTINAL: Soft. Bowel sounds are positive in all four quadrants. Nontender, nondistended.  No hepatosplenomegaly. No masses felt.  NEUROLOGIC: Awake,  alert, oriented x 3. Motor and sensory grossly intact. Reflexes are 2+.  EXTREMITIES: No edema. No cyanosis. No clubbing.   SKIN: Warm to touch. Normal turgor. No rashes. No lesions.  MUSCULOSKELETAL: No joint effusion, tenderness, erythema.  PSYCHIATRIC: Normal mood and affect.   LABORATORIES AND IMAGING STUDIES: CBC is normal. BMP is normal except glucose at 106 and anion gap at 5. Troponin T are less than 0.02 x 2. A 12-lead EKG has revealed sinus bradycardia at 59 beats per minute with marked sinus rhythm, T wave abnormalities which are new from V2 through V6. Portable chest x-ray has revealed cardiomegaly.   ASSESSMENT AND PLAN: An 74 year old female presenting to the ER with a chief complaint of   abdominal pain as well as squeezing type of chest pain after she had spicy meal, will be admitted with following assessment and plan:  1. Atypical chest pain, rule out acute myocardial infarction. The first two sets of her cardiac enzymes are negative, but the patient has abnormal EKG with new T wave inversions from leads V2 through V6. We will admit the patient to telemetry, Acute coronary syndrome protocol with oxygen, nitroglycerin, aspirin, beta blocker and statin. Consult is placed to Dr. Fletcher Anon as the patient was seen by this cardiology group in the past.  2. Reports that recent stress test and   echo was normal.  3. Hypertension. We will  resume her home medication and the patient will be on a small dose of beta blocker.  4. Hyperthyroidism. We will provide her Synthroid.  5. Gastroesophageal reflux disease, Protonix.   Diagnosis and plan of care was discussed in detail with the patient. She is aware of the plan. She is FULL CODE. Her husband is the medical power of attorney. Plan of care discussed with the patient and her husband at bedside.   TOTAL TIME SPENT ON ADMISSION: Is 45 minutes.    ____________________________ Nicholes Mango, MD ag:sg D: 04/08/2013 04:51:31 ET T: 04/08/2013  06:10:39 ET JOB#: [id number]  cc: Nicholes Mango, MD, <Dictator> Nicholes Mango MD ELECTRONICALLY SIGNED 04/19/2013 5:30

## 2014-12-04 ENCOUNTER — Telehealth: Payer: Self-pay | Admitting: Family Medicine

## 2014-12-04 ENCOUNTER — Ambulatory Visit: Payer: Medicare Other | Admitting: Internal Medicine

## 2014-12-04 NOTE — Telephone Encounter (Signed)
Spoke with patient about scheduling a physical.  She is wanting a physician not a PA and there is not an appointment available until January 9.  She is going to call me back with her schedule.

## 2014-12-04 NOTE — Telephone Encounter (Signed)
PATIENT RETURNED [place] DR. Lorelei Pont FOR March 10 2015 FOR CPE.

## 2014-12-18 DIAGNOSIS — Z23 Encounter for immunization: Secondary | ICD-10-CM | POA: Diagnosis not present

## 2014-12-30 ENCOUNTER — Other Ambulatory Visit: Payer: Self-pay

## 2014-12-30 DIAGNOSIS — Z1231 Encounter for screening mammogram for malignant neoplasm of breast: Secondary | ICD-10-CM

## 2015-01-16 ENCOUNTER — Other Ambulatory Visit: Payer: Self-pay | Admitting: Family Medicine

## 2015-01-20 ENCOUNTER — Ambulatory Visit
Admission: RE | Admit: 2015-01-20 | Discharge: 2015-01-20 | Disposition: A | Discharge status: Home / Self Care | Payer: Medicare Other | Source: Ambulatory Visit

## 2015-01-20 DIAGNOSIS — Z1231 Encounter for screening mammogram for malignant neoplasm of breast: Secondary | ICD-10-CM

## 2015-01-28 ENCOUNTER — Encounter: Payer: Medicare Other | Admitting: Family Medicine

## 2015-02-03 ENCOUNTER — Other Ambulatory Visit: Payer: Self-pay | Admitting: Family Medicine

## 2015-02-07 ENCOUNTER — Other Ambulatory Visit: Payer: Self-pay | Admitting: Family Medicine

## 2015-02-15 ENCOUNTER — Emergency Department (INDEPENDENT_AMBULATORY_CARE_PROVIDER_SITE_OTHER)
Admission: EM | Admit: 2015-02-15 | Discharge: 2015-02-15 | Disposition: A | Discharge status: Other Acute Inpatient Hospital | Payer: Medicare Other | Source: Home / Self Care | Attending: Family Medicine | Admitting: Family Medicine

## 2015-02-15 ENCOUNTER — Encounter (HOSPITAL_COMMUNITY): Payer: Self-pay | Admitting: *Deleted

## 2015-02-15 ENCOUNTER — Emergency Department (HOSPITAL_COMMUNITY)
Admission: EM | Admit: 2015-02-15 | Discharge: 2015-02-15 | Disposition: A | Discharge status: Home / Self Care | Payer: Medicare Other | Attending: Emergency Medicine | Admitting: Emergency Medicine

## 2015-02-15 DIAGNOSIS — Z872 Personal history of diseases of the skin and subcutaneous tissue: Secondary | ICD-10-CM | POA: Insufficient documentation

## 2015-02-15 DIAGNOSIS — E079 Disorder of thyroid, unspecified: Secondary | ICD-10-CM | POA: Insufficient documentation

## 2015-02-15 DIAGNOSIS — Z79899 Other long term (current) drug therapy: Secondary | ICD-10-CM | POA: Diagnosis not present

## 2015-02-15 DIAGNOSIS — I10 Essential (primary) hypertension: Secondary | ICD-10-CM | POA: Diagnosis not present

## 2015-02-15 DIAGNOSIS — M1990 Unspecified osteoarthritis, unspecified site: Secondary | ICD-10-CM | POA: Diagnosis not present

## 2015-02-15 DIAGNOSIS — N390 Urinary tract infection, site not specified: Secondary | ICD-10-CM | POA: Insufficient documentation

## 2015-02-15 DIAGNOSIS — Z8669 Personal history of other diseases of the nervous system and sense organs: Secondary | ICD-10-CM | POA: Diagnosis not present

## 2015-02-15 DIAGNOSIS — Z7982 Long term (current) use of aspirin: Secondary | ICD-10-CM | POA: Diagnosis not present

## 2015-02-15 DIAGNOSIS — R1031 Right lower quadrant pain: Secondary | ICD-10-CM | POA: Diagnosis not present

## 2015-02-15 LAB — COMPREHENSIVE METABOLIC PANEL
ALK PHOS: 71 U/L (ref 38–126)
ALT: 22 U/L (ref 14–54)
AST: 25 U/L (ref 15–41)
Albumin: 4.5 g/dL (ref 3.5–5.0)
Anion gap: 15 (ref 5–15)
BILIRUBIN TOTAL: 0.8 mg/dL (ref 0.3–1.2)
BUN: 12 mg/dL (ref 6–20)
CALCIUM: 10.2 mg/dL (ref 8.9–10.3)
CO2: 26 mmol/L (ref 22–32)
CREATININE: 0.77 mg/dL (ref 0.44–1.00)
Chloride: 102 mmol/L (ref 101–111)
Glucose, Bld: 104 mg/dL — ABNORMAL HIGH (ref 65–99)
Potassium: 3.8 mmol/L (ref 3.5–5.1)
Sodium: 143 mmol/L (ref 135–145)
TOTAL PROTEIN: 8.2 g/dL — AB (ref 6.5–8.1)

## 2015-02-15 LAB — URINALYSIS, ROUTINE W REFLEX MICROSCOPIC
Bilirubin Urine: NEGATIVE
Glucose, UA: NEGATIVE mg/dL
HGB URINE DIPSTICK: NEGATIVE
Ketones, ur: NEGATIVE mg/dL
NITRITE: POSITIVE — AB
PROTEIN: NEGATIVE mg/dL
Specific Gravity, Urine: 1.013 (ref 1.005–1.030)
pH: 6 (ref 5.0–8.0)

## 2015-02-15 LAB — URINE MICROSCOPIC-ADD ON

## 2015-02-15 LAB — CBC
HCT: 40.3 % (ref 36.0–46.0)
Hemoglobin: 13.8 g/dL (ref 12.0–15.0)
MCH: 32.8 pg (ref 26.0–34.0)
MCHC: 34.2 g/dL (ref 30.0–36.0)
MCV: 95.7 fL (ref 78.0–100.0)
PLATELETS: 216 10*3/uL (ref 150–400)
RBC: 4.21 MIL/uL (ref 3.87–5.11)
RDW: 14 % (ref 11.5–15.5)
WBC: 4.8 10*3/uL (ref 4.0–10.5)

## 2015-02-15 LAB — POCT URINALYSIS DIP (DEVICE)
Bilirubin Urine: NEGATIVE
Glucose, UA: NEGATIVE mg/dL
Hgb urine dipstick: NEGATIVE
KETONES UR: NEGATIVE mg/dL
Nitrite: NEGATIVE
PH: 7 (ref 5.0–8.0)
PROTEIN: NEGATIVE mg/dL
SPECIFIC GRAVITY, URINE: 1.01 (ref 1.005–1.030)
UROBILINOGEN UA: 0.2 mg/dL (ref 0.0–1.0)

## 2015-02-15 LAB — LIPASE, BLOOD: Lipase: 24 U/L (ref 11–51)

## 2015-02-15 MED ORDER — CEPHALEXIN 500 MG PO CAPS: 500.0000 mg | ORAL_CAPSULE | Freq: Two times a day (BID) | ORAL | Status: DC

## 2015-02-15 NOTE — ED Provider Notes (Signed)
CSN: [id number]     Arrival date & time 02/15/15  1719 History   First MD Initiated Contact with Patient 02/15/15 2004     Chief Complaint  Patient presents with  . Abdominal Pain     (Consider location/radiation/quality/duration/timing/severity/associated sxs/prior Treatment) HPI Comments: 75 year old female with a history of arthritis and hypertension presents to the emergency department for evaluation of intermittent, nonradiating, aching pain in her right lower quadrant over the past 6 days. She states that her symptoms were associated with dysuria 4 days ago. She has not had any dysuria recently, however. She states that she has been taking Tylenol for symptoms which relieves her pain. She recently completed a course of clindamycin for dental pain. She has had no associated fever, chest pain, shortness of breath, nausea, vomiting, diarrhea, melena or hematochezia. No history of abdominal surgeries.  PCP - Dr. Lorelei Pont  Patient is a 75 y.o. female presenting with abdominal pain. The history is provided by the patient. No language interpreter was used.  Abdominal Pain Associated symptoms: dysuria   Associated symptoms: no chest pain, no diarrhea, no fever, no nausea, no shortness of breath and no vomiting     Past Medical History  Diagnosis Date  . Comedone   . Arthritis   . Cataract   . Hypertension   . Thyroid disease     thyroidectomy in 2011   Past Surgical History  Procedure Laterality Date  . Eye surgery    . Tonsillectomy and adenoidectomy    . Cataract extraction    . Thyroidectomy  12/25/2009  . 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 Total hip arthroplasty Right 06/03/2008   Family History  Problem Relation Age of Onset  . Stroke Mother   . Cancer Father   . Cancer Sister   . Miscarriages / Stillbirths Neg Hx   . Cancer Sister   . Cancer Sister    Social History  Substance Use Topics  . Smoking status: Never Smoker   . Smokeless tobacco: Never Used  .  Alcohol Use: No   OB History    No data available      Review of Systems  Constitutional: Negative for fever.  Respiratory: Negative for shortness of breath.   Cardiovascular: Negative for chest pain.  Gastrointestinal: Positive for abdominal pain (intermittent x 6 days). Negative for nausea, vomiting and diarrhea.  Genitourinary: Positive for dysuria.  All other systems reviewed and are negative.   Allergies  Review of patient's allergies indicates no known allergies.  Home Medications   Prior to Admission medications   Medication Sig Start Date End Date Taking? Authorizing Provider  acetaminophen (TYLENOL) 500 MG tablet Take 500 mg by mouth every 6 (six) hours as needed for mild pain.   Yes Historical Provider, MD  aspirin 81 MG tablet Take 81 mg by mouth daily.   Yes Historical Provider, MD  atorvastatin (LIPITOR) 20 MG tablet TAKE 1 TABLET (20 MG TOTAL) BY MOUTH DAILY. PATIENT NEEDS OFFICE VISIT FOR ADDITIONAL REFILLS 02/04/15  Yes Bennett Scrape V, PA-C  calcium carbonate (OS-CAL) 600 MG TABS Take 600 mg by mouth 2 (two) times daily with a meal.   Yes Historical Provider, MD  Cholecalciferol (VITAMIN D-3) 5000 UNITS TABS Take by mouth daily.   Yes Historical Provider, MD  famotidine (PEPCID) 10 MG tablet Take 10 mg by mouth 2 (two) times daily.   Yes Historical Provider, MD  fish oil-omega-3 fatty acids 1000 MG capsule Take 2 g by mouth daily.  Yes Historical Provider, MD  levothyroxine (SYNTHROID, LEVOTHROID) 100 MCG tablet Take 1 tablet (100 mcg total) by mouth daily. 01/22/14  Yes Robyn Haber, MD  Multiple Vitamin (MULTI VITAMIN DAILY PO) Take by mouth daily.   Yes Historical Provider, MD  valsartan-hydrochlorothiazide (DIOVAN-HCT) 160-12.5 MG tablet TAKE 1 TABLET BY MOUTH DAILY 02/08/15  Yes Robyn Haber, MD  cephALEXin (KEFLEX) 500 MG capsule Take 1 capsule (500 mg total) by mouth 2 (two) times daily. 02/15/15   Antonietta Breach, PA-C  levothyroxine (SYNTHROID, LEVOTHROID)  100 MCG tablet TAKE 1 TABLET BY MOUTH EVERY DAY Patient not taking: Reported on 02/15/2015 02/02/14   Robyn Haber, MD  levothyroxine (SYNTHROID, LEVOTHROID) 100 MCG tablet Take 1 tablet (100 mcg total) by mouth daily. PATIENT NEEDS OFFICE VISIT FOR ADDITIONAL REFILLS Patient not taking: Reported on 02/15/2015 01/18/15   Robyn Haber, MD   BP 149/71 mmHg  Pulse 60  Temp(Src) 97.8 F (36.6 C) (Oral)  Resp 18  SpO2 99%   Physical Exam  Constitutional: She is oriented to person, place, and time. She appears well-developed and well-nourished. No distress.  Pleasant, nontoxic appearing  HENT:  Head: Normocephalic and atraumatic.  Eyes: Conjunctivae and EOM are normal. No scleral icterus.  Neck: Normal range of motion.  Cardiovascular: Normal rate, regular rhythm and intact distal pulses.   Pulmonary/Chest: Effort normal and breath sounds normal. No respiratory distress. She has no wheezes. She has no rales.  Respirations even and unlabored. Lungs CTAB.  Abdominal: Soft. She exhibits no distension. There is no tenderness. There is no rebound and no guarding.  Soft abdomen. No TTP. No tenderness at McBurney's point. No masses or peritoneal signs.  Musculoskeletal: Normal range of motion.  Neurological: She is alert and oriented to person, place, and time. She exhibits normal muscle tone. Coordination normal.  Ambulatory with steady gait.  Skin: Skin is warm and dry. No rash noted. She is not diaphoretic. No erythema. No pallor.  Psychiatric: She has a normal mood and affect. Her behavior is normal.  Nursing note and vitals reviewed.   ED Course  Procedures (including critical care time) Labs Review Labs Reviewed  COMPREHENSIVE METABOLIC PANEL - Abnormal; Notable for the following:    Glucose, Bld 104 (*)    Total Protein 8.2 (*)    All other components within normal limits  URINALYSIS, ROUTINE W REFLEX MICROSCOPIC (NOT AT [facility]) - Abnormal; Notable for the following:    APPearance  CLOUDY (*)    Nitrite POSITIVE (*)    Leukocytes, UA SMALL (*)    All other components within normal limits  URINE MICROSCOPIC-ADD ON - Abnormal; Notable for the following:    Squamous Epithelial / LPF 0-5 (*)    Bacteria, UA MANY (*)    All other components within normal limits  URINE CULTURE  LIPASE, BLOOD  CBC    Imaging Review No results found.   I have personally reviewed and evaluated these images and lab results as part of my medical decision-making.   EKG Interpretation None      MDM   Final diagnoses:  UTI (lower urinary tract infection)    Pt has been diagnosed with a UTI. Pt is afebrile, no CVA tenderness, no abdominal TTP. She is normotensive and denies N/V. No concern for appendicitis, especially given lack of tenderness and leukocytosis as well as chronicity of symptoms. Pt to be discharged home with antibiotics and instructions to follow up with PCP if symptoms persist.  Return precautions given at discharge.  Patient agreeable to plan with no unaddressed concerns.   Filed Vitals:   02/15/15 1948 02/15/15 2000 02/15/15 2030 02/15/15 2045  BP: 173/85 144/71 144/69 149/71  Pulse: 72 67 64 60  Temp:      TempSrc:      Resp: 18     SpO2: 99% 100% 99% 99%     Antonietta Breach, PA-C 02/15/15 2055  Nat Christen, MD 02/15/15 2215

## 2015-02-15 NOTE — ED Provider Notes (Addendum)
CSN: [id number]     Arrival date & time 02/15/15  1424 History   First MD Initiated Contact with Patient 02/15/15 1619     Chief Complaint  Patient presents with  . Abdominal Pain   (Consider location/radiation/quality/duration/timing/severity/associated sxs/prior Treatment) Patient is a 75 y.o. female presenting with abdominal pain. The history is provided by the patient and the spouse.  Abdominal Pain Pain location:  Suprapubic Pain quality: cramping   Pain radiates to:  Does not radiate Pain severity:  Mild Progression:  Partially resolved (was worse last eve with walking) Chronicity:  New Context comment:  S/p 10d of clindamycin for dental infection on fri Relieved by:  None tried Worsened by:  Nothing tried Ineffective treatments:  None tried Associated symptoms: nausea   Associated symptoms: no constipation, no diarrhea, no dysuria, no fever, no hematuria, no melena and no vomiting   Risk factors: not pregnant     Past Medical History  Diagnosis Date  . Comedone   . Arthritis   . Cataract   . Hypertension   . Thyroid disease     thyroidectomy in 2011   Past Surgical History  Procedure Laterality Date  . Eye surgery    . Tonsillectomy and adenoidectomy    . Cataract extraction    . Thyroidectomy  12/25/2009  . 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 Total hip arthroplasty Right 06/03/2008   Family History  Problem Relation Age of Onset  . Stroke Mother   . Cancer Father   . Cancer Sister   . Miscarriages / Stillbirths Neg Hx   . Cancer Sister   . Cancer Sister    Social History  Substance Use Topics  . Smoking status: Never Smoker   . Smokeless tobacco: Never Used  . Alcohol Use: No   OB History    No data available     Review of Systems  Constitutional: Negative.  Negative for fever.  Gastrointestinal: Positive for nausea and abdominal pain. Negative for vomiting, diarrhea, constipation and melena.  Genitourinary: Positive for pelvic pain.  Negative for dysuria and hematuria.  Musculoskeletal: Negative.   All other systems reviewed and are negative.   Allergies  Review of patient's allergies indicates no known allergies.  Home Medications   Prior to Admission medications   Medication Sig Start Date End Date Taking? Authorizing Provider  Acetaminophen (TYLENOL PO) Take by mouth as needed.    Historical Provider, MD  aspirin 81 MG tablet Take 81 mg by mouth daily.    Historical Provider, MD  atorvastatin (LIPITOR) 20 MG tablet TAKE 1 TABLET (20 MG TOTAL) BY MOUTH DAILY. PATIENT NEEDS OFFICE VISIT FOR ADDITIONAL REFILLS 02/04/15   Ezekiel Slocumb, PA-C  calcium carbonate (OS-CAL) 600 MG TABS Take 600 mg by mouth 2 (two) times daily with a meal.    Historical Provider, MD  Cholecalciferol (VITAMIN D-3) 5000 UNITS TABS Take by mouth daily.    Historical Provider, MD  famotidine (PEPCID) 10 MG tablet Take 10 mg by mouth 2 (two) times daily.    Historical Provider, MD  fish oil-omega-3 fatty acids 1000 MG capsule Take 2 g by mouth daily.    Historical Provider, MD  levothyroxine (SYNTHROID, LEVOTHROID) 100 MCG tablet Take 1 tablet (100 mcg total) by mouth daily. Patient not taking: Reported on 06/04/2014 01/22/14   Robyn Haber, MD  levothyroxine (SYNTHROID, LEVOTHROID) 100 MCG tablet TAKE 1 TABLET BY MOUTH EVERY DAY 02/02/14   Robyn Haber, MD  levothyroxine (SYNTHROID, LEVOTHROID) 100  MCG tablet Take 1 tablet (100 mcg total) by mouth daily. PATIENT NEEDS OFFICE VISIT FOR ADDITIONAL REFILLS 01/18/15   Robyn Haber, MD  Multiple Vitamin (MULTI VITAMIN DAILY PO) Take by mouth daily.    Historical Provider, MD  omeprazole (PRILOSEC) 20 MG capsule Take 20 mg by mouth 2 (two) times daily before a meal.     Historical Provider, MD  valsartan-hydrochlorothiazide (DIOVAN-HCT) 160-12.5 MG tablet TAKE 1 TABLET BY MOUTH DAILY 02/08/15   Robyn Haber, MD   Meds Ordered and Administered this Visit  Medications - No data to  display  BP 154/75 mmHg  Pulse 84  Temp(Src) 98.3 F (36.8 C) (Oral)  Resp 18  SpO2 98% No data found.   Physical Exam  Constitutional: She is oriented to person, place, and time. She appears well-developed and well-nourished.  Abdominal: Soft. Bowel sounds are normal. She exhibits no distension and no mass. There is tenderness. There is no rigidity, no rebound, no guarding, no CVA tenderness and no tenderness at McBurney's point.    Neurological: She is alert and oriented to person, place, and time.  Skin: Skin is warm and dry.  Nursing note and vitals reviewed.   ED Course  Procedures (including critical care time)  Labs Review Labs Reviewed  POCT URINALYSIS DIP (DEVICE) - Abnormal; Notable for the following:    Leukocytes, UA TRACE (*)    All other components within normal limits    Imaging Review No results found.   Visual Acuity Review  Right Eye Distance:   Left Eye Distance:   Bilateral Distance:    Right Eye Near:   Left Eye Near:    Bilateral Near:         MDM   1. Right lower quadrant abdominal pain    Sent for eval of abd pain for 2d.     Billy Fischer, MD 02/15/15 [place], MD 02/15/15 [phone]

## 2015-02-15 NOTE — ED Notes (Signed)
Per pt, she c/o RLQ pain x 1 week, states it's intermittent and worse when walking. Pt recently finished a course of antibiotics from the dentist. Pt's husband is concerned because the pain started after antibiotics were complete.

## 2015-02-15 NOTE — ED Notes (Signed)
Pt c/o RLQ abd pain x 3 days. Denies N/V/D. Pt states that's he started antibiotics for dental pain 10 days ago.

## 2015-02-15 NOTE — Discharge Instructions (Signed)

## 2015-02-15 NOTE — ED Notes (Signed)
Pt  Reports  Low  abd  Pain   started  3   Days  Ago      Pt  Reports       Some           Burning  On  Urination              Recently  On  Anti  Biotics       For  Tooth  Infection     denys  Any  diarrhea

## 2015-02-17 LAB — URINE CULTURE

## 2015-02-18 ENCOUNTER — Other Ambulatory Visit: Payer: Self-pay | Admitting: *Deleted

## 2015-02-18 ENCOUNTER — Telehealth (HOSPITAL_COMMUNITY): Payer: Self-pay

## 2015-02-18 DIAGNOSIS — E039 Hypothyroidism, unspecified: Secondary | ICD-10-CM

## 2015-02-18 MED ORDER — LEVOTHYROXINE SODIUM 100 MCG PO TABS: 100.0000 ug | ORAL_TABLET | Freq: Every day | ORAL | Status: DC

## 2015-02-18 MED ORDER — ATORVASTATIN CALCIUM 20 MG PO TABS: ORAL_TABLET | ORAL | Status: DC

## 2015-02-18 NOTE — Telephone Encounter (Signed)
Post ED Visit - Positive Culture Follow-up  Culture report reviewed by antimicrobial stewardship pharmacist:  []  Elenor Quinones, Pharm.D. []  Heide Guile, Pharm.D., BCPS []  Parks Neptune, Pharm.D. []  Alycia Rossetti, Pharm.D., BCPS []  [place], Pharm.D., BCPS, AAHIVP []  Legrand Como, Pharm.D., BCPS, AAHIVP [x]  Milus Glazier, Pharm.D. []  Rob Evette Doffing, Pharm.D.  Positive urine culture, >/= 100,000 colonies -> Klebsiella Pneumoniae Treated with Cephalexin, organism sensitive to the same and no further patient follow-up is required at this time.  Dortha Kern 02/18/2015, 12:44 PM

## 2015-02-24 ENCOUNTER — Encounter: Payer: Self-pay | Admitting: Family Medicine

## 2015-02-24 ENCOUNTER — Ambulatory Visit (INDEPENDENT_AMBULATORY_CARE_PROVIDER_SITE_OTHER): Payer: Medicare Other | Admitting: Family Medicine

## 2015-02-24 VITALS — BP 152/79 | HR 82 | Temp 97.6°F | Resp 16 | Ht 65.5 in | Wt 221.8 lb

## 2015-02-24 DIAGNOSIS — R319 Hematuria, unspecified: Secondary | ICD-10-CM

## 2015-02-24 DIAGNOSIS — N898 Other specified noninflammatory disorders of vagina: Secondary | ICD-10-CM

## 2015-02-24 DIAGNOSIS — L298 Other pruritus: Secondary | ICD-10-CM | POA: Diagnosis not present

## 2015-02-24 DIAGNOSIS — Z87440 Personal history of urinary (tract) infections: Secondary | ICD-10-CM

## 2015-02-24 LAB — POCT URINALYSIS DIP (MANUAL ENTRY)
BILIRUBIN UA: NEGATIVE
Bilirubin, UA: NEGATIVE
Glucose, UA: NEGATIVE
Leukocytes, UA: NEGATIVE
Nitrite, UA: NEGATIVE
PH UA: 5.5
PROTEIN UA: NEGATIVE
UROBILINOGEN UA: 0.2

## 2015-02-24 LAB — POCT WET + KOH PREP
TRICH BY WET PREP: ABSENT
YEAST BY WET PREP: ABSENT
Yeast by KOH: ABSENT

## 2015-02-24 LAB — POC MICROSCOPIC URINALYSIS (UMFC): Mucus: ABSENT

## 2015-02-24 MED ORDER — FLUCONAZOLE 150 MG PO TABS: 150.0000 mg | ORAL_TABLET | Freq: Once | ORAL | Status: DC

## 2015-02-24 NOTE — Progress Notes (Signed)
[facility] [address] [place] [address] [phone]  Date:  02/24/2015   Name:  Anne [name]   DOB:  1941/01/20   MRN:  [id number]  PCP:  Robyn Haber, MD    Chief Complaint: Hospitalization Follow-up; Vaginal Itching; and Hematuria   History of Present Illness:  Anne [name] is a 75 y.o. very pleasant female patient who presents with the following:  She was seen in the ER on 1/2 with lower abd pain. She was noted to have a likely UTI and was treated with keflex 500 BID for one week.  Urine culture showed klebsielle sensitiave to keflex .  She notes that she did have the RLQ pain again about 2 days ago.  It has stopped now and she currently feels well She has noted some vaginal itching now for the last day or two.   Last night she noted a trace of blood on the TP after she urinated and wiped.   No dysuria.    She has been eating ok.  No vomiting or fever noted.   She also recently used clindamycin for a dental infection Patient Active Problem List   Diagnosis Date Noted  . Chest pressure 03/03/2013  . Globus sensation 03/03/2013  . DOE (dyspnea on exertion) 03/03/2013  . Thyroid cancer ([place]) 01/05/2012  . Hyperlipidemia 01/05/2012  . Dysphonia 01/05/2012  . 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 Hypertension 01/04/2012  . Hypothyroid 01/04/2012    Past Medical History  Diagnosis Date  . Comedone   . Arthritis   . Cataract   . Hypertension   . Thyroid disease     thyroidectomy in 2011    Past Surgical History  Procedure Laterality Date  . Eye surgery    . Tonsillectomy and adenoidectomy    . Cataract extraction    . Thyroidectomy  12/25/2009  . 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 Total hip arthroplasty Right 06/03/2008    Social History  Substance Use Topics  . Smoking status: Never Smoker   . Smokeless tobacco: Never Used  . Alcohol Use: No    Family History  Problem Relation Age of Onset  . Stroke Mother    . Cancer Father   . Cancer Sister   . Miscarriages / Stillbirths Neg Hx   . Cancer Sister   . Cancer Sister     No Known Allergies  Medication list has been reviewed and updated.  Current Outpatient Prescriptions on File Prior to Visit  Medication Sig Dispense Refill  . acetaminophen (TYLENOL) 500 MG tablet Take 500 mg by mouth every 6 (six) hours as needed for mild pain.    Marland Kitchen aspirin 81 MG tablet Take 81 mg by mouth daily.    Marland Kitchen atorvastatin (LIPITOR) 20 MG tablet TAKE 1 TABLET (20 MG TOTAL) BY MOUTH DAILY. PATIENT NEEDS OFFICE VISIT FOR ADDITIONAL REFILLS 30 tablet 0  . calcium carbonate (OS-CAL) 600 MG TABS Take 600 mg by mouth 2 (two) times daily with a meal.    . famotidine (PEPCID) 10 MG tablet Take 10 mg by mouth 2 (two) times daily.    . fish oil-omega-3 fatty acids 1000 MG capsule Take 2 g by mouth daily.    Marland Kitchen levothyroxine (SYNTHROID, LEVOTHROID) 100 MCG tablet Take 1 tablet (100 mcg total) by mouth daily. 30 tablet 0  . valsartan-hydrochlorothiazide (DIOVAN-HCT) 160-12.5 MG tablet TAKE 1 TABLET BY MOUTH DAILY 90 tablet 0  . cephALEXin (KEFLEX) 500 MG capsule  Take 1 capsule (500 mg total) by mouth 2 (two) times daily. (Patient not taking: Reported on 02/24/2015) 14 capsule 0  . Cholecalciferol (VITAMIN D-3) 5000 UNITS TABS Take by mouth daily. Reported on 02/24/2015    . levothyroxine (SYNTHROID, LEVOTHROID) 100 MCG tablet TAKE 1 TABLET BY MOUTH EVERY DAY (Patient not taking: Reported on 02/15/2015) 90 tablet 3  . levothyroxine (SYNTHROID, LEVOTHROID) 100 MCG tablet Take 1 tablet (100 mcg total) by mouth daily. PATIENT NEEDS OFFICE VISIT FOR ADDITIONAL REFILLS (Patient not taking: Reported on 02/15/2015) 30 tablet 0  . Multiple Vitamin (MULTI VITAMIN DAILY PO) Take by mouth daily. Reported on 02/24/2015     No current facility-administered medications on file prior to visit.    Review of Systems:  As per HPI- otherwise negative.   Physical Examination: Filed Vitals:    02/24/15 1126  BP: 152/79  Pulse: 82  Temp: 97.6 F (36.4 C)  Resp: 16   Filed Vitals:   02/24/15 1126  Height: 5' 5.5" (1.664 m)  Weight: 221 lb 12.8 oz (100.608 kg)   Body mass index is 36.34 kg/(m^2). Ideal Body Weight: Weight in (lb) to have BMI = 25: 152.2  GEN: WDWN, NAD, Non-toxic, A & O x 3, obese, looks well HEENT: Atraumatic, Normocephalic. Neck supple. No masses, No LAD. Ears and Nose: No external deformity. CV: RRR, No M/G/R. No JVD. No thrill. No extra heart sounds. PULM: CTA B, no wheezes, crackles, rhonchi. No retractions. No resp. distress. No accessory muscle use. ABD: S, NT, ND, +BS. No rebound. No HSM.  Belly is benign today EXTR: No c/c/e NEURO Normal gait.  PSYCH: Normally interactive. Conversant. Not depressed or anxious appearing.  Calm demeanor.  Pelvic: normal external exam for age with some atrophy and mild redness but no CMP or vaginal canal tenderness.  Performed BME but not speculum exam  Results for orders placed or performed in visit on 02/24/15  POCT urinalysis dipstick  Result Value Ref Range   Color, UA yellow yellow   Clarity, UA clear clear   Glucose, UA negative negative   Bilirubin, UA negative negative   Ketones, POC UA negative negative   Spec Grav, UA <=1.005    Blood, UA trace-intact (A) negative   pH, UA 5.5    Protein Ur, POC negative negative   Urobilinogen, UA 0.2    Nitrite, UA Negative Negative   Leukocytes, UA Negative Negative  POCT Microscopic Urinalysis ([facility])  Result Value Ref Range   WBC,UR,HPF,POC None None WBC/hpf   RBC,UR,HPF,POC None None RBC/hpf   Bacteria None None, Too numerous to count   Mucus Absent Absent   Epithelial Cells, UR Per Microscopy Few (A) None, Too numerous to count cells/hpf  POCT Wet + KOH Prep  Result Value Ref Range   Yeast by KOH Absent Present, Absent   Yeast by wet prep Absent Present, Absent   WBC by wet prep None None, Few, Too numerous to count   Clue Cells Wet Prep HPF POC None  None, Too numerous to count   Trich by wet prep Absent Present, Absent   Bacteria Wet Prep HPF POC Few None, Few, Too numerous to count   Epithelial Cells By [employer] Pref ([facility]) Moderate (A) None, Few, Too numerous to count   RBC,UR,HPF,POC None None RBC/hpf    Assessment and Plan: Vaginal itching - Plan: POCT Wet + KOH Prep, fluconazole (DIFLUCAN) 150 MG tablet  History of UTI - Plan: POCT urinalysis dipstick, POCT Microscopic Urinalysis ([facility]),  Urine culture  Blood in urine - Plan: Urine culture  Here today to evaluate vaginal itching.  This may be due to yeast vaginitis from recent abx use although her WP is negative.  Will treat with diflucan 150, repeat in one week Suspect trace of blood was from the vaginal tissues but will check on her urine culture  She has a follow-up visit with me in about 2 weeks already scheduled   Signed Lamar Blinks, MD

## 2015-02-24 NOTE — Patient Instructions (Signed)
I suspect that you have some vaginal yeast due to recent antibiotic use Take a diflucan (yeast pill) and repeat in one week if needed If this does not resolve your symptoms please let me know!  I will also recheck your urine culture

## 2015-02-25 LAB — URINE CULTURE

## 2015-03-02 ENCOUNTER — Encounter: Payer: Self-pay | Admitting: Family Medicine

## 2015-03-10 ENCOUNTER — Encounter: Payer: Self-pay | Admitting: Family Medicine

## 2015-03-10 ENCOUNTER — Ambulatory Visit (INDEPENDENT_AMBULATORY_CARE_PROVIDER_SITE_OTHER): Payer: Medicare Other | Admitting: Family Medicine

## 2015-03-10 VITALS — BP 138/73 | HR 74 | Temp 97.8°F | Resp 16 | Ht 66.5 in | Wt 221.2 lb

## 2015-03-10 DIAGNOSIS — E785 Hyperlipidemia, unspecified: Secondary | ICD-10-CM

## 2015-03-10 DIAGNOSIS — E039 Hypothyroidism, unspecified: Secondary | ICD-10-CM

## 2015-03-10 DIAGNOSIS — R7309 Other abnormal glucose: Secondary | ICD-10-CM

## 2015-03-10 DIAGNOSIS — I10 Essential (primary) hypertension: Secondary | ICD-10-CM

## 2015-03-10 DIAGNOSIS — K219 Gastro-esophageal reflux disease without esophagitis: Secondary | ICD-10-CM | POA: Diagnosis not present

## 2015-03-10 DIAGNOSIS — E890 Postprocedural hypothyroidism: Secondary | ICD-10-CM

## 2015-03-10 DIAGNOSIS — Z0000 Encounter for general adult medical examination without abnormal findings: Secondary | ICD-10-CM

## 2015-03-10 DIAGNOSIS — E119 Type 2 diabetes mellitus without complications: Secondary | ICD-10-CM | POA: Diagnosis not present

## 2015-03-10 LAB — LIPID PANEL
CHOL/HDL RATIO: 3.1 ratio (ref <=5.0)
CHOLESTEROL: 151 mg/dL (ref 125–200)
HDL: 48 mg/dL (ref >=46)
LDL CALC: 82 mg/dL (ref <130)
TRIGLYCERIDES: 103 mg/dL (ref <150)
VLDL: 21 mg/dL (ref <30)

## 2015-03-10 MED ORDER — FAMOTIDINE 10 MG PO TABS: 10.0000 mg | ORAL_TABLET | Freq: Two times a day (BID) | ORAL | Status: DC

## 2015-03-10 MED ORDER — VALSARTAN-HYDROCHLOROTHIAZIDE 160-12.5 MG PO TABS: ORAL_TABLET | ORAL | Status: DC

## 2015-03-10 MED ORDER — ATORVASTATIN CALCIUM 20 MG PO TABS: ORAL_TABLET | ORAL | Status: DC

## 2015-03-10 MED ORDER — LEVOTHYROXINE SODIUM 100 MCG PO TABS: 100.0000 ug | ORAL_TABLET | Freq: Every day | ORAL | Status: DC

## 2015-03-10 NOTE — Progress Notes (Addendum)
[facility] [address] [place] [address] [phone]  Date:  03/10/2015   Name:  Anne [name]   DOB:  03-13-1940   MRN:  [id number]  PCP:  Robyn Haber, MD    Chief Complaint: Annual Exam   History of Present Illness:  Anne [name] is a 75 y.o. very pleasant female patient who presents with the following:  Here today seeking a CPE.  History of HTN, hypothyroidism (thyroidectomy due to thyroid cancer 2011), hyperlipidemia.   Last CMP and CBC recently.  Last cholesterol in March of 2016.  Her vaginal and urinary sx are resolved.  We treated her with diflucan at her last visit and this seems to have helped I did a pelvic exam at her last visit   She takes pepcid once or twice a day depending on what she eats.   She is fasting today for labs   Last colonoscopy about 2007- however they are not quite sure about this date, it may be more recent  Here today with her husband They have no particular concerns today She does want to lose some weight and we discussed this- discussed diet and exercise changes that may help   Lab Results  Component Value Date   TSH 1.247 04/24/2014     Patient Active Problem List   Diagnosis Date Noted  . Chest pressure 03/03/2013  . Globus sensation 03/03/2013  . DOE (dyspnea on exertion) 03/03/2013  . Thyroid cancer ([place]) 01/05/2012  . Hyperlipidemia 01/05/2012  . Dysphonia 01/05/2012  . 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 Hypertension 01/04/2012  . Hypothyroid 01/04/2012    Past Medical History  Diagnosis Date  . Comedone   . Arthritis   . Cataract   . Hypertension   . Thyroid disease     thyroidectomy in 2011    Past Surgical History  Procedure Laterality Date  . Eye surgery    . Tonsillectomy and adenoidectomy    . Cataract extraction    . Thyroidectomy  12/25/2009  . 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 Total hip arthroplasty Right 06/03/2008    Social History  Substance Use  Topics  . Smoking status: Never Smoker   . Smokeless tobacco: Never Used  . Alcohol Use: No    Family History  Problem Relation Age of Onset  . Stroke Mother   . Cancer Father   . Cancer Sister   . Miscarriages / Stillbirths Neg Hx   . Cancer Sister   . Cancer Sister     No Known Allergies  Medication list has been reviewed and updated.  Current Outpatient Prescriptions on File Prior to Visit  Medication Sig Dispense Refill  . acetaminophen (TYLENOL) 500 MG tablet Take 500 mg by mouth every 6 (six) hours as needed for mild pain.    Marland Kitchen aspirin 81 MG tablet Take 81 mg by mouth daily.    Marland Kitchen atorvastatin (LIPITOR) 20 MG tablet TAKE 1 TABLET (20 MG TOTAL) BY MOUTH DAILY. PATIENT NEEDS OFFICE VISIT FOR ADDITIONAL REFILLS 30 tablet 0  . calcium carbonate (OS-CAL) 600 MG TABS Take 600 mg by mouth 2 (two) times daily with a meal.    . Cholecalciferol (VITAMIN D-3) 5000 UNITS TABS Take by mouth daily. Reported on 02/24/2015    . famotidine (PEPCID) 10 MG tablet Take 10 mg by mouth 2 (two) times daily.    . fish oil-omega-3 fatty acids 1000 MG capsule Take 2 g by mouth daily.    Marland Kitchen  fluconazole (DIFLUCAN) 150 MG tablet Take 1 tablet (150 mg total) by mouth once. Repeat in one week if needed 2 tablet 0  . levothyroxine (SYNTHROID, LEVOTHROID) 100 MCG tablet Take 1 tablet (100 mcg total) by mouth daily. 30 tablet 0  . Multiple Vitamin (MULTI VITAMIN DAILY PO) Take by mouth daily. Reported on 02/24/2015    . valsartan-hydrochlorothiazide (DIOVAN-HCT) 160-12.5 MG tablet TAKE 1 TABLET BY MOUTH DAILY 90 tablet 0   No current facility-administered medications on file prior to visit.    Review of Systems:  As per HPI- otherwise negative.   Physical Examination: Filed Vitals:   03/10/15 1323  BP: 156/76  Pulse: 74  Temp: 97.8 F (36.6 C)  Resp: 16   Filed Vitals:   03/10/15 1323  Height: 5' 6.5" (1.689 m)  Weight: 221 lb 3.2 oz (100.336 kg)   Body mass index is 35.17 kg/(m^2). Ideal  Body Weight: Weight in (lb) to have BMI = 25: 156.9  GEN: WDWN, NAD, Non-toxic, A & O x 3, obese, looks well HEENT: Atraumatic, Normocephalic. Neck supple. No masses, No LAD.  Bilateral TM wnl, oropharynx normal.  PEERL,EOMI.   Ears and Nose: No external deformity. CV: RRR, No M/G/R. No JVD. No thrill. No extra heart sounds. PULM: CTA B, no wheezes, crackles, rhonchi. No retractions. No resp. distress. No accessory muscle use. ABD: S, NT, ND. No rebound. No HSM. EXTR: No c/c/e NEURO Normal gait.  PSYCH: Normally interactive. Conversant. Not depressed or anxious appearing.  Calm demeanor.  Breast: normal exam, no masses/ dimpling/ discharge   Assessment and Plan: Physical exam  Hypothyroidism, unspecified hypothyroidism type - Plan: levothyroxine (SYNTHROID, LEVOTHROID) 100 MCG tablet  Hyperlipidemia - Plan: atorvastatin (LIPITOR) 20 MG tablet, Lipid panel  Gastroesophageal reflux disease without esophagitis - Plan: famotidine (PEPCID) 10 MG tablet  Essential hypertension - Plan: valsartan-hydrochlorothiazide (DIOVAN-HCT) 160-12.5 MG tablet  Elevated glucose - Plan: Hemoglobin A1c  Postoperative hypothyroidism - Plan: levothyroxine (SYNTHROID, LEVOTHROID) 100 MCG tablet, TSH  Here today for a CPE Refilled her medications and ordered labs as above Will be in touch with her pending labs They will double check on their records at home re: her colonoscopy and will be in touch if she is due  Signed Lamar Blinks, MD  Called with labs 1/27- was not able to reach, will try again  Results for orders placed or performed in visit on 03/10/15  Lipid panel  Result Value Ref Range   Cholesterol 151 125 - 200 mg/dL   Triglycerides 103 <150 mg/dL   HDL 48 >=46 mg/dL   Total CHOL/HDL Ratio 3.1 <=5.0 Ratio   VLDL 21 <30 mg/dL   LDL Cholesterol 82 <130 mg/dL  TSH  Result Value Ref Range   TSH 4.314 0.350 - 4.500 uIU/mL  Hemoglobin A1c  Result Value Ref Range   Hgb A1c MFr Bld 6.8  (H) <5.7 %   Mean Plasma Glucose 148 (H) <117 mg/dL   Cholesterol and thyroid are ok but her A1c now indicates DM.  Her GFR is over [id number] so she can certainly use metfomrin Called again on 1/31- no answer.  Will mail letter  addnd 2/15-  Pt responded that she would like to go on metformin,  I will rx this for her.  Will just just 500 mg daily as her A1c is not very high. Asked her to come in for a recheck in 4 months or so and she agreed

## 2015-03-10 NOTE — Patient Instructions (Addendum)
It was great to see you today!  I will be in touch with your labs It is about time to have a repeat colonoscopy- if you like I can make a referral for you.   Your blood pressure looks ok- I would recommend that you try and lose about 10 lbs to keep your BP under control  It would be my pleasure to continue to see you as a patient at my new office, starting the last week of February.  If you prefer to remain at [facility] one of my partners will be happy to see you here.   [facility] at [facility] [address] [place], [place] [address] Phone: [phone]

## 2015-03-11 LAB — HEMOGLOBIN A1C
Hgb A1c MFr Bld: 6.8 % — ABNORMAL HIGH (ref <5.7)
MEAN PLASMA GLUCOSE: 148 mg/dL — AB (ref <117)

## 2015-03-11 LAB — TSH: TSH: 4.314 u[IU]/mL (ref 0.350–4.500)

## 2015-03-14 ENCOUNTER — Other Ambulatory Visit: Payer: Self-pay | Admitting: Family Medicine

## 2015-03-16 ENCOUNTER — Encounter: Payer: Self-pay | Admitting: Family Medicine

## 2015-03-24 ENCOUNTER — Telehealth: Payer: Self-pay | Admitting: Family Medicine

## 2015-03-24 DIAGNOSIS — E785 Hyperlipidemia, unspecified: Secondary | ICD-10-CM

## 2015-03-24 MED ORDER — ATORVASTATIN CALCIUM 20 MG PO TABS: ORAL_TABLET | ORAL | Status: DC

## 2015-03-24 NOTE — Telephone Encounter (Signed)
Called and [facility]- sorry I was not able to connect with her my phone. Yes, continue same dose of thyroid med I had refilled her atorvastatin but perhaps it did not go through.  Will do this again It looks like she is due for a colonoscopy this year- let me know if they need any help setting this up

## 2015-03-24 NOTE — Telephone Encounter (Signed)
Patient is leaving a detailed message for Dr. Lorelei Pont  1.) She would like to know her lab results. She stated that Dr. Lorelei Pont may have to change her thyroid medication depending upon these lab results.  2.) She needs a refill on her cholesterol medicine. She uses [facility] on [address] in [place].  3.) She wants Dr. Lorelei Pont to know that her last colonoscopy was 10/31/2005.   [phone]

## 2015-03-29 ENCOUNTER — Telehealth: Payer: Self-pay

## 2015-03-29 NOTE — Telephone Encounter (Signed)
Pt states Dr Lorelei Pont told her she wanted her to start on a different medication and she said it would be fine. Please call her cell at [place]

## 2015-03-30 NOTE — Telephone Encounter (Signed)
Please advise 

## 2015-03-31 DIAGNOSIS — E119 Type 2 diabetes mellitus without complications: Secondary | ICD-10-CM | POA: Insufficient documentation

## 2015-03-31 MED ORDER — METFORMIN HCL 500 MG PO TABS: ORAL_TABLET | ORAL | Status: DC

## 2015-03-31 NOTE — Telephone Encounter (Signed)
Take care of- see previous OV addendum

## 2015-03-31 NOTE — Telephone Encounter (Signed)
Pt calling checking status of this message, she would like a CB at [phone]

## 2015-03-31 NOTE — Addendum Note (Signed)
Addended by: Lamar Blinks C on: 03/31/2015 02:38 PM   Modules accepted: Orders

## 2015-06-07 DIAGNOSIS — R7309 Other abnormal glucose: Secondary | ICD-10-CM | POA: Diagnosis not present

## 2015-06-07 DIAGNOSIS — H2511 Age-related nuclear cataract, right eye: Secondary | ICD-10-CM | POA: Diagnosis not present

## 2015-06-07 DIAGNOSIS — H25011 Cortical age-related cataract, right eye: Secondary | ICD-10-CM | POA: Diagnosis not present

## 2015-06-07 DIAGNOSIS — H5203 Hypermetropia, bilateral: Secondary | ICD-10-CM | POA: Diagnosis not present

## 2015-07-08 ENCOUNTER — Ambulatory Visit (INDEPENDENT_AMBULATORY_CARE_PROVIDER_SITE_OTHER): Payer: Medicare Other | Admitting: Family Medicine

## 2015-07-08 ENCOUNTER — Encounter: Payer: Self-pay | Admitting: Family Medicine

## 2015-07-08 VITALS — BP 160/74 | HR 76 | Temp 97.7°F | Ht 65.5 in | Wt 219.4 lb

## 2015-07-08 DIAGNOSIS — M79672 Pain in left foot: Secondary | ICD-10-CM | POA: Diagnosis not present

## 2015-07-08 DIAGNOSIS — E119 Type 2 diabetes mellitus without complications: Secondary | ICD-10-CM

## 2015-07-08 DIAGNOSIS — I10 Essential (primary) hypertension: Secondary | ICD-10-CM | POA: Diagnosis not present

## 2015-07-08 LAB — HEMOGLOBIN A1C: Hgb A1c MFr Bld: 6.6 % — ABNORMAL HIGH (ref 4.6–6.5)

## 2015-07-08 NOTE — Progress Notes (Signed)
[facility] [address] [place], [place] [address] [phone]  Date:  07/08/2015   Name:  Anne [name]   DOB:  November 27, 1940   MRN:  [id number]  PCP:  Lamar Blinks, MD    Chief Complaint: Follow-up   History of Present Illness:  Anne [name] is a 75 y.o. very pleasant female patient who presents with the following:  Last seen by myself in January for a physical exam.  We started her on metformin in January for mild DM.  She has not noted any SE and feels that she is tolerating this well. She takes it once a day and is trying to watch her diet  Lab Results  Component Value Date   HGBA1C 6.8* 03/10/2015   BP Readings from Last 3 Encounters:  07/08/15 160/74  03/10/15 138/73  02/24/15 152/79   She wonders if it is ok to use clindamycin which her DDS rx prior to cleanings- reassured her that his is ok  She will notice some pain in her left heel.  It may hurt more when she gets up in the morning. This pain has come and gone for a few months.  NKI.  Better with exercise  She did take her BP med this am- she got a bit nervous getting here today because they got lost and were late- we think this is why her BP is up She reports BP of 139- 140/ 70s at home; she does check it regularly  Recent TSH check ok   Lab Results  Component Value Date   TSH 4.314 03/10/2015     Patient Active Problem List   Diagnosis Date Noted  . Controlled type 2 diabetes mellitus without complication, without long-term current use of insulin ([place]) 03/31/2015  . Chest pressure 03/03/2013  . Globus sensation 03/03/2013  . DOE (dyspnea on exertion) 03/03/2013  . Thyroid cancer ([place]) 01/05/2012  . Hyperlipidemia 01/05/2012  . Dysphonia 01/05/2012  . 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 Hypertension 01/04/2012  . Hypothyroid 01/04/2012    Past Medical History  Diagnosis Date  . Comedone   . Arthritis   . Cataract   .  Hypertension   . Thyroid disease     thyroidectomy in 2011    Past Surgical History  Procedure Laterality Date  . Eye surgery    . Tonsillectomy and adenoidectomy    . Cataract extraction    . Thyroidectomy  12/25/2009  . 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 Total hip arthroplasty Right 06/03/2008  . Joint replacement      Social History  Substance Use Topics  . Smoking status: Never Smoker   . Smokeless tobacco: Never Used  . Alcohol Use: No    Family History  Problem Relation Age of Onset  . Stroke Mother   . Cancer Father   . Cancer Sister   . Miscarriages / Stillbirths Neg Hx   . Cancer Sister   . Cancer Sister     No Known Allergies  Medication list has been reviewed and updated.  Current Outpatient Prescriptions on File Prior to Visit  Medication Sig Dispense Refill  . acetaminophen (TYLENOL) 500 MG tablet Take 500 mg by mouth every 6 (six) hours as needed for mild pain.    Marland Kitchen aspirin 81 MG tablet Take 81 mg by mouth daily.    Marland Kitchen atorvastatin (LIPITOR) 20 MG tablet TAKE 1 TABLET (20 MG TOTAL) BY MOUTH  DAILY. 90 tablet 3  . Cholecalciferol (VITAMIN D-3) 5000 UNITS TABS Take by mouth daily. Reported on 02/24/2015    . famotidine (PEPCID) 10 MG tablet Take 1 tablet (10 mg total) by mouth 2 (two) times daily. 180 tablet 3  . levothyroxine (SYNTHROID, LEVOTHROID) 100 MCG tablet Take 1 tablet (100 mcg total) by mouth daily. 90 tablet 3  . metFORMIN (GLUCOPHAGE) 500 MG tablet Take 1 daily 90 tablet 3  . Multiple Vitamin (MULTI VITAMIN DAILY PO) Take by mouth daily. Reported on 02/24/2015    . valsartan-hydrochlorothiazide (DIOVAN-HCT) 160-12.5 MG tablet TAKE 1 TABLET BY MOUTH DAILY 90 tablet 3   No current facility-administered medications on file prior to visit.    Review of Systems:  As per HPI- otherwise negative.   Physical Examination: Filed Vitals:   07/08/15 1144 07/08/15 1156  BP: 165/77 160/74  Pulse: 76   Temp: 97.7 F (36.5 C)    Filed Vitals:    07/08/15 1144  Height: 5' 5.5" (1.664 m)  Weight: 219 lb 6.4 oz (99.519 kg)   Body mass index is 35.94 kg/(m^2). Ideal Body Weight: Weight in (lb) to have BMI = 25: 152.2  GEN: WDWN, NAD, Non-toxic, A & O x 3, obese, looks well HEENT: Atraumatic, Normocephalic. Neck supple. No masses, No LAD. Ears and Nose: No external deformity. CV: RRR, No M/G/R. No JVD. No thrill. No extra heart sounds. PULM: CTA B, no wheezes, crackles, rhonchi. No retractions. No resp. distress. No accessory muscle use. EXTR: No c/c/e NEURO Normal gait.  PSYCH: Normally interactive. Conversant. Not depressed or anxious appearing.  Calm demeanor.  Left foot- excellent pulses and cap refill.  No tenderness in any area of the foot at this time.  No swelling, redness or heat   Assessment and Plan: Controlled type 2 diabetes mellitus without complication, without long-term current use of insulin ([facility]) - Plan: Hemoglobin A1c  Essential hypertension  Heel pain, left  Here today to check her A1c since we started metformin in January - will follow-up with her pending results BP is elevated today likely due to stress.  She will continue to monitor and recheck at next visit Reassured about heel pain- likely benign origin.  Encouraged her to try stretching her foot prior to getting up in the morning   Signed Lamar Blinks, MD

## 2015-07-08 NOTE — Patient Instructions (Signed)
It was great to see you today- I will be in touch with your labs  Try to stretch out your foot prior to getting up in the morning to prevent your heel pain Continue taking your same medications for now  Let's check back in 4-6 months

## 2015-07-08 NOTE — Progress Notes (Signed)
Pre visit review using our clinic review tool, if applicable. No additional management support is needed unless otherwise documented below in the visit note. 

## 2015-11-10 ENCOUNTER — Ambulatory Visit (INDEPENDENT_AMBULATORY_CARE_PROVIDER_SITE_OTHER): Payer: Medicare Other | Admitting: Family Medicine

## 2015-11-10 ENCOUNTER — Encounter: Payer: Self-pay | Admitting: Family Medicine

## 2015-11-10 VITALS — BP 143/77 | HR 70 | Temp 97.7°F | Ht 65.5 in | Wt 215.2 lb

## 2015-11-10 DIAGNOSIS — Z5181 Encounter for therapeutic drug level monitoring: Secondary | ICD-10-CM | POA: Diagnosis not present

## 2015-11-10 DIAGNOSIS — E119 Type 2 diabetes mellitus without complications: Secondary | ICD-10-CM

## 2015-11-10 DIAGNOSIS — I10 Essential (primary) hypertension: Secondary | ICD-10-CM | POA: Diagnosis not present

## 2015-11-10 DIAGNOSIS — Z130 Encounter for screening for diseases of the blood and blood-forming organs and certain disorders involving the immune mechanism: Secondary | ICD-10-CM

## 2015-11-10 DIAGNOSIS — Z23 Encounter for immunization: Secondary | ICD-10-CM | POA: Diagnosis not present

## 2015-11-10 DIAGNOSIS — E039 Hypothyroidism, unspecified: Secondary | ICD-10-CM

## 2015-11-10 DIAGNOSIS — L720 Epidermal cyst: Secondary | ICD-10-CM

## 2015-11-10 NOTE — Progress Notes (Signed)
Pre visit review using our clinic review tool, if applicable. No additional management support is needed unless otherwise documented below in the visit note. 

## 2015-11-10 NOTE — Patient Instructions (Signed)
I will be in touch with your labs asap Take care and please see me in 4 months

## 2015-11-10 NOTE — Progress Notes (Signed)
[place] at [facility] [address] [place], [place] [address] [phone]  Date:  11/10/2015   Name:  Anne [name]   DOB:  1940/07/09   MRN:  [id number]  PCP:  Lamar Blinks, MD    Chief Complaint: Follow-up (Pt here f/u visit. Still tolerating Metformin well and watching diet. Flu vaccine today. )   History of Present Illness:  Anne [name] is a 75 y.o. very pleasant female patient who presents with the following:  Here today to follow-up on her DM- started metformin in January of 2017.  Lab Results  Component Value Date   HGBA1C 6.6 (H) 07/08/2015   She is also treated for hypertension.   Routine recheck today- she is feeling well.  However she has gotten a jury duty summons, would like an excuse for this. She has a hard time getting around without her husband and does not think she can navigate being on a jury alone She does not have any particular SE from her metformin.   She has checked her BP a few times at home, and also brought in her monitor  Recent home BP readings of 120/44, 132/48, 115/42, 135/60  She feels well, she has not felt dizzy or sick.   Checked her meter against ours today and got a similar number  She needs a flu shot today Eye exam is UTD [place], pneumonia shots are UTD  She has noted a white spot on her forehead for a few weeks or months. It does not hurt but does not want to go away   Patient Active Problem List   Diagnosis Date Noted  . Controlled type 2 diabetes mellitus without complication, without long-term current use of insulin ([place]) 03/31/2015  . Chest pressure 03/03/2013  . Globus sensation 03/03/2013  . DOE (dyspnea on exertion) 03/03/2013  . Thyroid cancer ([place]) 01/05/2012  . Hyperlipidemia 01/05/2012  . Dysphonia 01/05/2012  . 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 Hypertension 01/04/2012  . Hypothyroid 01/04/2012    Past Medical History:  Diagnosis Date  .  Arthritis   . Cataract   . Comedone   . Hypertension   . Thyroid disease    thyroidectomy in 2011    Past Surgical History:  Procedure Laterality Date  . CATARACT EXTRACTION    . EYE SURGERY    . JOINT REPLACEMENT    . THYROIDECTOMY  12/25/2009  . TONSILLECTOMY AND ADENOIDECTOMY    . 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 TOTAL HIP ARTHROPLASTY Right 06/03/2008    Social History  Substance Use Topics  . Smoking status: Never Smoker  . Smokeless tobacco: Never Used  . Alcohol use No    Family History  Problem Relation Age of Onset  . Stroke Mother   . Cancer Father   . Cancer Sister   . Miscarriages / Stillbirths Neg Hx   . Cancer Sister   . Cancer Sister     No Known Allergies  Medication list has been reviewed and updated.  Current Outpatient Prescriptions on File Prior to Visit  Medication Sig Dispense Refill  . acetaminophen (TYLENOL) 500 MG tablet Take 500 mg by mouth every 6 (six) hours as needed for mild pain.    Marland Kitchen aspirin 81 MG tablet Take 81 mg by mouth daily.    Marland Kitchen atorvastatin (LIPITOR) 20 MG tablet TAKE 1 TABLET (20 MG TOTAL) BY MOUTH DAILY. 90 tablet 3  . Cholecalciferol (VITAMIN D-3)  5000 UNITS TABS Take by mouth daily. Reported on 02/24/2015    . famotidine (PEPCID) 10 MG tablet Take 1 tablet (10 mg total) by mouth 2 (two) times daily. 180 tablet 3  . levothyroxine (SYNTHROID, LEVOTHROID) 100 MCG tablet Take 1 tablet (100 mcg total) by mouth daily. 90 tablet 3  . metFORMIN (GLUCOPHAGE) 500 MG tablet Take 1 daily 90 tablet 3  . Multiple Vitamin (MULTI VITAMIN DAILY PO) Take by mouth daily. Reported on 02/24/2015    . OVER THE COUNTER MEDICATION Citracal Calcium: Take one tablet by mouth daily.    . valsartan-hydrochlorothiazide (DIOVAN-HCT) 160-12.5 MG tablet TAKE 1 TABLET BY MOUTH DAILY 90 tablet 3   No current facility-administered medications on file prior to visit.     Review of Systems:  As per HPI- otherwise negative.   Physical  Examination: Vitals:   11/10/15 1349 11/10/15 1355  BP: (!) 149/76 (!) 143/77  Pulse: 70   Temp: 97.7 F (36.5 C)    Vitals:   11/10/15 1349  Weight: 215 lb 3.2 oz (97.6 kg)  Height: 5' 5.5" (1.664 m)   Body mass index is 35.27 kg/m. Ideal Body Weight: Weight in (lb) to have BMI = 25: 152.2  GEN: WDWN, NAD, Non-toxic, A & O x 3, obese, looks well HEENT: Atraumatic, Normocephalic. Neck supple. No masses, No LAD. Ears and Nose: No external deformity. CV: RRR, No M/G/R. No JVD. No thrill. No extra heart sounds. PULM: CTA B, no wheezes, crackles, rhonchi. No retractions. No resp. distress. No accessory muscle use. EXTR: No c/c/e NEURO Normal gait.  PSYCH: Normally interactive. Conversant. Not depressed or anxious appearing.  Calm demeanor.  Normal foot exam today Large milia on forehead.  Prepped with alcohol, unroofed with needle and expressed sebaceous material  Assessment and Plan: Controlled type 2 diabetes mellitus without complication, without long-term current use of insulin ([place]) - Plan: Comprehensive metabolic panel, Hemoglobin A1c  Hypothyroidism, unspecified hypothyroidism type - Plan: TSH  Essential hypertension - Plan: Comprehensive metabolic panel  Screening for deficiency anemia - Plan: CBC  Milia  Medication monitoring encounter - Plan: CBC  Encounter for immunization - Plan: Flu vaccine HIGH DOSE PF  Labs pending as above Flu shot today SBP is a little high today- however her home readings show a low DBP so will not increase any medications. She has not noted any sx of hypotension Removed milia as above Plan for a CPE in January of next year Did jury duty excuse for her  Signed Lamar Blinks, MD

## 2015-11-11 ENCOUNTER — Encounter: Payer: Self-pay | Admitting: Family Medicine

## 2015-11-11 LAB — COMPREHENSIVE METABOLIC PANEL
ALK PHOS: 72 U/L (ref 39–117)
ALT: 21 U/L (ref 0–35)
AST: 23 U/L (ref 0–37)
Albumin: 4.2 g/dL (ref 3.5–5.2)
BUN: 11 mg/dL (ref 6–23)
CO2: 28 meq/L (ref 19–32)
Calcium: 9.6 mg/dL (ref 8.4–10.5)
Chloride: 103 mEq/L (ref 96–112)
Creatinine, Ser: 0.75 mg/dL (ref 0.40–1.20)
GFR: 96.72 mL/min (ref >60.00)
GLUCOSE: 90 mg/dL (ref 70–99)
POTASSIUM: 3.8 meq/L (ref 3.5–5.1)
SODIUM: 140 meq/L (ref 135–145)
TOTAL PROTEIN: 7.9 g/dL (ref 6.0–8.3)
Total Bilirubin: 0.5 mg/dL (ref 0.2–1.2)

## 2015-11-11 LAB — CBC
HEMATOCRIT: 39.4 % (ref 36.0–46.0)
HEMOGLOBIN: 13.7 g/dL (ref 12.0–15.0)
MCHC: 34.7 g/dL (ref 30.0–36.0)
MCV: 94.2 fl (ref 78.0–100.0)
Platelets: 225 10*3/uL (ref 150.0–400.0)
RBC: 4.19 Mil/uL (ref 3.87–5.11)
RDW: 14.1 % (ref 11.5–15.5)
WBC: 4.9 10*3/uL (ref 4.0–10.5)

## 2015-11-11 LAB — TSH: TSH: 1.43 u[IU]/mL (ref 0.35–4.50)

## 2015-11-11 LAB — HEMOGLOBIN A1C: Hgb A1c MFr Bld: 6.5 % (ref 4.6–6.5)

## 2016-03-03 ENCOUNTER — Other Ambulatory Visit: Payer: Self-pay | Admitting: Family Medicine

## 2016-03-03 DIAGNOSIS — Z1231 Encounter for screening mammogram for malignant neoplasm of breast: Secondary | ICD-10-CM

## 2016-03-10 ENCOUNTER — Ambulatory Visit
Admission: RE | Admit: 2016-03-10 | Discharge: 2016-03-10 | Disposition: A | Discharge status: Home / Self Care | Payer: Medicare Other | Source: Ambulatory Visit | Attending: Family Medicine | Admitting: Family Medicine

## 2016-03-10 DIAGNOSIS — Z1231 Encounter for screening mammogram for malignant neoplasm of breast: Secondary | ICD-10-CM

## 2016-03-13 ENCOUNTER — Encounter: Payer: Self-pay | Admitting: Family Medicine

## 2016-03-13 ENCOUNTER — Ambulatory Visit (INDEPENDENT_AMBULATORY_CARE_PROVIDER_SITE_OTHER): Payer: Medicare Other | Admitting: Family Medicine

## 2016-03-13 VITALS — BP 147/78 | HR 77 | Temp 97.6°F | Ht 65.5 in | Wt 222.4 lb

## 2016-03-13 DIAGNOSIS — E782 Mixed hyperlipidemia: Secondary | ICD-10-CM | POA: Diagnosis not present

## 2016-03-13 DIAGNOSIS — I10 Essential (primary) hypertension: Secondary | ICD-10-CM | POA: Diagnosis not present

## 2016-03-13 DIAGNOSIS — Z5181 Encounter for therapeutic drug level monitoring: Secondary | ICD-10-CM | POA: Diagnosis not present

## 2016-03-13 DIAGNOSIS — E890 Postprocedural hypothyroidism: Secondary | ICD-10-CM | POA: Diagnosis not present

## 2016-03-13 DIAGNOSIS — Z0000 Encounter for general adult medical examination without abnormal findings: Secondary | ICD-10-CM

## 2016-03-13 DIAGNOSIS — E119 Type 2 diabetes mellitus without complications: Secondary | ICD-10-CM | POA: Diagnosis not present

## 2016-03-13 DIAGNOSIS — E559 Vitamin D deficiency, unspecified: Secondary | ICD-10-CM

## 2016-03-13 DIAGNOSIS — Z130 Encounter for screening for diseases of the blood and blood-forming organs and certain disorders involving the immune mechanism: Secondary | ICD-10-CM | POA: Diagnosis not present

## 2016-03-13 DIAGNOSIS — Z1211 Encounter for screening for malignant neoplasm of colon: Secondary | ICD-10-CM

## 2016-03-13 DIAGNOSIS — K219 Gastro-esophageal reflux disease without esophagitis: Secondary | ICD-10-CM

## 2016-03-13 DIAGNOSIS — Z6379 Other stressful life events affecting family and household: Secondary | ICD-10-CM

## 2016-03-13 LAB — HEMOGLOBIN A1C: HEMOGLOBIN A1C: 6.6 % — AB (ref 4.6–6.5)

## 2016-03-13 LAB — COMPREHENSIVE METABOLIC PANEL
ALK PHOS: 72 U/L (ref 39–117)
ALT: 20 U/L (ref 0–35)
AST: 22 U/L (ref 0–37)
Albumin: 4.3 g/dL (ref 3.5–5.2)
BILIRUBIN TOTAL: 0.5 mg/dL (ref 0.2–1.2)
BUN: 14 mg/dL (ref 6–23)
CALCIUM: 9.7 mg/dL (ref 8.4–10.5)
CO2: 26 mEq/L (ref 19–32)
Chloride: 102 mEq/L (ref 96–112)
Creatinine, Ser: 0.74 mg/dL (ref 0.40–1.20)
GFR: 98.14 mL/min (ref >60.00)
Glucose, Bld: 110 mg/dL — ABNORMAL HIGH (ref 70–99)
POTASSIUM: 3.8 meq/L (ref 3.5–5.1)
Sodium: 137 mEq/L (ref 135–145)
TOTAL PROTEIN: 7.9 g/dL (ref 6.0–8.3)

## 2016-03-13 LAB — LIPID PANEL
CHOLESTEROL: 159 mg/dL (ref 0–200)
HDL: 47.3 mg/dL (ref >39.00)
LDL Cholesterol: 83 mg/dL (ref 0–99)
NonHDL: 111.63
TRIGLYCERIDES: 145 mg/dL (ref 0.0–149.0)
Total CHOL/HDL Ratio: 3
VLDL: 29 mg/dL (ref 0.0–40.0)

## 2016-03-13 LAB — CBC
HEMATOCRIT: 38.9 % (ref 36.0–46.0)
HEMOGLOBIN: 13.4 g/dL (ref 12.0–15.0)
MCHC: 34.4 g/dL (ref 30.0–36.0)
MCV: 96.6 fl (ref 78.0–100.0)
PLATELETS: 220 10*3/uL (ref 150.0–400.0)
RBC: 4.03 Mil/uL (ref 3.87–5.11)
RDW: 14.3 % (ref 11.5–15.5)
WBC: 4.9 10*3/uL (ref 4.0–10.5)

## 2016-03-13 LAB — VITAMIN D 25 HYDROXY (VIT D DEFICIENCY, FRACTURES): VITD: 23.24 ng/mL — AB (ref 30.00–100.00)

## 2016-03-13 MED ORDER — METFORMIN HCL 500 MG PO TABS: ORAL_TABLET | ORAL | 3 refills | Status: DC

## 2016-03-13 MED ORDER — LEVOTHYROXINE SODIUM 100 MCG PO TABS: 100.0000 ug | ORAL_TABLET | Freq: Every day | ORAL | 3 refills | Status: DC

## 2016-03-13 MED ORDER — FAMOTIDINE 10 MG PO TABS: 10.0000 mg | ORAL_TABLET | Freq: Two times a day (BID) | ORAL | 3 refills | Status: DC

## 2016-03-13 MED ORDER — ATORVASTATIN CALCIUM 20 MG PO TABS
ORAL_TABLET | ORAL | 3 refills | Status: DC
Start: 2016-03-13 — End: 2017-03-21

## 2016-03-13 MED ORDER — VALSARTAN-HYDROCHLOROTHIAZIDE 160-12.5 MG PO TABS: ORAL_TABLET | ORAL | 3 refills | Status: DC

## 2016-03-13 NOTE — Progress Notes (Signed)
[place] at [facility] [address] [place], [place] [address] [phone]  Date:  03/13/2016   Name:  Anne [name]   DOB:  [birth year]   MRN:  [id number]  PCP:  Anne Blinks, MD    Chief Complaint: Annual Exam (Pt here for CPE. Last colonoscopy: 01/04/2006. Pt is fasting for labs. Had flu vaccine 11/10/15. )   History of Present Illness:  Anne [name] is a 76 y.o. very pleasant female patient who presents with the following:  List visit on 11-10-2015 was an acute visit.  HPI from this visit:  Here today to follow-up on her DM- started metformin in January of 2017.  RecentLabs       Lab Results  Component Value Date   HGBA1C 6.6 (H) 07/08/2015     She is also treated for hypertension.   Routine recheck today- she is feeling well.  However she has gotten a jury duty summons, would like an excuse for this. She has a hard time getting around without her husband and does not think she can navigate being on a jury alone She does not have any particular SE from her metformin.   She has checked her BP a few times at home, and also brought in her monitor  Recent home BP readings of 120/44, 132/48, 115/42, 135/60  She feels well, she has not felt dizzy or sick.   Checked her meter against ours today and got a similar number  She needs a flu shot today Eye exam is UTD [place], pneumonia shots are UTD  She has noted a white spot on her forehead for a few weeks or months. It does not hurt but does not want to go away   Plan from 11-10-2015:  Controlled type 2 diabetes mellitus without complication, without long-term current use of insulin ([place]) - Plan: Comprehensive metabolic panel, Hemoglobin A1c  Hypothyroidism, unspecified hypothyroidism type - Plan: TSH  Essential hypertension - Plan: Comprehensive metabolic panel  Screening for deficiency anemia - Plan: CBC  Milia  Medication monitoring encounter -  Plan: CBC  Encounter for immunization - Plan: Flu vaccine HIGH DOSE PF  Labs pending as above Flu shot today SBP is a little high today- however her home readings show a low DBP so will not increase any medications. She has not noted any sx of hypotension Removed milia as above Plan for a CPE in January of next year Did jury duty excuse for her   HPI for today's visit:  Presenting today for CPE.  Patient states that he is under stress, because her grandson Anne [name], 63 years old) has cancer (Meningioma) and has had 2 surgeries so far with more treatment planned. She and her husband have been traveling frequently to see him and their expectant daughter/SIL is in [place].  She is taking naps and talking about the situation to handle her stress.  She thinks about this situation a lot- "I think about it when I wake up and I think about it when I go to bed."  Stayed the whole months of October and December in [place].  Declines any medication to help handle her stress. At this time she feels like she is doing ok but will let me know if she changes her mind  Other than the stress, her body is doing pretty good.  She seems likes she gets more tired now.  She gets at least 8 hours of sleep normally.  Does not have a CBG machine to check her glucose.  Checks her blood pressure roughly every other day, it normally runs around 134/60.  Took some Robutussin when she had a cold about a week ago.  Thinks that she may have twisted her ankle.  No complaints about this today.  Requesting medication refills, not sure which ones though.  Patient is fasting today.  Had a mammogram on Friday, 03-10-2016 (No mammographic evidence of malignancy).  Due for colon screening.  Wants to do Cologuard.  Trying to find time for a colonoscopy is difficult right now  She has not noted any sx of high or low blood sugar.  No CP or SOB, no rash, nausea, vomiting  Patient Active Problem List   Diagnosis Date  Noted  . Controlled type 2 diabetes mellitus without complication, without long-term current use of insulin ([place]) 03/31/2015  . Chest pressure 03/03/2013  . Globus sensation 03/03/2013  . DOE (dyspnea on exertion) 03/03/2013  . Thyroid cancer ([place]) 01/05/2012  . Hyperlipidemia 01/05/2012  . Dysphonia 01/05/2012  . 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 Hypertension 01/04/2012  . Hypothyroid 01/04/2012    Past Medical History:  Diagnosis Date  . Arthritis   . Cataract   . Comedone   . Hypertension   . Thyroid disease    thyroidectomy in 2011    Past Surgical History:  Procedure Laterality Date  . CATARACT EXTRACTION    . EYE SURGERY    . JOINT REPLACEMENT    . THYROIDECTOMY  12/25/2009  . TONSILLECTOMY AND ADENOIDECTOMY    . 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 TOTAL HIP ARTHROPLASTY Right 06/03/2008    Social History  Substance Use Topics  . Smoking status: Never Smoker  . Smokeless tobacco: Never Used  . Alcohol use No    Family History  Problem Relation Age of Onset  . Stroke Mother   . Cancer Father   . Cancer Sister   . Miscarriages / Stillbirths Neg Hx   . Cancer Sister   . Cancer Sister     No Known Allergies  Medication list has been reviewed and updated.  Current Outpatient Prescriptions on File Prior to Visit  Medication Sig Dispense Refill  . acetaminophen (TYLENOL) 500 MG tablet Take 500 mg by mouth every 6 (six) hours as needed for mild pain.    Marland Kitchen aspirin 81 MG tablet Take 81 mg by mouth daily.    Marland Kitchen atorvastatin (LIPITOR) 20 MG tablet TAKE 1 TABLET (20 MG TOTAL) BY MOUTH DAILY. 90 tablet 3  . Cholecalciferol (VITAMIN D-3) 5000 UNITS TABS Take by mouth daily. Reported on 02/24/2015    . famotidine (PEPCID) 10 MG tablet Take 1 tablet (10 mg total) by mouth 2 (two) times daily. 180 tablet 3  . Glycerin-Hypromellose-PEG 400 ([facility] DRY EYE RELIEF OP) Apply 2 drops to eye daily.    Marland Kitchen levothyroxine (SYNTHROID, LEVOTHROID) 100 MCG tablet Take 1  tablet (100 mcg total) by mouth daily. 90 tablet 3  . metFORMIN (GLUCOPHAGE) 500 MG tablet Take 1 daily 90 tablet 3  . Multiple Vitamin (MULTI VITAMIN DAILY PO) Take by mouth daily. Reported on 02/24/2015    . valsartan-hydrochlorothiazide (DIOVAN-HCT) 160-12.5 MG tablet TAKE 1 TABLET BY MOUTH DAILY 90 tablet 3   No current facility-administered medications on file prior to visit.     Review of Systems:  Review of Systems  Constitutional: Negative for chills, diaphoresis, fever, malaise/fatigue and weight loss.  HENT: Negative for congestion, ear  discharge, ear pain, hearing loss, nosebleeds, sinus pain, sore throat and tinnitus.   Eyes: Negative for blurred vision, double vision, photophobia, pain, discharge and redness.  Respiratory: Negative for cough, hemoptysis, sputum production, shortness of breath, wheezing and stridor.        Positive for occasional SOB, that resolves with rest  Cardiovascular: Negative for chest pain, palpitations, orthopnea and leg swelling.  Gastrointestinal: Negative for abdominal pain, blood in stool, constipation, diarrhea, heartburn, melena, nausea and vomiting.  Genitourinary: Negative for dysuria, flank pain, frequency, hematuria and urgency.  Musculoskeletal: Negative for back pain, falls, joint pain, myalgias and neck pain.  Neurological: Negative for dizziness, tingling, tremors, sensory change, speech change, focal weakness, weakness and headaches.  Psychiatric/Behavioral: Negative for depression, hallucinations, memory loss, substance abuse and suicidal ideas. The patient is nervous/anxious.      Physical Examination: Vitals:   03/13/16 1050 03/13/16 1054  BP: (!) 154/84 (!) 147/78  Pulse: 77   Temp: 97.6 F (36.4 C)    Vitals:   03/13/16 1050  Weight: 222 lb 6.4 oz (100.9 kg)  Height: 5' 5.5" (1.664 m)   Body mass index is 36.45 kg/m. Ideal Body Weight: Weight in (lb) to have BMI = 25: 152.2  Physical Examination: General appearance -  alert, well appearing, and in no distress, oriented to person, place, and time and overweight Mental status - alert, oriented to person, place, and time Eyes - pupils equal and reactive, extraocular eye movements intact Ears - bilateral TM's and external ear canals normal Nose - normal and patent, no erythema, discharge or polyps Mouth - mucous membranes moist, pharynx normal without lesions Neck - supple, no significant adenopathy Lymphatics - no palpable lymphadenopathy, no hepatosplenomegaly Chest - clear to auscultation, no wheezes, rales or rhonchi, symmetric air entry, no tachypnea, retractions or cyanosis Heart - normal rate, regular rhythm, normal S1, S2, no murmurs, rubs, clicks or gallops Abdomen - soft, nontender, nondistended, no masses or organomegaly Breasts - breasts appear normal, no suspicious masses, no skin or nipple changes or axillary nodes, not examined Pelvic - exam declined by the patient Rectal - rectal exam not indicated Back exam - full range of motion, no tenderness, palpable spasm or pain on motion Neurological - alert, oriented, normal speech, no focal findings or movement disorder noted Musculoskeletal - no joint tenderness, deformity or swelling Extremities - peripheral pulses normal, no pedal edema, no clubbing or cyanosis Skin - normal coloration and turgor, no rashes, no suspicious skin lesions noted   Assessment and Plan: Physical exam  Controlled type 2 diabetes mellitus without complication, without long-term current use of insulin ([facility]) - Plan: Comprehensive metabolic panel, Hemoglobin A1c, metFORMIN (GLUCOPHAGE) 500 MG tablet  Essential hypertension - Plan: Comprehensive metabolic panel, valsartan-hydrochlorothiazide (DIOVAN-HCT) 160-12.5 MG tablet  Postoperative hypothyroidism - Plan: levothyroxine (SYNTHROID, LEVOTHROID) 100 MCG tablet  Mixed hyperlipidemia - Plan: Lipid panel, atorvastatin (LIPITOR) 20 MG tablet  Stressful life event  affecting family  Screening for deficiency anemia - Plan: CBC  Screening for colon cancer  Medication monitoring encounter - Plan: CBC  Vitamin D deficiency - Plan: Vitamin D (25 hydroxy)  Gastroesophageal reflux disease without esophagitis - Plan: famotidine (PEPCID) 10 MG tablet  Here today for a CPE Her BP is under reasonable control although a bit higher than we would like BP Readings from Last 3 Encounters:  03/13/16 (!) 147/78  11/10/15 (!) 143/77  07/08/15 (!) 160/74   For the time being will continue to monitor  Setup Cologuard  Labs today: A1C, Lipid Panel, CBC, CMP  Patient Instructions:  It was a pleasure to see you today as always.  We are so sorry to hear about your grandson's illness and will be praying for a good outcome for him.  I will be in touch with your labs asap Take care and let's plan to recheck here in about 6 months You should get a cologuard kit in the mail to complete for your colon cancer screening     Signed Anne Blinks, MD

## 2016-03-13 NOTE — Patient Instructions (Signed)
It was a pleasure to see you today as always.  We are so sorry to hear about your grandson's illness and will be praying for a good outcome for him.  I will be in touch with your labs asap Take care and let's plan to recheck here in about 6 months You should get a cologuard kit in the mail to complete for your colon cancer screening

## 2016-03-14 NOTE — Progress Notes (Signed)
[place] at [facility] [address] [place], [place] [address] [phone]  Date:  03/13/2016   Name:  Anne [name]   DOB:  1940-09-30   MRN:  [id number]  PCP:  Lamar Blinks, MD    Chief Complaint: Annual Exam (Pt here for CPE. Last colonoscopy: 01/04/2006. Pt is fasting for labs. Had flu vaccine 11/10/15. )   History of Present Illness:  Anne [name] is a 76 y.o. very pleasant female patient who presents with the following:  Here today for a general check up History of DM, HTN, hyperlipidemia, thyroid cancer s/p thyroidectomy She is overall feeling well herself, but is very concerned about her only grandson, 24 yo Harrell Gave, who has been dx with a meningioma brain tumor.  He lives in [place] with her daughter- who is expecting- and SIL.  They have been up there a lot and spending a lot of time helping out.  She feels that she is not depressed or really anxious, but is understandably feeling very worried and upset.  At this time she does not desire medication for anxiety or stress- she feels that she is handling things ok but will let me know if she needs help Colonoscopy due- discussed cologuard and she would like to have this test instead of a colonoscopy at this time   Notes that her BP is well controlled when she checks it a home QOD Patient Active Problem List   Diagnosis Date Noted  . Controlled type 2 diabetes mellitus without complication, without long-term current use of insulin ([place]) 03/31/2015  . Chest pressure 03/03/2013  . Globus sensation 03/03/2013  . DOE (dyspnea on exertion) 03/03/2013  . Thyroid cancer ([place]) 01/05/2012  . Hyperlipidemia 01/05/2012  . Dysphonia 01/05/2012  . 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 Hypertension 01/04/2012  . Hypothyroid 01/04/2012    Past Medical History:  Diagnosis Date  . Arthritis   . Cataract   . Comedone   . Hypertension   . Thyroid disease    thyroidectomy  in 2011    Past Surgical History:  Procedure Laterality Date  . CATARACT EXTRACTION    . EYE SURGERY    . JOINT REPLACEMENT    . THYROIDECTOMY  12/25/2009  . TONSILLECTOMY AND ADENOIDECTOMY    . 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 TOTAL HIP ARTHROPLASTY Right 06/03/2008    Social History  Substance Use Topics  . Smoking status: Never Smoker  . Smokeless tobacco: Never Used  . Alcohol use No    Family History  Problem Relation Age of Onset  . Stroke Mother   . Cancer Father   . Cancer Sister   . Miscarriages / Stillbirths Neg Hx   . Cancer Sister   . Cancer Sister     No Known Allergies  Medication list has been reviewed and updated.  Current Outpatient Prescriptions on File Prior to Visit  Medication Sig Dispense Refill  . acetaminophen (TYLENOL) 500 MG tablet Take 500 mg by mouth every 6 (six) hours as needed for mild pain.    Marland Kitchen aspirin 81 MG tablet Take 81 mg by mouth daily.    . Cholecalciferol (VITAMIN D-3) 5000 UNITS TABS Take by mouth daily. Reported on 02/24/2015    . Glycerin-Hypromellose-PEG 400 ([facility] DRY EYE RELIEF OP) Apply 2 drops to eye daily.    . Multiple Vitamin (MULTI VITAMIN DAILY PO) Take by mouth daily. Reported on 02/24/2015  No current facility-administered medications on file prior to visit.     Review of Systems:  As per HPI- otherwise negative. No fever, chills, CP, sob, rash, nausea, vomiting or diarrhea  Physical Examination: Vitals:   03/13/16 1050 03/13/16 1054  BP: (!) 154/84 (!) 147/78  Pulse: 77   Temp: 97.6 F (36.4 C)    Vitals:   03/13/16 1050  Weight: 222 lb 6.4 oz (100.9 kg)  Height: 5' 5.5" (1.664 m)   Body mass index is 36.45 kg/m. Ideal Body Weight: Weight in (lb) to have BMI = 25: 152.2  GEN: WDWN, NAD, Non-toxic, A & O x 3, overweight, otherwise looks well, here today with her husband  HEENT: Atraumatic, Normocephalic. Neck supple. No masses, No LAD.  Bilateral TM wnl, oropharynx normal.  PEERL,EOMI.    Ears and Nose: No external deformity. CV: RRR, No M/G/R. No JVD. No thrill. No extra heart sounds. PULM: CTA B, no wheezes, crackles, rhonchi. No retractions. No resp. distress. No accessory muscle use. EXTR: No c/c/e NEURO Normal gait.  PSYCH: Normally interactive. Conversant. Not depressed or anxious appearing.  Calm demeanor.    Assessment and Plan: Physical exam  Controlled type 2 diabetes mellitus without complication, without long-term current use of insulin ([place]) - Plan: Comprehensive metabolic panel, Hemoglobin A1c, metFORMIN (GLUCOPHAGE) 500 MG tablet  Essential hypertension - Plan: Comprehensive metabolic panel, valsartan-hydrochlorothiazide (DIOVAN-HCT) 160-12.5 MG tablet  Postoperative hypothyroidism - Plan: levothyroxine (SYNTHROID, LEVOTHROID) 100 MCG tablet  Mixed hyperlipidemia - Plan: Lipid panel, atorvastatin (LIPITOR) 20 MG tablet  Stressful life event affecting family  Screening for deficiency anemia - Plan: CBC  Screening for colon cancer  Medication monitoring encounter - Plan: CBC  Vitamin D deficiency - Plan: Vitamin D (25 hydroxy)  Gastroesophageal reflux disease without esophagitis - Plan: famotidine (PEPCID) 10 MG tablet  Here today for a recheck visit- will not bill as a PE as this is not allowed by her insurance plan Labs pending as above Offered support regarding her recent family illness and let he know that we are here to help if she needs medication for her symptoms Check TSH cologuard paperwork completed Immunizations are UTD  Signed Lamar Blinks, MD

## 2016-03-14 NOTE — Addendum Note (Signed)
Addended by: Lamar Blinks C on: 03/14/2016 08:10 PM   Modules accepted: Level of Service

## 2016-04-04 DIAGNOSIS — Z1211 Encounter for screening for malignant neoplasm of colon: Secondary | ICD-10-CM | POA: Diagnosis not present

## 2016-04-04 DIAGNOSIS — Z1212 Encounter for screening for malignant neoplasm of rectum: Secondary | ICD-10-CM | POA: Diagnosis not present

## 2016-04-04 LAB — COLOGUARD: Cologuard: NEGATIVE

## 2016-04-26 ENCOUNTER — Ambulatory Visit (INDEPENDENT_AMBULATORY_CARE_PROVIDER_SITE_OTHER): Payer: Medicare Other | Admitting: Family Medicine

## 2016-04-26 VITALS — BP 147/68 | HR 93 | Temp 98.1°F | Ht 65.5 in | Wt 223.6 lb

## 2016-04-26 DIAGNOSIS — B029 Zoster without complications: Secondary | ICD-10-CM | POA: Diagnosis not present

## 2016-04-26 MED ORDER — VALACYCLOVIR HCL 1 G PO TABS: 1000.0000 mg | ORAL_TABLET | Freq: Three times a day (TID) | ORAL | 0 refills | Status: DC

## 2016-04-26 NOTE — Progress Notes (Signed)
Pre visit review using our clinic tool,if applicable. No additional management support is needed unless otherwise documented below in the visit note.  

## 2016-04-26 NOTE — Progress Notes (Signed)
[place] at [facility] [address], [place], [place] [address] [phone]  Date:  04/26/2016   Name:  Anne [name]   DOB:  1940/10/31   MRN:  [id number]  PCP:  Lamar Blinks, MD    Chief Complaint: Back Pain   History of Present Illness:  Anne [name] is a 76 y.o. very pleasant female patient who presents with the following:  Here today with back pain- last visit in January of this year, partial HPI as below:  Here today for a general check up History of DM, HTN, hyperlipidemia, thyroid cancer s/p thyroidectomy She is overall feeling well herself, but is very concerned about her only grandson, 13 yo Harrell Gave, who has been dx with a meningioma brain tumor.  He lives in [place] with her daughter- who is expecting- and SIL.  They have been up there a lot and spending a lot of time helping out.  She feels that she is not depressed or really anxious, but is understandably feeling very worried and upset.  At this time she does not desire medication for anxiety or stress- she feels that she is handling things ok but will let me know if she needs help  She has noted a pain in her right flank/ right mid back this past Friday. Today is Wednesday.   She first noted a strange, tender area in the middle of her back.  Since then it has started to wrap around her right side and if has gotten worse.  Her clothing on the area is bothersome and feels bad when it touches her skin She has not had any fever, chills, or other sign of systemic illness such as fever  She did have the shingles vaccine in the past  Her daughter will deliver next week and they were planning to travel to [place] to help  No urinary sx They have tried putting some ben-gay on the area   BP Readings from Last 3 Encounters:  04/26/16 (!) 147/68  03/13/16 (!) 147/78  11/10/15 (!) 143/77     Patient Active Problem List   Diagnosis Date Noted  . Controlled type 2 diabetes  mellitus without complication, without long-term current use of insulin ([place]) 03/31/2015  . Chest pressure 03/03/2013  . Globus sensation 03/03/2013  . DOE (dyspnea on exertion) 03/03/2013  . Thyroid cancer ([place]) 01/05/2012  . Hyperlipidemia 01/05/2012  . Dysphonia 01/05/2012  . 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 Hypertension 01/04/2012  . Hypothyroid 01/04/2012    Past Medical History:  Diagnosis Date  . Arthritis   . Cataract   . Comedone   . Hypertension   . Thyroid disease    thyroidectomy in 2011    Past Surgical History:  Procedure Laterality Date  . CATARACT EXTRACTION    . EYE SURGERY    . JOINT REPLACEMENT    . THYROIDECTOMY  12/25/2009  . TONSILLECTOMY AND ADENOIDECTOMY    . 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 TOTAL HIP ARTHROPLASTY Right 06/03/2008    Social History  Substance Use Topics  . Smoking status: Never Smoker  . Smokeless tobacco: Never Used  . Alcohol use No    Family History  Problem Relation Age of Onset  . Stroke Mother   . Cancer Father   . Cancer Sister   . Miscarriages / Stillbirths Neg Hx   . Cancer Sister   . Cancer Sister     No Known Allergies  Medication list has been reviewed and updated.  Current Outpatient Prescriptions on File Prior to Visit  Medication Sig Dispense Refill  . acetaminophen (TYLENOL) 500 MG tablet Take 500 mg by mouth every 6 (six) hours as needed for mild pain.    Marland Kitchen aspirin 81 MG tablet Take 81 mg by mouth daily.    Marland Kitchen atorvastatin (LIPITOR) 20 MG tablet TAKE 1 TABLET (20 MG TOTAL) BY MOUTH DAILY. 90 tablet 3  . Calcium-Phosphorus-Vitamin D (CITRACAL +D3 PO) Take 2 tablets by mouth 2 (two) times daily.    . Cholecalciferol (VITAMIN D-3) 5000 UNITS TABS Take by mouth daily. Reported on 02/24/2015    . famotidine (PEPCID) 10 MG tablet Take 1 tablet (10 mg total) by mouth 2 (two) times daily. 180 tablet 3  . Glycerin-Hypromellose-PEG 400 ([facility] DRY EYE RELIEF OP) Apply 2 drops to eye daily.     Marland Kitchen guaiFENesin (ROBITUSSIN) 100 MG/5ML liquid Take 200 mg by mouth daily.    Marland Kitchen levothyroxine (SYNTHROID, LEVOTHROID) 100 MCG tablet Take 1 tablet (100 mcg total) by mouth daily. 90 tablet 3  . metFORMIN (GLUCOPHAGE) 500 MG tablet Take 1 daily 90 tablet 3  . Multiple Vitamin (MULTI VITAMIN DAILY PO) Take by mouth daily. Reported on 02/24/2015    . valsartan-hydrochlorothiazide (DIOVAN-HCT) 160-12.5 MG tablet TAKE 1 TABLET BY MOUTH DAILY 90 tablet 3   No current facility-administered medications on file prior to visit.     Review of Systems:  As per HPI- otherwise negative. No CP, no SOB   Physical Examination: Vitals:   04/26/16 1518  BP: (!) 147/68  Pulse: 93  Temp: 98.1 F (36.7 C)   Vitals:   04/26/16 1518  Weight: 223 lb 9.6 oz (101.4 kg)  Height: 5' 5.5" (1.664 m)   Body mass index is 36.64 kg/m. Ideal Body Weight: Weight in (lb) to have BMI = 25: 152.2  GEN: WDWN, NAD, Non-toxic, A & O x 3, obese, looks well HEENT: Atraumatic, Normocephalic. Neck supple. No masses, No LAD. Ears and Nose: No external deformity. CV: RRR, No M/G/R. No JVD. No thrill. No extra heart sounds. PULM: CTA B, no wheezes, crackles, rhonchi. No retractions. No resp. distress. No accessory muscle use. EXTR: No c/c/e NEURO Normal gait.  PSYCH: Normally interactive. Conversant. Not depressed or anxious appearing.  Calm demeanor.  She has a mild shingles rash in the approx T8 distribution on her right side.  This area is tender to the touch and feels like it is burning to the pt   Assessment and Plan: Herpes zoster without complication - Plan: valACYclovir (VALTREX) 1000 MG tablet  Here today with shingles Her outbreak is fairly mild at this time Treat with valtrex. Advised her to avoid vulnerable persons for the next 10 days She will let me know if not getting better in the next couple of days- Sooner if worse.     Signed Lamar Blinks, MD

## 2016-04-26 NOTE — Patient Instructions (Signed)
It looks like you have shingles- this is a viral infection caused by the chicken pox virus.   Let me know if this is not getting better in the next couple of days  Use the valtrex as directed  I would recommend that you stay clear of your pregnant daughter, infant grandchild for about 10 days

## 2016-07-11 ENCOUNTER — Emergency Department: Payer: Medicare Other

## 2016-07-11 ENCOUNTER — Emergency Department
Admission: EM | Admit: 2016-07-11 | Discharge: 2016-07-12 | Disposition: A | Discharge status: Home / Self Care | Payer: Medicare Other | Attending: Emergency Medicine | Admitting: Emergency Medicine

## 2016-07-11 DIAGNOSIS — R1110 Vomiting, unspecified: Secondary | ICD-10-CM | POA: Insufficient documentation

## 2016-07-11 DIAGNOSIS — I10 Essential (primary) hypertension: Secondary | ICD-10-CM

## 2016-07-11 DIAGNOSIS — R42 Dizziness and giddiness: Secondary | ICD-10-CM | POA: Insufficient documentation

## 2016-07-11 DIAGNOSIS — R112 Nausea with vomiting, unspecified: Secondary | ICD-10-CM | POA: Diagnosis not present

## 2016-07-11 DIAGNOSIS — R51 Headache: Secondary | ICD-10-CM | POA: Diagnosis not present

## 2016-07-11 DIAGNOSIS — Z743 Need for continuous supervision: Secondary | ICD-10-CM | POA: Diagnosis not present

## 2016-07-11 MED ORDER — MECLIZINE HCL 12.5 MG PO TABS
25.0000 mg | ORAL_TABLET | Freq: Once | ORAL | Status: AC
Start: 2016-07-11 — End: 2016-07-12
  Administered 2016-07-12: 25 mg via ORAL
  Filled 2016-07-11: qty 2

## 2016-07-11 MED ORDER — SODIUM CHLORIDE 0.9 % IV BOLUS
1000.0000 mL | Freq: Once | INTRAVENOUS | Status: AC
Start: 2016-07-11 — End: 2016-07-12
  Administered 2016-07-12: 01:00:00 1000 mL via INTRAVENOUS

## 2016-07-11 MED ORDER — HYDRALAZINE HCL 20 MG/ML IJ SOLN
10.0000 mg | Freq: Once | INTRAMUSCULAR | Status: AC
Start: 2016-07-11 — End: 2016-07-12
  Administered 2016-07-12: 01:00:00 10 mg via INTRAVENOUS
  Filled 2016-07-11: qty 1

## 2016-07-11 MED ORDER — ONDANSETRON HCL 4 MG/2ML IJ SOLN
4.0000 mg | Freq: Once | INTRAMUSCULAR | Status: AC
Start: 2016-07-11 — End: 2016-07-12
  Administered 2016-07-12: 01:00:00 4 mg via INTRAVENOUS
  Filled 2016-07-11: qty 2

## 2016-07-11 NOTE — ED Provider Notes (Signed)
[name] EMERGENCY DEPARTMENT H&P         CLINICAL SUMMARY          Diagnosis:    .     Final diagnoses:   Hypertension, unspecified type   Dizziness   Vomiting, intractability of vomiting not specified, presence of nausea not specified, unspecified vomiting type         MDM Notes:      76 yo with a h/o HTN and pre DM p/w vertigo while laying flat. CT head was negative.       Disposition:         Discharge         Discharge Prescriptions     Medication Sig Dispense Auth. Provider    meclizine (ANTIVERT) 25 MG tablet Take 1 tablet (25 mg total) by mouth 3 (three) times daily as needed for Dizziness.for up to 10 doses 15 tablet Shara Blazing, MD    ondansetron (ZOFRAN-ODT) 4 MG disintegrating tablet Take 1 tablet (4 mg total) by mouth every 6 (six) hours as needed for Nausea. 8 tablet Shara Blazing, MD                    CLINICAL INFORMATION        HPI:      Chief Complaint: Dizziness  .    Latasha Wolfe is a 76 y.o. female BIBA from urgent care with a PMHx including HTN, HLD, hyperthyroidism who p/w spinning dizziness and intermittent L sided HA as well as n/v. Has been having n/v/d x5 days. Felt better yesterday, however felt worse again today. Pt is visiting her daughter in the area. Pt is from [place].     Sts she took her HTN med today.  Denies abd pain.  No major surgeries.  No known h/o stroke  No sick contacts at home.   No new foods.     History obtained from: patient, family      ROS:      Positive and negative ROS elements as per HPI.  All other systems reviewed and negative.      Physical Exam:      Pulse 77  BP 198/86  Resp 17  SpO2 99 %  Temp 97.8 F (36.6 C)    Physical Exam   Constitutional: She is oriented to person, place, and time. Vital signs are normal.  Non-toxic appearance. She does not have a sickly appearance. She does not appear ill. No distress.   hypertensive   HENT:   Head: Normocephalic and atraumatic.   Mouth/Throat: Oropharynx is  clear and moist and mucous membranes are normal.   Eyes: Conjunctivae and EOM are normal. Pupils are equal, round, and reactive to light.   No nystagmus   Neck: Trachea normal and normal range of motion. Neck supple.   Cardiovascular: Normal rate, regular rhythm, normal heart sounds and normal pulses.    Pulmonary/Chest: Effort normal and breath sounds normal. No respiratory distress. She exhibits no tenderness.   Abdominal: Soft. Bowel sounds are normal. There is no tenderness. There is no rebound and no CVA tenderness.   Musculoskeletal: Normal range of motion.   Neurological: She is alert and oriented to person, place, and time. GCS eye subscore is 4. GCS verbal subscore is 5. GCS motor subscore is 6.   Skin: Skin is warm, dry and intact.   Psychiatric: She has a normal mood and affect. Her speech is normal. Judgment normal.  PAST HISTORY        Primary Care Provider: Christa See, MD        PMH/PSH:    .     No past medical history on file.    She has a past surgical history that includes Thyroid surgery and Total hip arthroplasty.      Social/Family History:      She reports that she has never smoked. She has never used smokeless tobacco. She reports that she does not drink alcohol or use drugs.    No family history on file.      Listed Medications on Arrival:    .     Previous Medications    No medications on file      Allergies: She has No Known Allergies.            VISIT INFORMATION        Clinical Course in the ED:             Medications Given in the ED:    .     ED Medication Orders     Start Ordered     Status Ordering Provider    07/11/16 2324 07/11/16 2323  sodium chloride 0.9 % bolus 1,000 mL  Once     Route: Intravenous  Ordered Dose: 1,000 mL     Last MAR action:  New Bag Bobbye Reinitz D    07/11/16 2324 07/11/16 2323  ondansetron (ZOFRAN) injection 4 mg  Once     Route: Intravenous  Ordered Dose: 4 mg     Last MAR action:  Given Loralyn Rachel D    07/11/16 2324 07/11/16 2323   hydrALAZINE (APRESOLINE) injection 10 mg  Once     Route: Intravenous  Ordered Dose: 10 mg     Last MAR action:  Given Georgina Peer D    07/11/16 2324 07/11/16 2323  meclizine (ANTIVERT) tablet 25 mg  Once     Route: Oral  Ordered Dose: 25 mg     Last MAR action:  Given Denni France D            Procedures:      Procedures      Interpretations:        Differential Diagnosis (not completely inclusive): viral syndrome, dehydration, TIA, vertigo            RESULTS        Lab Results:      Results     Procedure Component Value Units Date/Time    Comprehensive metabolic panel [[id number]]  (Abnormal) Collected:  07/12/16 0011    Specimen:  Blood Updated:  07/12/16 0037     Glucose 118 (H) mg/dL      BUN 40.9 mg/dL      Creatinine 0.8 mg/dL      Sodium 811 mEq/L      Potassium 4.4 mEq/L      Chloride 102 mEq/L      CO2 27 mEq/L      Calcium 9.9 mg/dL      Protein, Total 8.1 g/dL      Albumin 4.3 g/dL      AST (SGOT) 29 U/L      ALT 19 U/L      Alkaline Phosphatase 89 U/L      Bilirubin, Total 0.8 mg/dL      Globulin 3.8 (H) g/dL      Albumin/Globulin Ratio 1.1    GFR [[id number]] Collected:  07/12/16 0011  Updated:  07/12/16 0037     EGFR >60.0              Radiology Results:      CT Head WO Contrast   Final Result      1. No acute intracranial hemorrhage is detected.    2. 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          Theodoro Doing, MD    07/11/2016 10:51 PM                  Scribe Attestation:      I was acting as a Neurosurgeon for Shara Blazing, MD on [place]  Treatment Team: Scribe: Gabriel Rainwater     I am the first provider for this patient and I personally performed the services documented. Treatment Team: Scribe: Gabriel Rainwater is scribing for me on Ager,Christee. This note and the patient instructions accurately reflect work and decisions made by me.  Shara Blazing, MD           Shara Blazing, MD  07/14/16 [phone]

## 2016-07-11 NOTE — ED Notes (Signed)
Dr. Talbert at bedside.

## 2016-07-12 LAB — COMPREHENSIVE METABOLIC PANEL
ALT: 19 U/L (ref 0–55)
AST (SGOT): 29 U/L (ref 5–34)
Albumin/Globulin Ratio: 1.1 (ref 0.9–2.2)
Albumin: 4.3 g/dL (ref 3.5–5.0)
Alkaline Phosphatase: 89 U/L (ref 37–106)
BUN: 10 mg/dL (ref 7.0–19.0)
Bilirubin, Total: 0.8 mg/dL (ref 0.2–1.2)
CO2: 27 mEq/L (ref 22–29)
Calcium: 9.9 mg/dL (ref 7.9–10.2)
Chloride: 102 mEq/L (ref 100–111)
Creatinine: 0.8 mg/dL (ref 0.6–1.0)
Globulin: 3.8 g/dL — ABNORMAL HIGH (ref 2.0–3.6)
Glucose: 118 mg/dL — ABNORMAL HIGH (ref 70–100)
Potassium: 4.4 mEq/L (ref 3.5–5.1)
Protein, Total: 8.1 g/dL (ref 6.0–8.3)
Sodium: 139 mEq/L (ref 136–145)

## 2016-07-12 LAB — GFR
EGFR: >60
EGFR: >60

## 2016-07-12 MED ORDER — ONDANSETRON 4 MG PO TBDP
4.0000 mg | ORAL_TABLET | Freq: Four times a day (QID) | ORAL | 0 refills | Status: AC | PRN
Start: 2016-07-12 — End: ?

## 2016-07-12 MED ORDER — MECLIZINE HCL 25 MG PO TABS
25.0000 mg | ORAL_TABLET | Freq: Three times a day (TID) | ORAL | 0 refills | Status: AC | PRN
Start: 2016-07-12 — End: ?

## 2016-07-12 NOTE — Discharge Instructions (Signed)
Dear Latasha Wolfe:    Thank you for choosing the [facility], the premier emergency department in the [place] area.  I hope your visit today was EXCELLENT.    Specific instructions for your visit today:      Dizziness, Nonspecific    You have been seen for dizziness.     Dizziness can mean different things to different people. Some people use dizziness to mean the feeling of spinning when there is no actual movement. This often causes nausea (feeling sick). The medical term for this is "vertigo." Others people use the word dizzy to mean "feeling lightheaded," like you might faint. This feeling is usually made better when lying down. For some people, neither of these describes how they are feeling. It can just be a feeling that makes you unsteady. This feeling is common in older people. It can be caused by a number of things. These include poor vision or hearing, foot problems and arthritis. It can also be caused by middle ear or sinus problems. The feeling can come and go.    Dizziness is also caused by more serious things. This includes strokes and heart problems.    It is NEVER normal to have the kind of dizziness you have today together with:   Chest pain.   Problems walking because of problems with balance. Especially if you are falling to one side.   Weakness, numbness or tingling in a part of your body.   Drooping of one side of your face.   Confusion.   Severe headache.   Problems speaking.    If you have these symptoms, it is VERY IMPORTANT to go to the nearest emergency department.    Your tests today were negative (normal). This means we found no life-threatening causes for your dizziness. It is OK for you to go home.    See your primary care doctor for more work-up of your dizziness.     YOU SHOULD SEEK MEDICAL ATTENTION IMMEDIATELY, EITHER HERE OR AT THE NEAREST EMERGENCY DEPARTMENT, IF ANY OF THE FOLLOWING OCCUR:   You cannot speak clearly (slurring), one side of  your face droops or you feel weak in the arms or legs (especially on one side).   You have problems with your balance.   You have problems hearing or there is ringing or a feeling of fullness in your ear.   You lose consciousness ("pass out" or faint).   You have severe headache with dizziness.   You have fever (temperature higher than 100.28F / 38C).   You fall and hit your head.                 Hypertension    You have been diagnosed with elevated blood pressure.    The medical term for high blood pressure is hypertension. Many people feel anxious or uncomfortable about being at the hospital. If you feel anxious today, this could make your blood pressure appear high, even if your blood pressure is usually normal. Check your blood pressure several more times when you are not feeling stress. Keep a record of these readings and give this information to your regular doctor. He or she will decide whether you have hypertension that requires medical treatment.    If your blood pressure becomes extremely high all of a sudden, you will probably notice symptoms. In fact, very high blood pressure is a medical emergency. Most people with hypertension have blood pressure that is only a little too  high. Mild high blood pressure does not cause specific symptoms. Instead, the effects of hypertension develop slowly over time. Untreated hypertension can affect the heart, brain, kidneys, eyes, and blood vessels. Unfortunately, by the time side-effects become noticeable, the body has already been damaged. This is why hypertension is called "the silent killer!"    It is important to follow up with your regular doctor. Check your blood pressure several times in the next 1 to 2 weeks and tell your doctor about the results. It may be helpful to keep a log or a journal where you can write down your blood pressures. Note the time of day and the activity you were doing when the reading was taken.    YOU SHOULD SEEK MEDICAL  ATTENTION IMMEDIATELY, EITHER HERE OR AT THE NEAREST EMERGENCY DEPARTMENT, IF ANY OF THE FOLLOWING OCCURS:   You have a headache.   You have chest pain.   You are short of breath or have trouble breathing.    You feel weak, especially on only one side of the body.   Your symptoms get worse or you have other concerns.        Vomiting    You have been seen for vomiting.    Vomiting (throwing-up) can be caused by many different things. Most of the time the cause IS NOT serious. The doctor feels it is OK for you to go home today.    Common causes of vomiting include the following:   Gastroenteritis (stomach flu), usually with diarrhea.   Other illnesses. Sometimes medical conditions like diabetes, heart problems, headaches, or infections can make someone throw up.    Bowel obstructions (blockages) can cause vomiting and make patients unable to have bowel movements (stool) or pass gas.   Vomiting can be a symptom of appendicitis, especially if there is also pain in the right lower abdomen (belly).    Sometimes it is hard to find out what is causing the vomiting. Vomiting can be treated with anti-nausea medicines like promethazine (Phenergan), prochlorperazine (Compazine) or ondansetron (Zofran).    Try to drink liquids to avoid dehydration. Don't drink a lot of fluid all at once. Take small sips throughout the day.    YOU SHOULD SEEK MEDICAL ATTENTION IMMEDIATELY, EITHER HERE OR AT THE NEAREST EMERGENCY DEPARTMENT, IF ANY OF THE FOLLOWING OCCURS:   You can't stop vomiting or your vomiting doesn't get better with medication.   You cannot keep liquids down.   You have severe sudden chest or belly pain after vomiting.   You have abdominal pain.                      If you do not continue to improve or your condition worsens, please contact your doctor or return immediately to the Emergency Department.    Sincerely,  Shara Blazing, MD  Attending Emergency Physician  [facility] Emergency  Department    ONSITE PHARMACY  Our full service onsite pharmacy is located in the ER waiting room.  Open 7 days a week from 9 am to 11 pm.  We accept all major insurances and prices are competitive with major retailers.  Ask your provider to print your prescriptions down to the pharmacy to speed you on your way home.    OBTAINING A PRIMARY CARE APPOINTMENT    Primary care physicians (PCPs, also known as primary care doctors) are either internists or family medicine doctors. Both types of PCPs focus on health promotion,  disease prevention, patient education and counseling, and treatment of acute and chronic medical conditions.    Call for an appointment with a primary care doctor.  Ask to see who is taking new patients.     [facility]  telephone:  [phone]  [email or web address]    DOCTOR REFERRALS  Call [phone] (available 24 hours a day, 7 days a week) if you need any further referrals and we can help you find a primary care doctor or specialist.  Also, available online at:  [email or web address]    YOUR CONTACT INFORMATION  Before leaving please check with registration to make sure we have an up-to-date contact number.  You can call registration at [phone] to update your information.  For questions about your hospital bill, please call [phone].  For questions about your Emergency Dept Physician bill please call [phone].      FREE HEALTH SERVICES  If you need help with health or social services, please call 2-1-1 for a free referral to resources in your area.  2-1-1 is a free service connecting people with information on health insurance, free clinics, pregnancy, mental health, dental care, food assistance, housing, and substance abuse counseling.  Also, available online at:  http://www.[facility].org    MEDICAL RECORDS AND TESTS  Certain laboratory test results do not come back the same day, for example urine cultures.   We will contact you if other important  findings are noted.  Radiology films are often reviewed again to ensure accuracy.  If there is any discrepancy, we will notify you.      Please call [phone] to pick up a complimentary CD of any radiology studies performed.  If you or your doctor would like to request a copy of your medical records, please call [phone].      ORTHOPEDIC INJURY   Please know that significant injuries can exist even when an initial x-ray is read as normal or negative.  This can occur because some fractures (broken bones) are not initially visible on x-rays.  For this reason, close outpatient follow-up with your primary care doctor or bone specialist (orthopedist) is required.    MEDICATIONS AND FOLLOWUP  Please be aware that some prescription medications can cause drowsiness.  Use caution when driving or operating machinery.    The examination and treatment you have received in our Emergency Department is provided on an emergency basis, and is not intended to be a substitute for your primary care physician.  It is important that your doctor checks you again and that you report any new or remaining problems at that time.      24 HOUR PHARMACIES  The nearest 24 hour pharmacy is:    [facility]  [address]  [place], [place] [address]  [phone]      ASSISTANCE WITH INSURANCE    Affordable Care Act  [facility])  Call to start or finish an application, compare plans, enroll or ask a question.  [phone]  TTY: [phone]  Web:  Healthcare.gov    Help Enrolling in [facility]  [employer] [place]  (804)540-6423 (TOLL-FREE)  [phone] (TTY)  Web:  Http://www.coverva.org    Local Help Enrolling in the [facility]  [place] [facility]  231-130-5914 (MAIN)  Email:  health-help@nvfs .org  Web:  [employer]jackMyths.is  Address:  [address] [place], [place] [address]    SEDATING MEDICATIONS  Sedating medications include strong pain medications (e.g. narcotics), muscle relaxers, benzodiazepines  (used for anxiety and as  muscle relaxers), Benadryl/diphenhydramine and other antihistamines for allergic reactions/itching, and other medications.  If you are unsure if you have received a sedating medication, please ask your physician or nurse.  If you received a sedating medication: DO NOT drive a car. DO NOT operate machinery. DO NOT perform jobs where you need to be alert.  DO NOT drink alcoholic beverages while taking this medicine.     If you get dizzy, sit or lie down at the first signs. Be careful going up and down stairs.  Be extra careful to prevent falls.     Never give this medicine to others.     Keep this medicine out of reach of children.     Do not take or save old medicines. Throw them away when outdated.     Keep all medicines in a cool, dry place. DO NOT keep them in your bathroom medicine cabinet or in a cabinet above the stove.    MEDICATION REFILLS  Please be aware that we cannot refill any prescriptions through the ER. If you need further treatment from what is provided at your ER visit, please follow up with your primary care doctor or your pain management specialist.    FREESTANDING EMERGENCY DEPARTMENTS OF [facility]  Did you know [facility] has two freestanding ERs located just a few miles away?  [facility] [place] and [facility] [place] have short wait times, easy free parking directly in front of the building and top patient satisfaction scores - and the same Board Certified Emergency Medicine doctors as [facility].

## 2016-07-17 ENCOUNTER — Telehealth: Payer: Self-pay | Admitting: Family Medicine

## 2016-07-17 NOTE — Telephone Encounter (Signed)
Attempted to call patient to schedule awv. Patient did not answer. Will try to call pt again at a later time.

## 2016-07-25 ENCOUNTER — Encounter: Payer: Self-pay | Admitting: Family Medicine

## 2016-09-06 ENCOUNTER — Ambulatory Visit: Payer: Medicare Other | Admitting: Family Medicine

## 2016-09-11 ENCOUNTER — Telehealth: Payer: Self-pay | Admitting: Family Medicine

## 2016-09-11 NOTE — Telephone Encounter (Signed)
Called pt to schedule AWV. Pt stated that she would like to give office a call back to schedule her appt for Sept 2018.

## 2016-09-16 ENCOUNTER — Telehealth: Payer: Self-pay | Admitting: Family Medicine

## 2016-09-16 MED ORDER — LOSARTAN POTASSIUM-HCTZ 50-12.5 MG PO TABS: 1.0000 | ORAL_TABLET | Freq: Every day | ORAL | 3 refills | Status: DC

## 2016-09-16 NOTE — Telephone Encounter (Signed)
Called pt and discussed with her- we are changing her valsartan hct to losartan hct due to a recall  Meds ordered this encounter  Medications  . losartan-hydrochlorothiazide (HYZAAR) 50-12.5 MG tablet    Sig: Take 1 tablet by mouth daily.    Dispense:  90 tablet    Refill:  3

## 2016-09-18 NOTE — Progress Notes (Addendum)
Subjective:   Anne [name] is a 76 y.o. female who presents for Medicare Annual (Subsequent) preventive examination. Here with husband.  Review of Systems:  No ROS.  Medicare Wellness Visit. Additional risk factors are reflected in the social history.  Cardiac Risk Factors include: advanced age (>93men, >48 women);diabetes mellitus;dyslipidemia;hypertension;obesity (BMI >30kg/m2);sedentary lifestyle Sleep patterns:  Naps everyday Sleeps 6-8 hrs per night. Home Safety/Smoke Alarms: Feels safe in home. Smoke alarms in place.  Living environment; residence and Firearm Safety: Lives on one story with husband. Has basement, but doesn't use it. Seat Belt Safety/Bike Helmet: Wears seat belt.   Counseling:   Eye Exam- wearing glasses. Pt states for reading. Dr. Satira Sark yearly.  Dental- Dr.Johnson every 6 months.  Female:   Pap-  No longer doing routine screening due to age.     Mammo- Last 03/10/16: BI-RADS CATEGORY  1: Negative.      Dexa scan-  Last 04/24/14: normal. Pt declines at this time. CCS- Pt states she did Cologuard. Awaiting results.    Objective:     Vitals: BP 128/66 (BP Location: Right Arm, Patient Position: Sitting, Cuff Size: Large)   Pulse 70   Ht 5\' 6"  (1.676 m)   Wt 226 lb (102.5 kg)   SpO2 98%   BMI 36.48 kg/m   Body mass index is 36.48 kg/m.   Tobacco History  Smoking Status  . Never Smoker  Smokeless Tobacco  . Never Used     Counseling given: Not Answered   Past Medical History:  Diagnosis Date  . Arthritis   . Cataract   . [place]   . Hypertension   . Thyroid disease    thyroidectomy in 2011   Past Surgical History:  Procedure Laterality Date  . CATARACT EXTRACTION    . EYE SURGERY    . JOINT REPLACEMENT    . THYROIDECTOMY  12/25/2009  . TONSILLECTOMY AND ADENOIDECTOMY    . 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 TOTAL HIP ARTHROPLASTY Right 06/03/2008   Family History  Problem Relation Age of Onset  . Stroke Mother   . Cancer  Father   . Cancer Sister   . Cancer Sister   . Cancer Sister   . Miscarriages / Stillbirths Neg Hx    History  Sexual Activity  . Sexual activity: Yes    Outpatient Encounter Prescriptions as of 09/20/2016  Medication Sig  . acetaminophen (TYLENOL) 500 MG tablet Take 500 mg by mouth every 6 (six) hours as needed for mild pain.  Marland Kitchen aspirin 81 MG tablet Take 81 mg by mouth daily.  Marland Kitchen atorvastatin (LIPITOR) 20 MG tablet TAKE 1 TABLET (20 MG TOTAL) BY MOUTH DAILY.  . Calcium-Phosphorus-Vitamin D (CITRACAL +D3 PO) Take 2 tablets by mouth 2 (two) times daily.  . Cholecalciferol (VITAMIN D-3) 5000 UNITS TABS Take by mouth daily. Reported on 02/24/2015  . famotidine (PEPCID) 10 MG tablet Take 1 tablet (10 mg total) by mouth 2 (two) times daily.  . Glycerin-Hypromellose-PEG 400 ([facility] DRY EYE RELIEF OP) Apply 2 drops to eye daily.  Marland Kitchen levothyroxine (SYNTHROID, LEVOTHROID) 100 MCG tablet Take 1 tablet (100 mcg total) by mouth daily.  Marland Kitchen losartan-hydrochlorothiazide (HYZAAR) 50-12.5 MG tablet Take 1 tablet by mouth daily.  . metFORMIN (GLUCOPHAGE) 500 MG tablet Take 1 daily  . Multiple Vitamin (MULTI VITAMIN DAILY PO) Take by mouth daily. Reported on 02/24/2015  . guaiFENesin (ROBITUSSIN) 100 MG/5ML liquid Take 200 mg by mouth as needed.   . [DISCONTINUED] valACYclovir (  VALTREX) 1000 MG tablet Take 1 tablet (1,000 mg total) by mouth 3 (three) times daily.   No facility-administered encounter medications on file as of 09/20/2016.     Activities of Daily Living In your present state of health, do you have any difficulty performing the following activities: 09/20/2016  Hearing? N  Vision? N  Difficulty concentrating or making decisions? N  Walking or climbing stairs? N  Dressing or bathing? N  Doing errands, shopping? N  Preparing Food and eating ? N  Using the Toilet? N  In the past six months, have you accidently leaked urine? N  Do you have problems with loss of bowel control? N  Managing your  Medications? N  Managing your Finances? N  Housekeeping or managing your Housekeeping? N  Some recent data might be hidden    Patient Care Team: [name], Gay Filler, MD as PCP - General (Family Medicine)    Assessment:    Physical assessment deferred to PCP.  Exercise Activities and Dietary recommendations Pt states she does not exercise due to her hip surgeries. Chair exercise education provided.   Diet (meal preparation, eat out, water intake, caffeinated beverages, dairy products, fruits and vegetables): well balanced Breakfast: oatmeal and juice Lunch: salads and wraps Dinner: late lunch or soup  Goals      Patient Stated   . Weight (lb) < 205 lb (93 kg) (pt-stated)          Eat healthy and exercise at least 10 min every day.      Fall Risk Fall Risk  09/20/2016 11/10/2015 07/08/2015 03/10/2015 02/24/2015  Falls in the past year? No No No No No   Depression Screen PHQ 2/9 Scores 09/20/2016 11/10/2015 07/08/2015 03/10/2015  PHQ - 2 Score 0 0 0 0     Cognitive Function Ad8 score reviewed for issues:  Issues making decisions:no  Less interest in hobbies / activities:no  Repeats questions, stories (family complaining):no  Trouble using ordinary gadgets (microwave, computer, phone):no  Forgets the month or year: no  Mismanaging finances: no  Remembering appts:no  Daily problems with thinking and/or memory:no Ad8 score is=0        Immunization History  Administered Date(s) Administered  . Influenza Split 01/04/2012  . Influenza, High Dose Seasonal PF 11/10/2015  . Influenza,inj,Quad PF,36+ Mos 01/22/2014  . Influenza-Unspecified 12/14/2012, 12/18/2014  . Pneumococcal Conjugate-13 01/22/2014  . Pneumococcal Polysaccharide-23 01/04/2012  . Td 01/22/2014   Screening Tests Health Maintenance  Topic Date Due  . OPHTHALMOLOGY EXAM  06/06/2016  . HEMOGLOBIN A1C  09/10/2016  . INFLUENZA VACCINE  09/13/2016  . FOOT EXAM  11/09/2016  . TETANUS/TDAP  01/23/2024    . DEXA SCAN  Completed  . PNA vac Low Risk Adult  Completed      Plan:   Schedule follow up appointment with Dr.[name].  Continue to eat heart healthy diet (full of fruits, vegetables, whole grains, lean protein, water--limit salt, fat, and sugar intake) and increase physical activity as tolerated.  Continue doing brain stimulating activities (puzzles, reading, adult coloring books, staying active) to keep memory sharp.   Please let us know when you are ready to schedule bone density scan.  I have personally reviewed and noted the following in the patient's chart:   . Medical and social history . Use of alcohol, tobacco or illicit drugs  . Current medications and supplements . Functional ability and status . Nutritional status . Physical activity . Advanced directives . List of other physicians . Hospitalizations,  surgeries, and ER visits in previous 12 months . Vitals . Screenings to include cognitive, depression, and falls . Referrals and appointments  In addition, I have reviewed and discussed with patient certain preventive protocols, quality metrics, and best practice recommendations. A written personalized care plan for preventive services as well as general preventive health recommendations were provided to patient.     Shela Nevin, RN  09/20/2016  I have reviewed the above MWE note by Ms. Vevelyn Royals and agree with her documentation Denny Peon MD

## 2016-09-20 ENCOUNTER — Ambulatory Visit (INDEPENDENT_AMBULATORY_CARE_PROVIDER_SITE_OTHER): Payer: Medicare Other | Admitting: *Deleted

## 2016-09-20 ENCOUNTER — Encounter: Payer: Self-pay | Admitting: *Deleted

## 2016-09-20 VITALS — BP 128/66 | HR 70 | Ht 66.0 in | Wt 226.0 lb

## 2016-09-20 DIAGNOSIS — Z0000 Encounter for general adult medical examination without abnormal findings: Secondary | ICD-10-CM

## 2016-09-20 NOTE — Patient Instructions (Addendum)
Ms. Mcgaughey , Thank you for taking time to come for your Medicare Wellness Visit. I appreciate your ongoing commitment to your health goals. Please review the following plan we discussed and let me know if I can assist you in the future.   These are the goals we discussed: Goals      Patient Stated   . Weight (lb) < 205 lb (93 kg) (pt-stated)          Eat healthy and exercise at least 10 min every day.       This is a list of the screening recommended for you and due dates:  Health Maintenance  Topic Date Due  . Eye exam for diabetics  06/06/2016  . Hemoglobin A1C  09/10/2016  . Flu Shot  09/13/2016  . Complete foot exam   11/09/2016  . Tetanus Vaccine  01/23/2024  . DEXA scan (bone density measurement)  Completed  . Pneumonia vaccines  Completed   Schedule follow up appointment with Dr.[name].  Continue to eat heart healthy diet (full of fruits, vegetables, whole grains, lean protein, water--limit salt, fat, and sugar intake) and increase physical activity as tolerated.  Continue doing brain stimulating activities (puzzles, reading, adult coloring books, staying active) to keep memory sharp.   Please let us know when you are ready to schedule bone density scan.   Sit-to-Stand Exercise The sit-to-stand exercise (also known as the chair stand or chair rise exercise) strengthens your lower body and helps you maintain or improve your mobility and independence. The goal is to do the sit-to-stand exercise without using your hands. This will be easier as you become stronger. You should always talk with your health care provider before starting any exercise program, especially if you have had recent surgery. Do the exercise exactly as told by your health care provider and adjust it as directed. It is normal to feel mild stretching, pulling, tightness, or discomfort as you do this exercise, but you should stop right away if you feel sudden pain or your pain gets worse. Do not begin doing  this exercise until told by your health care provider. What the sit-to-stand exercise does The sit-to-stand exercise helps to strengthen the muscles in your thighs and the muscles in the center of your body that give you stability (core muscles). This exercise is especially helpful if:  You have had knee or hip surgery.  You have trouble getting up from a chair, out of a car, or off the toilet.  How to do the sit-to-stand exercise 1. Sit toward the front edge of a sturdy chair without armrests. Your knees should be bent and your feet should be flat on the floor and shoulder-width apart. 2. Place your hands lightly on each side of the seat. Keep your back and neck as straight as possible, with your chest slightly forward. 3. Breathe in slowly. Lean forward and slightly shift your weight to the front of your feet. 4. Breathe out as you slowly stand up. Use your hands as little as possible. 5. Stand and pause for a full breath in and out. 6. Breathe in as you sit down slowly. Tighten your core and abdominal muscles to control your lowering as much as possible. 7. Breathe out slowly. 8. Do this exercise 10-15 times. If needed, do it fewer times until you build up strength. 9. Rest for 1 minute, then do another set of 10-15 repetitions. To change the difficulty of the sit-to-stand exercise  If the exercise is too  difficult, use a chair with sturdy armrests, and push off the armrests to help you come to the standing position. You can also use the armrests to help slowly lower yourself back to sitting. As this gets easier, try to use your arms less. You can also place a firm cushion or pillow on the chair to make the surface higher.  If this exercise is too easy, do not use your arms to help raise or lower yourself. You can also wear a weighted vest, use hand weights, increase your repetitions, or try a lower chair. General tips  You may feel tired when starting an exercise routine. This is  normal.  You may have muscle soreness that lasts a few days. This is normal. As you get stronger, you may not feel muscle soreness.  Use smooth, steady movements.  Do not  hold your breath during strength exercises. This can cause unsafe changes in your blood pressure.  Breathe in slowly through your nose, and breathe out slowly through your mouth. Summary  Strengthening your lower body is an important step to help you move safely and independently.  The sit-to-stand exercise helps strengthen the muscles in your thighs and core.  You should always talk with your health care provider before starting any exercise program, especially if you have had recent surgery. This information is not intended to replace advice given to you by your health care provider. Make sure you discuss any questions you have with your health care provider. Document Released: 03/23/2016 Document Revised: 03/23/2016 Document Reviewed: 03/23/2016 Elsevier Interactive Patient Education  2018 [employer] Calories are units of energy. Your body needs a certain amount of calories from food to keep you going throughout the day. When you eat more calories than your body needs, your body stores the extra calories as fat. When you eat fewer calories than your body needs, your body burns fat to get the energy it needs. Calorie counting means keeping track of how many calories you eat and drink each day. Calorie counting can be helpful if you need to lose weight. If you make sure to eat fewer calories than your body needs, you should lose weight. Ask your health care provider what a healthy weight is for you. For calorie counting to work, you will need to eat the right number of calories in a day in order to lose a healthy amount of weight per week. A dietitian can help you determine how many calories you need in a day and will give you suggestions on how to reach your calorie goal.  A healthy  amount of weight to lose per week is usually 1-2 lb (0.5-0.9 kg). This usually means that your daily calorie intake should be reduced by 500-750 calories.  Eating 1,200 - 1,500 calories per day can help most women lose weight.  Eating 1,500 - 1,800 calories per day can help most men lose weight.  What is my plan? My goal is to have __________ calories per day. If I have this many calories per day, I should lose around __________ pounds per week. What do I need to know about calorie counting? In order to meet your daily calorie goal, you will need to:  Find out how many calories are in each food you would like to eat. Try to do this before you eat.  Decide how much of the food you plan to eat.  Write down what you ate and how many calories it  had. Doing this is called keeping a food log.  To successfully lose weight, it is important to balance calorie counting with a healthy lifestyle that includes regular activity. Aim for 150 minutes of moderate exercise (such as walking) or 75 minutes of vigorous exercise (such as running) each week. Where do I find calorie information?  The number of calories in a food can be found on a Nutrition Facts label. If a food does not have a Nutrition Facts label, try to look up the calories online or ask your dietitian for help. Remember that calories are listed per serving. If you choose to have more than one serving of a food, you will have to multiply the calories per serving by the amount of servings you plan to eat. For example, the label on a package of bread might say that a serving size is 1 slice and that there are 90 calories in a serving. If you eat 1 slice, you will have eaten 90 calories. If you eat 2 slices, you will have eaten 180 calories. How do I keep a food log? Immediately after each meal, record the following information in your food log:  What you ate. Don't forget to include toppings, sauces, and other extras on the food.  How much you  ate. This can be measured in cups, ounces, or number of items.  How many calories each food and drink had.  The total number of calories in the meal.  Keep your food log near you, such as in a small notebook in your pocket, or use a mobile app or website. Some programs will calculate calories for you and show you how many calories you have left for the day to meet your goal. What are some calorie counting tips?  Use your calories on foods and drinks that will fill you up and not leave you hungry: ? Some examples of foods that fill you up are nuts and nut butters, vegetables, lean proteins, and high-fiber foods like whole grains. High-fiber foods are foods with more than 5 g fiber per serving. ? Drinks such as sodas, specialty coffee drinks, alcohol, and juices have a lot of calories, yet do not fill you up.  Eat nutritious foods and avoid empty calories. Empty calories are calories you get from foods or beverages that do not have many vitamins or protein, such as candy, sweets, and soda. It is better to have a nutritious high-calorie food (such as an avocado) than a food with few nutrients (such as a bag of chips).  Know how many calories are in the foods you eat most often. This will help you calculate calorie counts faster.  Pay attention to calories in drinks. Low-calorie drinks include water and unsweetened drinks.  Pay attention to nutrition labels for "low fat" or "fat free" foods. These foods sometimes have the same amount of calories or more calories than the full fat versions. They also often have added sugar, starch, or salt, to make up for flavor that was removed with the fat.  Find a way of tracking calories that works for you. Get creative. Try different apps or programs if writing down calories does not work for you. What are some portion control tips?  Know how many calories are in a serving. This will help you know how many servings of a certain food you can have.  Use a  measuring cup to measure serving sizes. You could also try weighing out portions on a kitchen scale. With time,  you will be able to estimate serving sizes for some foods.  Take some time to put servings of different foods on your favorite plates, bowls, and cups so you know what a serving looks like.  Try not to eat straight from a bag or box. Doing this can lead to overeating. Put the amount you would like to eat in a cup or on a plate to make sure you are eating the right portion.  Use smaller plates, glasses, and bowls to prevent overeating.  Try not to multitask (for example, watch TV or use your computer) while eating. If it is time to eat, sit down at a table and enjoy your food. This will help you to know when you are full. It will also help you to be aware of what you are eating and how much you are eating. What are tips for following this plan? Reading food labels  Check the calorie count compared to the serving size. The serving size may be smaller than what you are used to eating.  Check the source of the calories. Make sure the food you are eating is high in vitamins and protein and low in saturated and trans fats. Shopping  Read nutrition labels while you shop. This will help you make healthy decisions before you decide to purchase your food.  Make a grocery list and stick to it. Cooking  Try to cook your favorite foods in a healthier way. For example, try baking instead of frying.  Use low-fat dairy products. Meal planning  Use more fruits and vegetables. Half of your plate should be fruits and vegetables.  Include lean proteins like poultry and fish. How do I count calories when eating out?  Ask for smaller portion sizes.  Consider sharing an entree and sides instead of getting your own entree.  If you get your own entree, eat only half. Ask for a box at the beginning of your meal and put the rest of your entree in it so you are not tempted to eat it.  If calories  are listed on the menu, choose the lower calorie options.  Choose dishes that include vegetables, fruits, whole grains, low-fat dairy products, and lean protein.  Choose items that are boiled, broiled, grilled, or steamed. Stay away from items that are buttered, battered, fried, or served with cream sauce. Items labeled "crispy" are usually fried, unless stated otherwise.  Choose water, low-fat milk, unsweetened iced tea, or other drinks without added sugar. If you want an alcoholic beverage, choose a lower calorie option such as a glass of wine or light beer.  Ask for dressings, sauces, and syrups on the side. These are usually high in calories, so you should limit the amount you eat.  If you want a salad, choose a garden salad and ask for grilled meats. Avoid extra toppings like bacon, cheese, or fried items. Ask for the dressing on the side, or ask for olive oil and vinegar or lemon to use as dressing.  Estimate how many servings of a food you are given. For example, a serving of cooked rice is  cup or about the size of half a baseball. Knowing serving sizes will help you be aware of how much food you are eating at restaurants. The list below tells you how big or small some common portion sizes are based on everyday objects: ? 1 oz-4 stacked dice. ? 3 oz-1 deck of cards. ? 1 tsp-1 die. ? 1 Tbsp- a ping-pong ball. ?  2 Tbsp-1 ping-pong ball. ?  cup- baseball. ? 1 cup-1 baseball. Summary  Calorie counting means keeping track of how many calories you eat and drink each day. If you eat fewer calories than your body needs, you should lose weight.  A healthy amount of weight to lose per week is usually 1-2 lb (0.5-0.9 kg). This usually means reducing your daily calorie intake by 500-750 calories.  The number of calories in a food can be found on a Nutrition Facts label. If a food does not have a Nutrition Facts label, try to look up the calories online or ask your dietitian for help.  Use  your calories on foods and drinks that will fill you up, and not on foods and drinks that will leave you hungry.  Use smaller plates, glasses, and bowls to prevent overeating. This information is not intended to replace advice given to you by your health care provider. Make sure you discuss any questions you have with your health care provider. Document Released: 01/30/2005 Document Revised: 12/31/2015 Document Reviewed: 12/31/2015 Elsevier Interactive Patient Education  2017 [employer] Diabetes Mellitus and Food It is important for you to manage your blood sugar (glucose) level. Your blood glucose level can be greatly affected by what you eat. Eating healthier foods in the appropriate amounts throughout the day at about the same time each day will help you control your blood glucose level. It can also help slow or prevent worsening of your diabetes mellitus. Healthy eating may even help you improve the level of your blood pressure and reach or maintain a healthy weight. General recommendations for healthful eating and cooking habits include:  Eating meals and snacks regularly. Avoid going long periods of time without eating to lose weight.  Eating a diet that consists mainly of plant-based foods, such as fruits, vegetables, nuts, legumes, and whole grains.  Using low-heat cooking methods, such as baking, instead of high-heat cooking methods, such as deep frying.  Work with your dietitian to make sure you understand how to use the Nutrition Facts information on food labels. How can food affect me? Carbohydrates Carbohydrates affect your blood glucose level more than any other type of food. Your dietitian will help you determine how many carbohydrates to eat at each meal and teach you how to count carbohydrates. Counting carbohydrates is important to keep your blood glucose at a healthy level, especially if you are using insulin or taking certain medicines for diabetes  mellitus. Alcohol Alcohol can cause sudden decreases in blood glucose (hypoglycemia), especially if you use insulin or take certain medicines for diabetes mellitus. Hypoglycemia can be a life-threatening condition. Symptoms of hypoglycemia (sleepiness, dizziness, and disorientation) are similar to symptoms of having too much alcohol. If your health care provider has given you approval to drink alcohol, do so in moderation and use the following guidelines:  Women should not have more than one drink per day, and men should not have more than two drinks per day. One drink is equal to: ? 12 oz of beer. ? 5 oz of wine. ? 1 oz of hard liquor.  Do not drink on an empty stomach.  Keep yourself hydrated. Have water, diet soda, or unsweetened iced tea.  Regular soda, juice, and other mixers might contain a lot of carbohydrates and should be counted.  What foods are not recommended? As you make food choices, it is important to remember that all foods are not the same. Some foods have fewer nutrients per serving than other  foods, even though they might have the same number of calories or carbohydrates. It is difficult to get your body what it needs when you eat foods with fewer nutrients. Examples of foods that you should avoid that are high in calories and carbohydrates but low in nutrients include:  Trans fats (most processed foods list trans fats on the Nutrition Facts label).  Regular soda.  Juice.  Candy.  Sweets, such as cake, pie, doughnuts, and cookies.  Fried foods.  What foods can I eat? Eat nutrient-rich foods, which will nourish your body and keep you healthy. The food you should eat also will depend on several factors, including:  The calories you need.  The medicines you take.  Your weight.  Your blood glucose level.  Your blood pressure level.  Your cholesterol level.  You should eat a variety of foods, including:  Protein. ? Lean cuts of meat. ? Proteins low in  saturated fats, such as fish, egg whites, and beans. Avoid processed meats.  Fruits and vegetables. ? Fruits and vegetables that may help control blood glucose levels, such as apples, mangoes, and yams.  Dairy products. ? Choose fat-free or low-fat dairy products, such as milk, yogurt, and cheese.  Grains, bread, pasta, and rice. ? Choose whole grain products, such as multigrain bread, whole oats, and brown rice. These foods may help control blood pressure.  Fats. ? Foods containing healthful fats, such as nuts, avocado, olive oil, canola oil, and fish.  Does everyone with diabetes mellitus have the same meal plan? Because every person with diabetes mellitus is different, there is not one meal plan that works for everyone. It is very important that you meet with a dietitian who will help you create a meal plan that is just right for you. This information is not intended to replace advice given to you by your health care provider. Make sure you discuss any questions you have with your health care provider. Document Released: 10/27/2004 Document Revised: 07/08/2015 Document Reviewed: 12/27/2012 Elsevier Interactive Patient Education  2017 [employer].

## 2016-12-04 DIAGNOSIS — H2511 Age-related nuclear cataract, right eye: Secondary | ICD-10-CM | POA: Diagnosis not present

## 2016-12-04 DIAGNOSIS — H26492 Other secondary cataract, left eye: Secondary | ICD-10-CM | POA: Diagnosis not present

## 2016-12-04 DIAGNOSIS — H524 Presbyopia: Secondary | ICD-10-CM | POA: Diagnosis not present

## 2016-12-04 DIAGNOSIS — H25011 Cortical age-related cataract, right eye: Secondary | ICD-10-CM | POA: Diagnosis not present

## 2017-01-11 DIAGNOSIS — Z23 Encounter for immunization: Secondary | ICD-10-CM | POA: Diagnosis not present

## 2017-03-17 NOTE — Progress Notes (Addendum)
[facility] [place], [place], [place] [address] [phone] [phone]  Date:  03/21/2017   Name:  Anne [name]   DOB:  1940-05-27   MRN:  [id number]  PCP:  Darreld Mclean, MD    Chief Complaint: Annual Exam (Pt here for CPE. )   History of Present Illness:  Anne [name] is a 77 y.o. very pleasant female patient who presents with the following:  Here today for a physical exam History of DM, hyperlipidemia, post- surgical hypothyroidism, thyroid cancer, HTN From my note about one year ago: Here today for a general check up History of DM, HTN, hyperlipidemia, thyroid cancer s/p thyroidectomy She is overall feeling well herself, but is very concerned about her only grandson, 69 yo Anne [name], who has been dx with a meningioma brain tumor.  He lives in [place] with her daughter- who is expecting- and SIL.  They have been up there a lot and spending a lot of time helping out.  She feels that she is not depressed or really anxious, but is understandably feeling very worried and upset.  At this time she does not desire medication for anxiety or stress- she feels that she is handling things ok but will let me know if she needs help Colonoscopy due- discussed cologuard and she would like to have this test instead of a colonoscopy at this time Notes that her BP is well controlled when she checks it a home QOD  Lab Results  Component Value Date   HGBA1C 6.6 (H) 03/13/2016   Her daughter in [place] is doing ok- she did have a baby girl, and Anne [name] is doing pretty well !  He had another operation and is now back in school, following up every few months.  Overall news is good  Thyroid cancer- ?follow-up.  Per pt she has not noted any masses in her neck or other concerning findings. She was not instructed to have any other follow-up per her report, except for TSh We will check her TSH for her today  She is fasting today for labs  She did  do her medicare wellness exam with Anne [name]  shingrix- discussed with her  She did do cologuard at home- she thinks sometimes in the last year. I did not get her result that I can see - we called and got this report, it was negative   mammo 1/18- she has scheduled for this month dexa- this is due, will order for her  Patient Active Problem List   Diagnosis Date Noted  . Controlled type 2 diabetes mellitus without complication, without long-term current use of insulin ([place]) 03/31/2015  . Chest pressure 03/03/2013  . Globus sensation 03/03/2013  . DOE (dyspnea on exertion) 03/03/2013  . Thyroid cancer ([place]) 01/05/2012  . Hyperlipidemia 01/05/2012  . Dysphonia 01/05/2012  . 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 Hypertension 01/04/2012  . Hypothyroid 01/04/2012    Past Medical History:  Diagnosis Date  . Arthritis   . Cataract   . [place]   . Hypertension   . Thyroid disease    thyroidectomy in 2011    Past Surgical History:  Procedure Laterality Date  . CATARACT EXTRACTION    . EYE SURGERY    . JOINT REPLACEMENT    . THYROIDECTOMY  12/25/2009  . TONSILLECTOMY AND ADENOIDECTOMY    . 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 TOTAL HIP ARTHROPLASTY Right 06/03/2008  Social History   Tobacco Use  . Smoking status: Never Smoker  . Smokeless tobacco: Never Used  Substance Use Topics  . Alcohol use: No  . Drug use: No    Family History  Problem Relation Age of Onset  . Stroke Mother   . Cancer Father   . Cancer Sister   . Cancer Sister   . Cancer Sister   . Miscarriages / Stillbirths Neg Hx     No Known Allergies  Medication list has been reviewed and updated.  Current Outpatient Medications on File Prior to Visit  Medication Sig Dispense Refill  . acetaminophen (TYLENOL) 500 MG tablet Take 500 mg by mouth every 6 (six) hours as needed for mild pain.    Marland Kitchen aspirin 81 MG tablet Take 81 mg by mouth daily.    . Calcium-Phosphorus-Vitamin D (CITRACAL +D3  PO) Take 2 tablets by mouth 2 (two) times daily.    . Cholecalciferol (VITAMIN D-3) 5000 UNITS TABS Take by mouth daily. Reported on 02/24/2015    . Glycerin-Hypromellose-PEG 400 ([facility] DRY EYE RELIEF OP) Apply 2 drops to eye daily.    Marland Kitchen guaiFENesin (ROBITUSSIN) 100 MG/5ML liquid Take 200 mg by mouth as needed.     . Multiple Vitamin (MULTI VITAMIN DAILY PO) Take by mouth daily. Reported on 02/24/2015     No current facility-administered medications on file prior to visit.     Review of Systems:  As per HPI- otherwise negative. She does monitor her BP at home, notes it is generally 140/60 No CP or SOB No tingling or numbness of her feet   Physical Examination: Vitals:   03/21/17 1007  BP: (!) 162/88  Pulse: 77  Temp: 97.6 F (36.4 C)  SpO2: 98%   Vitals:   03/21/17 1007  Weight: 226 lb 3.2 oz (102.6 kg)  Height: 5' 5.5" (1.664 m)   Body mass index is 37.07 kg/m. Ideal Body Weight: Weight in (lb) to have BMI = 25: 152.2  GEN: WDWN, NAD, Non-toxic, A & O x 3, obese, looks well  HEENT: Atraumatic, Normocephalic. Neck supple. No masses, No LAD.  Bilateral TM wnl, oropharynx normal.  PEERL,EOMI.   Ears and Nose: No external deformity. CV: RRR, No M/G/R. No JVD. No thrill. No extra heart sounds. PULM: CTA B, no wheezes, crackles, rhonchi. No retractions. No resp. distress. No accessory muscle use. ABD: S, NT, ND, +BS. No rebound. No HSM. EXTR: No c/c/e NEURO Normal gait for pt- slow and she does use a cane, but gets around ok  PSYCH: Normally interactive. Conversant. Not depressed or anxious appearing.  Calm demeanor.    Assessment and Plan: Controlled type 2 diabetes mellitus without complication, without long-term current use of insulin ([place]) - Plan: Comprehensive metabolic panel, Hemoglobin A1c, metFORMIN (GLUCOPHAGE) 500 MG tablet  Essential hypertension - Plan: CBC, losartan-hydrochlorothiazide (HYZAAR) 50-12.5 MG tablet  Postoperative hypothyroidism - Plan: TSH,  levothyroxine (SYNTHROID, LEVOTHROID) 100 MCG tablet  Mixed hyperlipidemia - Plan: Lipid panel, atorvastatin (LIPITOR) 20 MG tablet  Gastroesophageal reflux disease without esophagitis - Plan: famotidine (PEPCID) 10 MG tablet  Estrogen deficiency - Plan: DG Bone Density  Refilled her medications and ordreed labs for her today Ordered dexa scan We called and did get verbal confirmation of negative cologuard result they are faxing this to [place]  Will plan further follow- up pending labs.   Signed Lamar Blinks, MD  Received her labs 2/9-  Have called a few times, not able to reach her [facility] again  2/13 Called again 2/14- reached her.  She had been out of her thyroid med for a day or so prior to labs. Will plan to have her continue the 189mcg, order TSH to draw in one month for her Will also recheck CBC- macrocytosis.  B212 and folate levels are not covered for this dx so will hold off on checking these unless this persists  Labs letter to pt otherwise  Results for orders placed or performed in visit on 03/21/17  CBC  Result Value Ref Range   WBC 5.4 4.0 - 10.5 K/uL   RBC 3.88 3.87 - 5.11 Mil/uL   Platelets 226.0 150.0 - 400.0 K/uL   Hemoglobin 13.8 12.0 - 15.0 g/dL   HCT 39.7 36.0 - 46.0 %   MCV 102.4 (H) 78.0 - 100.0 fl   MCHC 34.7 30.0 - 36.0 g/dL   RDW 15.1 11.5 - 15.5 %  Comprehensive metabolic panel  Result Value Ref Range   Sodium 140 135 - 145 mEq/L   Potassium 3.7 3.5 - 5.1 mEq/L   Chloride 101 96 - 112 mEq/L   CO2 30 19 - 32 mEq/L   Glucose, Bld 135 (H) 70 - 99 mg/dL   BUN 16 6 - 23 mg/dL   Creatinine, Ser 0.79 0.40 - 1.20 mg/dL   Total Bilirubin 0.6 0.2 - 1.2 mg/dL   Alkaline Phosphatase 76 39 - 117 U/L   AST 18 0 - 37 U/L   ALT 19 0 - 35 U/L   Total Protein 7.8 6.0 - 8.3 g/dL   Albumin 4.3 3.5 - 5.2 g/dL   Calcium 9.8 8.4 - 10.5 mg/dL   GFR 90.76 >60.00 mL/min  Lipid panel  Result Value Ref Range   Cholesterol 157 0 - 200 mg/dL   Triglycerides 143.0 0.0 -  149.0 mg/dL   HDL 41.70 >39.00 mg/dL   VLDL 28.6 0.0 - 40.0 mg/dL   LDL Cholesterol 87 0 - 99 mg/dL   Total CHOL/HDL Ratio 4    NonHDL 115.66   Hemoglobin A1c  Result Value Ref Range   Hgb A1c MFr Bld 7.1 (H) 4.6 - 6.5 %  TSH  Result Value Ref Range   TSH 7.19 (H) 0.35 - 4.50 uIU/mL

## 2017-03-20 ENCOUNTER — Other Ambulatory Visit: Payer: Self-pay | Admitting: Family Medicine

## 2017-03-20 DIAGNOSIS — Z1231 Encounter for screening mammogram for malignant neoplasm of breast: Secondary | ICD-10-CM

## 2017-03-21 ENCOUNTER — Encounter: Payer: Self-pay | Admitting: Family Medicine

## 2017-03-21 ENCOUNTER — Ambulatory Visit (INDEPENDENT_AMBULATORY_CARE_PROVIDER_SITE_OTHER): Payer: Medicare Other | Admitting: Family Medicine

## 2017-03-21 VITALS — BP 142/85 | HR 77 | Temp 97.6°F | Ht 65.5 in | Wt 226.2 lb

## 2017-03-21 DIAGNOSIS — D7589 Other specified diseases of blood and blood-forming organs: Secondary | ICD-10-CM | POA: Diagnosis not present

## 2017-03-21 DIAGNOSIS — E782 Mixed hyperlipidemia: Secondary | ICD-10-CM | POA: Diagnosis not present

## 2017-03-21 DIAGNOSIS — E119 Type 2 diabetes mellitus without complications: Secondary | ICD-10-CM

## 2017-03-21 DIAGNOSIS — K219 Gastro-esophageal reflux disease without esophagitis: Secondary | ICD-10-CM | POA: Diagnosis not present

## 2017-03-21 DIAGNOSIS — E2839 Other primary ovarian failure: Secondary | ICD-10-CM

## 2017-03-21 DIAGNOSIS — E890 Postprocedural hypothyroidism: Secondary | ICD-10-CM | POA: Diagnosis not present

## 2017-03-21 DIAGNOSIS — I10 Essential (primary) hypertension: Secondary | ICD-10-CM | POA: Diagnosis not present

## 2017-03-21 LAB — CBC
HEMATOCRIT: 39.7 % (ref 36.0–46.0)
Hemoglobin: 13.8 g/dL (ref 12.0–15.0)
MCHC: 34.7 g/dL (ref 30.0–36.0)
MCV: 102.4 fl — AB (ref 78.0–100.0)
Platelets: 226 10*3/uL (ref 150.0–400.0)
RBC: 3.88 Mil/uL (ref 3.87–5.11)
RDW: 15.1 % (ref 11.5–15.5)
WBC: 5.4 10*3/uL (ref 4.0–10.5)

## 2017-03-21 LAB — COMPREHENSIVE METABOLIC PANEL
ALK PHOS: 76 U/L (ref 39–117)
ALT: 19 U/L (ref 0–35)
AST: 18 U/L (ref 0–37)
Albumin: 4.3 g/dL (ref 3.5–5.2)
BILIRUBIN TOTAL: 0.6 mg/dL (ref 0.2–1.2)
BUN: 16 mg/dL (ref 6–23)
CO2: 30 mEq/L (ref 19–32)
CREATININE: 0.79 mg/dL (ref 0.40–1.20)
Calcium: 9.8 mg/dL (ref 8.4–10.5)
Chloride: 101 mEq/L (ref 96–112)
GFR: 90.76 mL/min (ref >60.00)
Glucose, Bld: 135 mg/dL — ABNORMAL HIGH (ref 70–99)
Potassium: 3.7 mEq/L (ref 3.5–5.1)
SODIUM: 140 meq/L (ref 135–145)
Total Protein: 7.8 g/dL (ref 6.0–8.3)

## 2017-03-21 LAB — HEMOGLOBIN A1C: Hgb A1c MFr Bld: 7.1 % — ABNORMAL HIGH (ref 4.6–6.5)

## 2017-03-21 LAB — LIPID PANEL
CHOL/HDL RATIO: 4
CHOLESTEROL: 157 mg/dL (ref 0–200)
HDL: 41.7 mg/dL (ref >39.00)
LDL Cholesterol: 87 mg/dL (ref 0–99)
NonHDL: 115.66
TRIGLYCERIDES: 143 mg/dL (ref 0.0–149.0)
VLDL: 28.6 mg/dL (ref 0.0–40.0)

## 2017-03-21 LAB — TSH: TSH: 7.19 u[IU]/mL — AB (ref 0.35–4.50)

## 2017-03-21 MED ORDER — ATORVASTATIN CALCIUM 20 MG PO TABS: ORAL_TABLET | ORAL | 3 refills | Status: DC

## 2017-03-21 MED ORDER — LEVOTHYROXINE SODIUM 100 MCG PO TABS: 100.0000 ug | ORAL_TABLET | Freq: Every day | ORAL | 3 refills | Status: DC

## 2017-03-21 MED ORDER — METFORMIN HCL 500 MG PO TABS: ORAL_TABLET | ORAL | 3 refills | Status: DC

## 2017-03-21 MED ORDER — FAMOTIDINE 10 MG PO TABS: 10.0000 mg | ORAL_TABLET | Freq: Two times a day (BID) | ORAL | 3 refills | Status: DC

## 2017-03-21 MED ORDER — LOSARTAN POTASSIUM-HCTZ 50-12.5 MG PO TABS: 1.0000 | ORAL_TABLET | Freq: Every day | ORAL | 3 refills | Status: DC

## 2017-03-21 NOTE — Patient Instructions (Addendum)
It was good to see you today- I am so glad to hear that Anne [name] is doing better!!   This is really great news I will be in touch with your labs asap, and refilled your medications today I ordered a bone density scan for you to have done when you have your mammogram You may wish to have the Shingles vaccine series (Shingrix) done at your pharmacy at your convenience   Your Cologuard test was negative - good news!    Health Maintenance for Postmenopausal Women Menopause is a normal process in which your reproductive ability comes to an end. This process happens gradually over a span of months to years, usually between the ages of 96 and 23. Menopause is complete when you have missed 12 consecutive menstrual periods. It is important to talk with your health care provider about some of the most common conditions that affect postmenopausal women, such as heart disease, cancer, and bone loss (osteoporosis). Adopting a healthy lifestyle and getting preventive care can help to promote your health and wellness. Those actions can also lower your chances of developing some of these common conditions. What should I know about menopause? During menopause, you may experience a number of symptoms, such as:  Moderate-to-severe hot flashes.  Night sweats.  Decrease in sex drive.  Mood swings.  Headaches.  Tiredness.  Irritability.  Memory problems.  Insomnia.  Choosing to treat or not to treat menopausal changes is an individual decision that you make with your health care provider. What should I know about hormone replacement therapy and supplements? Hormone therapy products are effective for treating symptoms that are associated with menopause, such as hot flashes and night sweats. Hormone replacement carries certain risks, especially as you become older. If you are thinking about using estrogen or estrogen with progestin treatments, discuss the benefits and risks with your health care  provider. What should I know about heart disease and stroke? Heart disease, heart attack, and stroke become more likely as you age. This may be due, in part, to the hormonal changes that your body experiences during menopause. These can affect how your body processes dietary fats, triglycerides, and cholesterol. Heart attack and stroke are both medical emergencies. There are many things that you can do to help prevent heart disease and stroke:  Have your blood pressure checked at least every 1-2 years. High blood pressure causes heart disease and increases the risk of stroke.  If you are 75-59 years old, ask your health care provider if you should take aspirin to prevent a heart attack or a stroke.  Do not use any tobacco products, including cigarettes, chewing tobacco, or electronic cigarettes. If you need help quitting, ask your health care provider.  It is important to eat a healthy diet and maintain a healthy weight. ? Be sure to include plenty of vegetables, fruits, low-fat dairy products, and lean protein. ? Avoid eating foods that are high in solid fats, added sugars, or salt (sodium).  Get regular exercise. This is one of the most important things that you can do for your health. ? Try to exercise for at least 150 minutes each week. The type of exercise that you do should increase your heart rate and make you sweat. This is known as moderate-intensity exercise. ? Try to do strengthening exercises at least twice each week. Do these in addition to the moderate-intensity exercise.  Know your numbers.Ask your health care provider to check your cholesterol and your blood glucose. Continue to have  your blood tested as directed by your health care provider.  What should I know about cancer screening? There are several types of cancer. Take the following steps to reduce your risk and to catch any cancer development as early as possible. Breast Cancer  Practice breast self-awareness. ? This  means understanding how your breasts normally appear and feel. ? It also means doing regular breast self-exams. Let your health care provider know about any changes, no matter how small.  If you are 66 or older, have a clinician do a breast exam (clinical breast exam or CBE) every year. Depending on your age, family history, and medical history, it may be recommended that you also have a yearly breast X-ray (mammogram).  If you have a family history of breast cancer, talk with your health care provider about genetic screening.  If you are at high risk for breast cancer, talk with your health care provider about having an MRI and a mammogram every year.  Breast cancer (BRCA) gene test is recommended for women who have family members with BRCA-related cancers. Results of the assessment will determine the need for genetic counseling and BRCA1 and for BRCA2 testing. BRCA-related cancers include these types: ? Breast. This occurs in males or females. ? Ovarian. ? Tubal. This may also be called fallopian tube cancer. ? Cancer of the abdominal or pelvic lining (peritoneal cancer). ? Prostate. ? Pancreatic.  Cervical, Uterine, and Ovarian Cancer Your health care provider may recommend that you be screened regularly for cancer of the pelvic organs. These include your ovaries, uterus, and vagina. This screening involves a pelvic exam, which includes checking for microscopic changes to the surface of your cervix (Pap test).  For women ages 21-65, health care providers may recommend a pelvic exam and a Pap test every three years. For women ages 73-65, they may recommend the Pap test and pelvic exam, combined with testing for human papilloma virus (HPV), every five years. Some types of HPV increase your risk of cervical cancer. Testing for HPV may also be done on women of any age who have unclear Pap test results.  Other health care providers may not recommend any screening for nonpregnant women who are  considered low risk for pelvic cancer and have no symptoms. Ask your health care provider if a screening pelvic exam is right for you.  If you have had past treatment for cervical cancer or a condition that could lead to cancer, you need Pap tests and screening for cancer for at least 20 years after your treatment. If Pap tests have been discontinued for you, your risk factors (such as having a new sexual partner) need to be reassessed to determine if you should start having screenings again. Some women have medical problems that increase the chance of getting cervical cancer. In these cases, your health care provider may recommend that you have screening and Pap tests more often.  If you have a family history of uterine cancer or ovarian cancer, talk with your health care provider about genetic screening.  If you have vaginal bleeding after reaching menopause, tell your health care provider.  There are currently no reliable tests available to screen for ovarian cancer.  Lung Cancer Lung cancer screening is recommended for adults 58-40 years old who are at high risk for lung cancer because of a history of smoking. A yearly low-dose CT scan of the lungs is recommended if you:  Currently smoke.  Have a history of at least 30 pack-years of  smoking and you currently smoke or have quit within the past 15 years. A pack-year is smoking an average of one pack of cigarettes per day for one year.  Yearly screening should:  Continue until it has been 15 years since you quit.  Stop if you develop a health problem that would prevent you from having lung cancer treatment.  Colorectal Cancer  This type of cancer can be detected and can often be prevented.  Routine colorectal cancer screening usually begins at age 5 and continues through age 12.  If you have risk factors for colon cancer, your health care provider may recommend that you be screened at an earlier age.  If you have a family history of  colorectal cancer, talk with your health care provider about genetic screening.  Your health care provider may also recommend using home test kits to check for hidden blood in your stool.  A small camera at the end of a tube can be used to examine your colon directly (sigmoidoscopy or colonoscopy). This is done to check for the earliest forms of colorectal cancer.  Direct examination of the colon should be repeated every 5-10 years until age 63. However, if early forms of precancerous polyps or small growths are found or if you have a family history or genetic risk for colorectal cancer, you may need to be screened more often.  Skin Cancer  Check your skin from head to toe regularly.  Monitor any moles. Be sure to tell your health care provider: ? About any new moles or changes in moles, especially if there is a change in a mole's shape or color. ? If you have a mole that is larger than the size of a pencil eraser.  If any of your family members has a history of skin cancer, especially at a young age, talk with your health care provider about genetic screening.  Always use sunscreen. Apply sunscreen liberally and repeatedly throughout the day.  Whenever you are outside, protect yourself by wearing long sleeves, pants, a wide-brimmed hat, and sunglasses.  What should I know about osteoporosis? Osteoporosis is a condition in which bone destruction happens more quickly than new bone creation. After menopause, you may be at an increased risk for osteoporosis. To help prevent osteoporosis or the bone fractures that can happen because of osteoporosis, the following is recommended:  If you are 19-49 years old, get at least 1,000 mg of calcium and at least 600 mg of vitamin D per day.  If you are older than age 64 but younger than age 34, get at least 1,200 mg of calcium and at least 600 mg of vitamin D per day.  If you are older than age 61, get at least 1,200 mg of calcium and at least 800 mg  of vitamin D per day.  Smoking and excessive alcohol intake increase the risk of osteoporosis. Eat foods that are rich in calcium and vitamin D, and do weight-bearing exercises several times each week as directed by your health care provider. What should I know about how menopause affects my mental health? Depression may occur at any age, but it is more common as you become older. Common symptoms of depression include:  Low or sad mood.  Changes in sleep patterns.  Changes in appetite or eating patterns.  Feeling an overall lack of motivation or enjoyment of activities that you previously enjoyed.  Frequent crying spells.  Talk with your health care provider if you think that you are  experiencing depression. What should I know about immunizations? It is important that you get and maintain your immunizations. These include:  Tetanus, diphtheria, and pertussis (Tdap) booster vaccine.  Influenza every year before the flu season begins.  Pneumonia vaccine.  Shingles vaccine.  Your health care provider may also recommend other immunizations. This information is not intended to replace advice given to you by your health care provider. Make sure you discuss any questions you have with your health care provider. Document Released: 03/24/2005 Document Revised: 08/20/2015 Document Reviewed: 11/03/2014 Elsevier Interactive Patient Education  2018 [employer].

## 2017-03-29 ENCOUNTER — Telehealth: Payer: Self-pay

## 2017-03-29 NOTE — Addendum Note (Signed)
Addended by: Lamar Blinks C on: 03/29/2017 09:21 AM   Modules accepted: Orders

## 2017-03-29 NOTE — Telephone Encounter (Signed)
Copied from [place]. Topic: General - Call Back - No Documentation >> Mar 29, 2017  9:03 AM Marin Olp L wrote: Reason for CRM: Patient says Dr. Lorelei Pont called her about her medications post CPE last week and wanted her to call her back before she called anything into her pharm.

## 2017-04-10 ENCOUNTER — Ambulatory Visit
Admission: RE | Admit: 2017-04-10 | Discharge: 2017-04-10 | Disposition: A | Discharge status: Home / Self Care | Payer: Medicare Other | Source: Ambulatory Visit | Attending: Family Medicine | Admitting: Family Medicine

## 2017-04-10 DIAGNOSIS — Z1231 Encounter for screening mammogram for malignant neoplasm of breast: Secondary | ICD-10-CM

## 2017-04-10 DIAGNOSIS — Z780 Asymptomatic menopausal state: Secondary | ICD-10-CM | POA: Diagnosis not present

## 2017-04-10 DIAGNOSIS — E2839 Other primary ovarian failure: Secondary | ICD-10-CM

## 2017-04-15 ENCOUNTER — Other Ambulatory Visit: Payer: Self-pay | Admitting: Family Medicine

## 2017-04-16 ENCOUNTER — Encounter: Payer: Medicare Other | Admitting: Family Medicine

## 2017-04-25 ENCOUNTER — Other Ambulatory Visit (INDEPENDENT_AMBULATORY_CARE_PROVIDER_SITE_OTHER): Payer: Medicare Other

## 2017-04-25 DIAGNOSIS — D7589 Other specified diseases of blood and blood-forming organs: Secondary | ICD-10-CM

## 2017-04-25 DIAGNOSIS — E890 Postprocedural hypothyroidism: Secondary | ICD-10-CM | POA: Diagnosis not present

## 2017-04-25 LAB — CBC
HEMATOCRIT: 37.8 % (ref 36.0–46.0)
HEMOGLOBIN: 13.2 g/dL (ref 12.0–15.0)
MCHC: 35 g/dL (ref 30.0–36.0)
MCV: 103.1 fl — AB (ref 78.0–100.0)
Platelets: 246 10*3/uL (ref 150.0–400.0)
RBC: 3.67 Mil/uL — ABNORMAL LOW (ref 3.87–5.11)
RDW: 15.6 % — ABNORMAL HIGH (ref 11.5–15.5)
WBC: 4.5 10*3/uL (ref 4.0–10.5)

## 2017-04-25 LAB — TSH: TSH: 3.2 u[IU]/mL (ref 0.35–4.50)

## 2017-04-26 ENCOUNTER — Encounter: Payer: Self-pay | Admitting: Family Medicine

## 2017-05-21 ENCOUNTER — Telehealth: Payer: Self-pay | Admitting: Emergency Medicine

## 2017-05-21 NOTE — Telephone Encounter (Signed)
Called pt to find out what questions she has about her labs. On her results letter it mentioned that her blood count showed high MCV. Pt wanted to know what MCV is? Informed pt that MCV is a blood test measures the average size of your red blood cells. Dr. Lorelei Pont has asked pt to start B complex and recheck in 2-3 months. Pt states that she has purchased the B complex and is currently taking. Asked if I could schedule lab appt for pt to recheck, pt states that she will call back once she can look at her schedule.   Copied from [place] [phone]. Topic: Quick Communication - Lab Results >> May 18, 2017 12:53 PM Scherrie Gerlach wrote: Pt has some questions about her lab results. Pt aware Dr Lorelei Pont is out until Monday and she is ok to wait until then.  Pt states she had been out of town and labs were a couple of weeks ago anyway.

## 2017-07-21 NOTE — Progress Notes (Addendum)
[facility] [address], [place], [place] [address] [phone]  Date:  07/23/2017   Name:  Anne [name]   DOB:  1940-11-20   MRN:  [id number]  PCP:  Darreld Mclean, MD    Chief Complaint: Diabetes (3 month follow up, lab work-MCV)   History of Present Illness:  Anne [name] is a 77 y.o. very pleasant female patient who presents with the following:  Follow up visit otday History of DM, hyperlipidemia, thyroid cancer/ now hypothyroid, HTN From our last visit in February:  She is overall feeling well herself, but is very concerned about her only grandson, 63 yo Harrell Gave, who has been dx with a meningioma brain tumor. He lives in [place] with her daughter- who is expecting- and SIL. They have been up there a lot and spending a lot of time helping out. She feels that she is not depressed or really anxious, but is understandably feeling very worried and upset. At this time she does not desire medication for anxiety or stress- she feels that she is handling things ok but will let me know if she needs help Colonoscopy due- discussed cologuard and she would like to have this test instead of a colonoscopy at this time Notes that her BP is well controlled when she checks it a home QOD Update: Her daughter in [place] is doing ok- she did have a baby girl, and Harrell Gave is doing pretty well !  He had another operation and is now back in school, following up every few months.  Overall news is good  Thyroid cancer- ?follow-up.  Per pt she has not noted any masses in her neck or other concerning findings. She was not instructed to have any other follow-up per her report, except for TSH We will check her TSH for her today  shingrix- discussed with her, she is still trying to find a pharmacy that has this shot for her  Mammo: 2/19- negative dexa-2/19. normal Optho; this is coming up in August   Labs at last visit showed elevated  TSH and macrocytosis.  Repeat TSH was ok- she is on synthroid 100 MCG at this time   Their grand-daughter Thompson Caul is doing well and walking at 9 months, and things are stable with Harrell Gave which is wonderful She is a non drinker so this is not the cause of her high MCV,  She is taking an OTC B complex currently   Lab Results  Component Value Date   TSH 3.20 04/25/2017   Lab Results  Component Value Date   HGBA1C 7.1 (H) 03/21/2017   Lab Results  Component Value Date   WBC 4.5 04/25/2017   HGB 13.2 04/25/2017   HCT 37.8 04/25/2017   MCV 103.1 (H) 04/25/2017   PLT 246.0 04/25/2017    Noted that BP is higher today- they do monitor her BP at home and note that they generally get 130s/ 80s She did take her BP meds this am as usual For DM she is taking metformin 500 mg once a day only   Patient Active Problem List   Diagnosis Date Noted  . Controlled type 2 diabetes mellitus without complication, without long-term current use of insulin ([place]) 03/31/2015  . Chest pressure 03/03/2013  . Globus sensation 03/03/2013  . DOE (dyspnea on exertion) 03/03/2013  . Thyroid cancer ([place]) 01/05/2012  . Hyperlipidemia 01/05/2012  . Dysphonia 01/05/2012  . 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  Hypertension 01/04/2012  . Hypothyroid 01/04/2012    Past Medical History:  Diagnosis Date  . Arthritis   . Cataract   . Comedone   . Hypertension   . Thyroid disease    thyroidectomy in 2011    Past Surgical History:  Procedure Laterality Date  . CATARACT EXTRACTION    . EYE SURGERY    . JOINT REPLACEMENT    . THYROIDECTOMY  12/25/2009  . TONSILLECTOMY AND ADENOIDECTOMY    . 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 TOTAL HIP ARTHROPLASTY Right 06/03/2008    Social History   Tobacco Use  . Smoking status: Never Smoker  . Smokeless tobacco: Never Used  Substance Use Topics  . Alcohol use: No  . Drug use: No    Family History  Problem Relation Age of Onset  . Stroke  Mother   . Cancer Father   . Cancer Sister   . Cancer Sister   . Cancer Sister   . Miscarriages / Stillbirths Neg Hx     No Known Allergies  Medication list has been reviewed and updated.  Current Outpatient Medications on File Prior to Visit  Medication Sig Dispense Refill  . acetaminophen (TYLENOL) 500 MG tablet Take 500 mg by mouth every 6 (six) hours as needed for mild pain.    Marland Kitchen aspirin 81 MG tablet Take 81 mg by mouth daily.    Marland Kitchen atorvastatin (LIPITOR) 20 MG tablet TAKE 1 TABLET (20 MG TOTAL) BY MOUTH DAILY. 90 tablet 3  . b complex vitamins tablet Take 1 tablet by mouth daily.    . Calcium-Phosphorus-Vitamin D (CITRACAL +D3 PO) Take 2 tablets by mouth 2 (two) times daily.    . Cholecalciferol (VITAMIN D-3) 5000 UNITS TABS Take by mouth daily. Reported on 02/24/2015    . Glycerin-Hypromellose-PEG 400 ([facility] DRY EYE RELIEF OP) Apply 2 drops to eye daily.    Marland Kitchen guaiFENesin (ROBITUSSIN) 100 MG/5ML liquid Take 200 mg by mouth as needed.     Marland Kitchen levothyroxine (SYNTHROID, LEVOTHROID) 100 MCG tablet Take 1 tablet (100 mcg total) by mouth daily. 90 tablet 3  . losartan-hydrochlorothiazide (HYZAAR) 50-12.5 MG tablet Take 1 tablet by mouth daily. 90 tablet 3  . metFORMIN (GLUCOPHAGE) 500 MG tablet Take 1 daily 90 tablet 3   No current facility-administered medications on file prior to visit.     Review of Systems:  As per HPI- otherwise negative. No fever or chills She had noted some mucus in her throat earlier this year, but it seems to have improved as the year went on  BP Readings from Last 3 Encounters:  07/23/17 (!) 166/84  03/21/17 (!) 142/85  09/20/16 128/66      Physical Examination: Vitals:   07/23/17 0902  BP: (!) 166/84  Pulse: 80  Resp: 16  SpO2: 97%   Vitals:   07/23/17 0902  Weight: 228 lb (103.4 kg)  Height: 5' 5.5" (1.664 m)   Body mass index is 37.36 kg/m. Ideal Body Weight: Weight in (lb) to have BMI = 25: 152.2  GEN: WDWN, NAD, Non-toxic, A & O x  3, obese, looks well  HEENT: Atraumatic, Normocephalic. Neck supple. No masses, No LAD. Ears and Nose: No external deformity. CV: RRR, No M/G/R. No JVD. No thrill. No extra heart sounds. PULM: CTA B, no wheezes, crackles, rhonchi. No retractions. No resp. distress. No accessory muscle use. ABD: S, NT, ND EXTR: No c/c/e NEURO Normal gait.  PSYCH: Normally interactive. Conversant. Not depressed or anxious  appearing.  Calm demeanor.   Repeat BP better today  Assessment and Plan: Macrocytic anemia - Plan: CBC, B12, Folate  Controlled type 2 diabetes mellitus without complication, without long-term current use of insulin ([facility]) - Plan: Hemoglobin A1c  Essential hypertension  Postoperative hypothyroidism  Gastroesophageal reflux disease without esophagitis - Plan: famotidine (PEPCID) 20 MG tablet  Will check labs today to eval her MCV, check B12 and folate levels  Her BP is under good control at home per their report- they will continue to monitor this Refilled pepcid for her today Will plan further follow- up pending labs.   Signed Lamar Blinks, MD 6/11 Received her labs , letter to pt   Results for orders placed or performed in visit on 07/23/17  CBC  Result Value Ref Range   WBC 4.6 4.0 - 10.5 K/uL   RBC 3.90 3.87 - 5.11 Mil/uL   Platelets 227.0 150.0 - 400.0 K/uL   Hemoglobin 13.2 12.0 - 15.0 g/dL   HCT 38.5 36.0 - 46.0 %   MCV 98.8 78.0 - 100.0 fl   MCHC 34.2 30.0 - 36.0 g/dL   RDW 12.9 11.5 - 15.5 %  Hemoglobin A1c  Result Value Ref Range   Hgb A1c MFr Bld 7.0 (H) 4.6 - 6.5 %  B12  Result Value Ref Range   Vitamin B-12 227 211 - 911 pg/mL  Folate  Result Value Ref Range   Folate >24.1 >5.9 ng/mL

## 2017-07-23 ENCOUNTER — Encounter: Payer: Self-pay | Admitting: Family Medicine

## 2017-07-23 ENCOUNTER — Ambulatory Visit (INDEPENDENT_AMBULATORY_CARE_PROVIDER_SITE_OTHER): Payer: Medicare Other | Admitting: Family Medicine

## 2017-07-23 VITALS — BP 150/85 | HR 80 | Resp 16 | Ht 65.5 in | Wt 228.0 lb

## 2017-07-23 DIAGNOSIS — I10 Essential (primary) hypertension: Secondary | ICD-10-CM

## 2017-07-23 DIAGNOSIS — D539 Nutritional anemia, unspecified: Secondary | ICD-10-CM | POA: Diagnosis not present

## 2017-07-23 DIAGNOSIS — E119 Type 2 diabetes mellitus without complications: Secondary | ICD-10-CM

## 2017-07-23 DIAGNOSIS — K219 Gastro-esophageal reflux disease without esophagitis: Secondary | ICD-10-CM | POA: Diagnosis not present

## 2017-07-23 DIAGNOSIS — E890 Postprocedural hypothyroidism: Secondary | ICD-10-CM

## 2017-07-23 LAB — CBC
HEMATOCRIT: 38.5 % (ref 36.0–46.0)
Hemoglobin: 13.2 g/dL (ref 12.0–15.0)
MCHC: 34.2 g/dL (ref 30.0–36.0)
MCV: 98.8 fl (ref 78.0–100.0)
Platelets: 227 10*3/uL (ref 150.0–400.0)
RBC: 3.9 Mil/uL (ref 3.87–5.11)
RDW: 12.9 % (ref 11.5–15.5)
WBC: 4.6 10*3/uL (ref 4.0–10.5)

## 2017-07-23 LAB — VITAMIN B12: Vitamin B-12: 227 pg/mL (ref 211–911)

## 2017-07-23 LAB — HEMOGLOBIN A1C: HEMOGLOBIN A1C: 7 % — AB (ref 4.6–6.5)

## 2017-07-23 LAB — FOLATE: Folate: >24.1 ng/mL (ref >5.9)

## 2017-07-23 MED ORDER — FAMOTIDINE 20 MG PO TABS: 10.0000 mg | ORAL_TABLET | Freq: Every day | ORAL | 3 refills | Status: DC

## 2017-07-23 NOTE — Patient Instructions (Signed)
It was great to see you again today! Take care and I will be in touch with your labs.  Assuming all is ok, we can plan to visit in about 6 months. Please ask your eye doctor to send me a copy of your eye exam later this summer  Please continue to monitor your BP at home and let me know if running higher than 140/90 on a regular basis

## 2017-09-19 NOTE — Progress Notes (Signed)
Subjective:   Anne [name] is a 77 y.o. female who presents for Medicare Annual (Subsequent) preventive examination.  Review of Systems: No ROS.  Medicare Wellness Visit. Additional risk factors are reflected in the social history. Cardiac Risk Factors include: advanced age (>51men, >62 women);dyslipidemia;hypertension;diabetes mellitus;obesity (BMI >30kg/m2);sedentary lifestyle Sleep patterns: Sleeps 6-8 hrs nightly. Naps daily Home Safety/Smoke Alarms: Feels safe in home. Smoke alarms in place.  Living environment; residence and Firearm Safety: Lives in 1 story home with husband   Female:        Mammo-utd       Dexa scan-utd        CCS- cologuard 04/04/16-neg Eye-yearly per pt. Dr.Tanner.    Objective:     Vitals: BP (!) 155/85 (BP Location: Left Arm, Patient Position: Sitting, Cuff Size: Normal)   Pulse 68   Ht 5\' 6"  (1.676 m)   Wt 228 lb 3.2 oz (103.5 kg)   SpO2 97%   BMI 36.83 kg/m   Body mass index is 36.83 kg/m.  Advanced Directives 09/21/2017 02/15/2015  Does Patient Have a Medical Advance Directive? No No  Would patient like information on creating a medical advance directive? No - Patient declined -    Tobacco Social History   Tobacco Use  Smoking Status Never Smoker  Smokeless Tobacco Never Used     Counseling given: Not Answered   Clinical Intake: Pain : No/denies pain   Past Medical History:  Diagnosis Date  . Arthritis   . Cataract   . Comedone   . Hypertension   . Thyroid disease    thyroidectomy in 2011   Past Surgical History:  Procedure Laterality Date  . CATARACT EXTRACTION    . EYE SURGERY    . JOINT REPLACEMENT    . THYROIDECTOMY  12/25/2009  . TONSILLECTOMY AND ADENOIDECTOMY    . 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 TOTAL HIP ARTHROPLASTY Right 06/03/2008   Family History  Problem Relation Age of Onset  . Stroke Mother   . Cancer Father   . Cancer Sister   . Cancer Sister   . Cancer Sister   . Miscarriages /  Stillbirths Neg Hx    Social History   Socioeconomic History  . Marital status: Married    Spouse name: Juanda Crumble  . Number of children: 1  . Years of education: 29  . Highest education level: Not on file  Occupational History    Employer: RETIRED  Social Needs  . Financial resource strain: Not on file  . Food insecurity:    Worry: Not on file    Inability: Not on file  . Transportation needs:    Medical: Not on file    Non-medical: Not on file  Tobacco Use  . Smoking status: Never Smoker  . Smokeless tobacco: Never Used  Substance and Sexual Activity  . Alcohol use: No  . Drug use: No  . Sexual activity: Yes  Lifestyle  . Physical activity:    Days per week: Not on file    Minutes per session: Not on file  . Stress: Not on file  Relationships  . Social connections:    Talks on phone: Not on file    Gets together: Not on file    Attends religious service: Not on file    Active member of club or organization: Not on file    Attends meetings of clubs or organizations: Not on file    Relationship status: Not on file  Other  Topics Concern  . Not on file  Social History Narrative  . Not on file    Outpatient Encounter Medications as of 09/21/2017  Medication Sig  . acetaminophen (TYLENOL) 500 MG tablet Take 500 mg by mouth every 6 (six) hours as needed for mild pain.  Marland Kitchen aspirin 81 MG tablet Take 81 mg by mouth daily.  Marland Kitchen atorvastatin (LIPITOR) 20 MG tablet TAKE 1 TABLET (20 MG TOTAL) BY MOUTH DAILY.  Marland Kitchen b complex vitamins tablet Take 1 tablet by mouth daily.  . Calcium-Phosphorus-Vitamin D (CITRACAL +D3 PO) Take 2 tablets by mouth 2 (two) times daily.  . Cholecalciferol (VITAMIN D-3) 5000 UNITS TABS Take by mouth daily. Reported on 02/24/2015  . famotidine (PEPCID) 20 MG tablet Take 0.5 tablets (10 mg total) by mouth daily.  . Glycerin-Hypromellose-PEG 400 ([facility] DRY EYE RELIEF OP) Apply 2 drops to eye daily.  Marland Kitchen guaiFENesin (ROBITUSSIN) 100 MG/5ML liquid Take 200 mg by mouth  as needed.   Marland Kitchen levothyroxine (SYNTHROID, LEVOTHROID) 100 MCG tablet Take 1 tablet (100 mcg total) by mouth daily.  Marland Kitchen losartan-hydrochlorothiazide (HYZAAR) 50-12.5 MG tablet Take 1 tablet by mouth daily.  . metFORMIN (GLUCOPHAGE) 500 MG tablet Take 1 daily   No facility-administered encounter medications on file as of 09/21/2017.     Activities of Daily Living In your present state of health, do you have any difficulty performing the following activities: 09/21/2017  Hearing? N  Vision? N  Difficulty concentrating or making decisions? N  Walking or climbing stairs? Y  Comment can do but uses cane.  Dressing or bathing? N  Doing errands, shopping? N  Preparing Food and eating ? N  Using the Toilet? N  In the past six months, have you accidently leaked urine? N  Do you have problems with loss of bowel control? N  Managing your Medications? N  Managing your Finances? N  Housekeeping or managing your Housekeeping? N  Some recent data might be hidden    Patient Care Team: [name], Gay Filler, MD as PCP - General (Family Medicine)    Assessment:   This is a routine wellness examination for Anne [name]. Physical assessment deferred to PCP.  Exercise Activities and Dietary recommendations Current Exercise Habits: The patient does not participate in regular exercise at present, Exercise limited by: None identified Diet (meal preparation, eat out, water intake, caffeinated beverages, dairy products, fruits and vegetables): well balanced, on average, 3 meals per day     Goals    . Weight (lb) < 205 lb (93 kg) (pt-stated)     Eat healthy and exercise at least 10 min every day.       Fall Risk Fall Risk  09/21/2017 09/20/2016 11/10/2015 07/08/2015 03/10/2015  Falls in the past year? No No No No No    Depression Screen PHQ 2/9 Scores 09/21/2017 09/20/2016 11/10/2015 07/08/2015  PHQ - 2 Score 0 0 0 0     Cognitive Function MMSE - Mini Mental State Exam 09/21/2017  Orientation to time 5  Orientation  to Place 5  Registration 3  Attention/ Calculation 5  Recall 3  Language- name 2 objects 2  Language- repeat 0  Language- follow 3 step command 3  Language- read & follow direction 1  Write a sentence 1  Copy design 1  Total score 29        Immunization History  Administered Date(s) Administered  . Influenza Split 01/04/2012  . Influenza, High Dose Seasonal PF 11/10/2015  . Influenza,inj,Quad PF,6+ Mos 01/22/2014  .  Influenza-Unspecified 12/14/2012, 12/18/2014  . Pneumococcal Conjugate-13 01/22/2014  . Pneumococcal Polysaccharide-23 01/04/2012  . Td 01/22/2014    Screening Tests Health Maintenance  Topic Date Due  . OPHTHALMOLOGY EXAM  06/13/2017  . INFLUENZA VACCINE  09/13/2017  . HEMOGLOBIN A1C  01/22/2018  . FOOT EXAM  03/21/2018  . TETANUS/TDAP  01/23/2024  . DEXA SCAN  Completed  . PNA vac Low Risk Adult  Completed       Plan:    Please schedule your next medicare wellness visit with me in 1 yr.  Continue to eat heart healthy diet (full of fruits, vegetables, whole grains, lean protein, water--limit salt, fat, and sugar intake) and increase physical activity as tolerated.  Continue doing brain stimulating activities (puzzles, reading, adult coloring books, staying active) to keep memory sharp.   Bring a copy of your living will and/or healthcare power of attorney to your next office visit.    I have personally reviewed and noted the following in the patient's chart:   . Medical and social history . Use of alcohol, tobacco or illicit drugs  . Current medications and supplements . Functional ability and status . Nutritional status . Physical activity . Advanced directives . List of other physicians . Hospitalizations, surgeries, and ER visits in previous 12 months . Vitals . Screenings to include cognitive, depression, and falls . Referrals and appointments  In addition, I have reviewed and discussed with patient certain preventive protocols,  quality metrics, and best practice recommendations. A written personalized care plan for preventive services as well as general preventive health recommendations were provided to patient.     Shela Nevin, [place]  09/21/2017

## 2017-09-21 ENCOUNTER — Encounter: Payer: Self-pay | Admitting: *Deleted

## 2017-09-21 ENCOUNTER — Ambulatory Visit (INDEPENDENT_AMBULATORY_CARE_PROVIDER_SITE_OTHER): Payer: Medicare Other | Admitting: *Deleted

## 2017-09-21 VITALS — BP 155/85 | HR 68 | Ht 66.0 in | Wt 228.2 lb

## 2017-09-21 DIAGNOSIS — Z0000 Encounter for general adult medical examination without abnormal findings: Secondary | ICD-10-CM

## 2017-09-21 NOTE — Patient Instructions (Signed)
Please schedule your next medicare wellness visit with me in 1 yr.  Continue to eat heart healthy diet (full of fruits, vegetables, whole grains, lean protein, water--limit salt, fat, and sugar intake) and increase physical activity as tolerated.  Continue doing brain stimulating activities (puzzles, reading, adult coloring books, staying active) to keep memory sharp.   Bring a copy of your living will and/or healthcare power of attorney to your next office visit.   Anne [name] , Thank you for taking time to come for your Medicare Wellness Visit. I appreciate your ongoing commitment to your health goals. Please review the following plan we discussed and let me know if I can assist you in the future.   These are the goals we discussed: Goals    . Weight (lb) < 205 lb (93 kg) (pt-stated)     Eat healthy and exercise at least 10 min every day.       This is a list of the screening recommended for you and due dates:  Health Maintenance  Topic Date Due  . Eye exam for diabetics  06/13/2017  . Flu Shot  09/13/2017  . Hemoglobin A1C  01/22/2018  . Complete foot exam   03/21/2018  . Tetanus Vaccine  01/23/2024  . DEXA scan (bone density measurement)  Completed  . Pneumonia vaccines  Completed

## 2017-09-21 NOTE — Progress Notes (Signed)
Noted. Agree with above.  [place], DO 09/21/17 10:56 AM

## 2017-09-24 ENCOUNTER — Ambulatory Visit: Payer: Medicare Other | Admitting: *Deleted

## 2017-12-10 ENCOUNTER — Encounter: Payer: Self-pay | Admitting: Family Medicine

## 2017-12-10 DIAGNOSIS — H524 Presbyopia: Secondary | ICD-10-CM | POA: Diagnosis not present

## 2017-12-10 DIAGNOSIS — H26492 Other secondary cataract, left eye: Secondary | ICD-10-CM | POA: Diagnosis not present

## 2017-12-10 DIAGNOSIS — H25011 Cortical age-related cataract, right eye: Secondary | ICD-10-CM | POA: Diagnosis not present

## 2017-12-10 DIAGNOSIS — H43813 Vitreous degeneration, bilateral: Secondary | ICD-10-CM | POA: Diagnosis not present

## 2017-12-10 LAB — HM DIABETES EYE EXAM

## 2017-12-15 DIAGNOSIS — Z23 Encounter for immunization: Secondary | ICD-10-CM | POA: Diagnosis not present

## 2017-12-27 DIAGNOSIS — H26492 Other secondary cataract, left eye: Secondary | ICD-10-CM | POA: Diagnosis not present

## 2018-01-20 NOTE — Progress Notes (Addendum)
[facility] [address], [place], [place] [address] [phone]  Date:  01/23/2018   Name:  Anne [name]   DOB:  Aug 05, 1940   MRN:  [id number]  PCP:  Darreld Mclean, MD    Chief Complaint: Diabetes (6 month follow up)   History of Present Illness:  Anne [name] is a 77 y.o. very pleasant female patient who presents with the following:  Periodic follow-up visit today History of DM, hypothyroidism s/p thyroidectomy 2011 for thyroid cancer, hyperlipidemia, HTN  Never a smoker Last seen here in June at which time she was doing well, DM under fine control  Accompanied by her husband who contributes to the history today.  Lab Results  Component Value Date   HGBA1C 7.0 (H) 07/23/2017   Her grandson Harrell Gave has suffered from meningioma.  At last visit he was doing well.  He is getting an MRI this month. He is attending school and doing ok.  Her young grand-daughter is doing well and is now 18 months. Labs today- she is fasting today  Shingrix: discussed with her today   She has not noted any hypoglycemia She does not really check her glucose  Lab Results  Component Value Date   TSH 3.20 04/25/2017   She has put on just a few lbs She plans to work on getting these pounds back off.  Wt Readings from Last 3 Encounters:  01/23/18 228 lb (103.4 kg)  09/21/17 228 lb 3.2 oz (103.5 kg)  07/23/17 228 lb (103.4 kg)   She denies any real concerns today otherwise   Patient Active Problem List   Diagnosis Date Noted  . Controlled type 2 diabetes mellitus without complication, without long-term current use of insulin ([place]) 03/31/2015  . Chest pressure 03/03/2013  . Globus sensation 03/03/2013  . DOE (dyspnea on exertion) 03/03/2013  . Thyroid cancer ([place]) 01/05/2012  . Hyperlipidemia 01/05/2012  . Dysphonia 01/05/2012  . 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 Hypertension 01/04/2012  . Hypothyroid  01/04/2012    Past Medical History:  Diagnosis Date  . Arthritis   . Cataract   . Comedone   . Hypertension   . Thyroid disease    thyroidectomy in 2011    Past Surgical History:  Procedure Laterality Date  . CATARACT EXTRACTION    . EYE SURGERY    . JOINT REPLACEMENT    . THYROIDECTOMY  12/25/2009  . TONSILLECTOMY AND ADENOIDECTOMY    . 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 TOTAL HIP ARTHROPLASTY Right 06/03/2008    Social History   Tobacco Use  . Smoking status: Never Smoker  . Smokeless tobacco: Never Used  Substance Use Topics  . Alcohol use: No  . Drug use: No    Family History  Problem Relation Age of Onset  . Stroke Mother   . Cancer Father   . Cancer Sister   . Cancer Sister   . Cancer Sister   . Miscarriages / Stillbirths Neg Hx     No Known Allergies  Medication list has been reviewed and updated.  Current Outpatient Medications on File Prior to Visit  Medication Sig Dispense Refill  . acetaminophen (TYLENOL) 500 MG tablet Take 500 mg by mouth every 6 (six) hours as needed for mild pain.    Marland Kitchen aspirin 81 MG tablet Take 81 mg by mouth daily.    Marland Kitchen atorvastatin (LIPITOR) 20 MG tablet TAKE 1 TABLET (  20 MG TOTAL) BY MOUTH DAILY. 90 tablet 3  . b complex vitamins tablet Take 1 tablet by mouth daily.    . Calcium-Phosphorus-Vitamin D (CITRACAL +D3 PO) Take 2 tablets by mouth 2 (two) times daily.    . Cholecalciferol (VITAMIN D-3) 5000 UNITS TABS Take by mouth daily. Reported on 02/24/2015    . famotidine (PEPCID) 20 MG tablet Take 0.5 tablets (10 mg total) by mouth daily. 45 tablet 3  . Glycerin-Hypromellose-PEG 400 ([facility] DRY EYE RELIEF OP) Apply 2 drops to eye daily.    Marland Kitchen guaiFENesin (ROBITUSSIN) 100 MG/5ML liquid Take 200 mg by mouth as needed.     Marland Kitchen levothyroxine (SYNTHROID, LEVOTHROID) 100 MCG tablet Take 1 tablet (100 mcg total) by mouth daily. 90 tablet 3  . losartan-hydrochlorothiazide (HYZAAR) 50-12.5 MG tablet Take 1 tablet by mouth daily. 90  tablet 3  . metFORMIN (GLUCOPHAGE) 500 MG tablet Take 1 daily 90 tablet 3   No current facility-administered medications on file prior to visit.     Review of Systems:  As per HPI- otherwise negative.   Physical Examination: Vitals:   01/23/18 0903  BP: 132/80  Pulse: 82  Resp: 18  Temp: 97.9 F (36.6 C)  SpO2: 98%   Vitals:   01/23/18 0903  Weight: 228 lb (103.4 kg)  Height: 5\' 6"  (1.676 m)   Body mass index is 36.8 kg/m. Ideal Body Weight: Weight in (lb) to have BMI = 25: 154.6  GEN: WDWN, NAD, Non-toxic, A & O x 3, obese, looks well  HEENT: Atraumatic, Normocephalic. Neck supple. No masses, No LAD. Ears and Nose: No external deformity. CV: RRR, No M/G/R. No JVD. No thrill. No extra heart sounds. PULM: CTA B, no wheezes, crackles, rhonchi. No retractions. No resp. distress. No accessory muscle use. EXTR: No c/c/e NEURO Normal gait for patient, uses a cane. PSYCH: Normally interactive. Conversant. Not depressed or anxious appearing.  Calm demeanor.    Assessment and Plan: Controlled type 2 diabetes mellitus without complication, without long-term current use of insulin ([place]) - Plan: Comprehensive metabolic panel, Hemoglobin A1c  Mixed hyperlipidemia - Plan: Lipid panel  Essential hypertension - Plan: Comprehensive metabolic panel  Postoperative hypothyroidism - Plan: TSH  Periodic follow-up visit today.  We will be in touch with her labs ASAP.  No real concerns today, she will contact me if any problems prior to our plan 70-month follow-up.  Did encourage her to have this Shingrix vaccine at her convenience.  Signed Lamar Blinks, MD  Received her labs 12/12 Called pt to go over results Did not reach her Will try back as we need to adjust her thyroid med   Results for orders placed or performed in visit on 01/23/18  Comprehensive metabolic panel  Result Value Ref Range   Sodium 140 135 - 145 mEq/L   Potassium 4.0 3.5 - 5.1 mEq/L   Chloride 103 96 -  112 mEq/L   CO2 28 19 - 32 mEq/L   Glucose, Bld 142 (H) 70 - 99 mg/dL   BUN 17 6 - 23 mg/dL   Creatinine, Ser 0.82 0.40 - 1.20 mg/dL   Total Bilirubin 0.6 0.2 - 1.2 mg/dL   Alkaline Phosphatase 71 39 - 117 U/L   AST 24 0 - 37 U/L   ALT 23 0 - 35 U/L   Total Protein 7.3 6.0 - 8.3 g/dL   Albumin 4.3 3.5 - 5.2 g/dL   Calcium 9.9 8.4 - 10.5 mg/dL   GFR 86.75 >60.00  mL/min  Hemoglobin A1c  Result Value Ref Range   Hgb A1c MFr Bld 7.1 (H) 4.6 - 6.5 %  Lipid panel  Result Value Ref Range   Cholesterol 164 0 - 200 mg/dL   Triglycerides 178.0 (H) 0.0 - 149.0 mg/dL   HDL 50.10 >39.00 mg/dL   VLDL 35.6 0.0 - 40.0 mg/dL   LDL Cholesterol 78 0 - 99 mg/dL   Total CHOL/HDL Ratio 3    NonHDL 113.52   TSH  Result Value Ref Range   TSH 7.52 (H) 0.35 - 4.50 uIU/mL    Called and was able to reach her- she is taking 14mcg of levothyroxine.  Will increase to 112.  She will come in for a TSH in about one month   Letter to pt with other lab details

## 2018-01-23 ENCOUNTER — Encounter: Payer: Self-pay | Admitting: Family Medicine

## 2018-01-23 ENCOUNTER — Ambulatory Visit (INDEPENDENT_AMBULATORY_CARE_PROVIDER_SITE_OTHER): Payer: Medicare Other | Admitting: Family Medicine

## 2018-01-23 VITALS — BP 132/80 | HR 82 | Temp 97.9°F | Resp 18 | Ht 66.0 in | Wt 228.0 lb

## 2018-01-23 DIAGNOSIS — E890 Postprocedural hypothyroidism: Secondary | ICD-10-CM

## 2018-01-23 DIAGNOSIS — I10 Essential (primary) hypertension: Secondary | ICD-10-CM | POA: Diagnosis not present

## 2018-01-23 DIAGNOSIS — E782 Mixed hyperlipidemia: Secondary | ICD-10-CM | POA: Diagnosis not present

## 2018-01-23 DIAGNOSIS — E119 Type 2 diabetes mellitus without complications: Secondary | ICD-10-CM | POA: Diagnosis not present

## 2018-01-23 LAB — COMPREHENSIVE METABOLIC PANEL
ALT: 23 U/L (ref 0–35)
AST: 24 U/L (ref 0–37)
Albumin: 4.3 g/dL (ref 3.5–5.2)
Alkaline Phosphatase: 71 U/L (ref 39–117)
BILIRUBIN TOTAL: 0.6 mg/dL (ref 0.2–1.2)
BUN: 17 mg/dL (ref 6–23)
CALCIUM: 9.9 mg/dL (ref 8.4–10.5)
CO2: 28 mEq/L (ref 19–32)
Chloride: 103 mEq/L (ref 96–112)
Creatinine, Ser: 0.82 mg/dL (ref 0.40–1.20)
GFR: 86.75 mL/min (ref >60.00)
Glucose, Bld: 142 mg/dL — ABNORMAL HIGH (ref 70–99)
POTASSIUM: 4 meq/L (ref 3.5–5.1)
SODIUM: 140 meq/L (ref 135–145)
Total Protein: 7.3 g/dL (ref 6.0–8.3)

## 2018-01-23 LAB — LIPID PANEL
Cholesterol: 164 mg/dL (ref 0–200)
HDL: 50.1 mg/dL (ref >39.00)
LDL Cholesterol: 78 mg/dL (ref 0–99)
NonHDL: 113.52
TRIGLYCERIDES: 178 mg/dL — AB (ref 0.0–149.0)
Total CHOL/HDL Ratio: 3
VLDL: 35.6 mg/dL (ref 0.0–40.0)

## 2018-01-23 LAB — TSH: TSH: 7.52 u[IU]/mL — ABNORMAL HIGH (ref 0.35–4.50)

## 2018-01-23 LAB — HEMOGLOBIN A1C: HEMOGLOBIN A1C: 7.1 % — AB (ref 4.6–6.5)

## 2018-01-23 NOTE — Patient Instructions (Addendum)
I will be in touch with your labs asap  Always a pleasure to see you, have a wonderful holiday season. Consider having the shingles vaccine done at your drugstore at your convenience.  This is a 2 shot series, with the 2 vaccines taken 2 to 6 months apart. Assuming all of your labs are normal, we can plan to check back in 6 months

## 2018-01-29 MED ORDER — LEVOTHYROXINE SODIUM 112 MCG PO TABS: 112.0000 ug | ORAL_TABLET | Freq: Every day | ORAL | 6 refills | Status: DC

## 2018-01-29 MED ORDER — LOSARTAN POTASSIUM-HCTZ 50-12.5 MG PO TABS: 1.0000 | ORAL_TABLET | Freq: Every day | ORAL | 3 refills | Status: DC

## 2018-01-29 NOTE — Addendum Note (Signed)
Addended by: Lamar Blinks C on: 01/29/2018 05:49 PM   Modules accepted: Orders

## 2018-01-30 ENCOUNTER — Telehealth: Payer: Self-pay

## 2018-01-30 NOTE — Telephone Encounter (Signed)
Copied from [place] [id number]. Topic: General - Other >> Jan 29, 2018 11:27 AM Oneta Rack wrote: Relation to pt: self  Call back number: [phone]  Reason for call:  Patient inquiring about lab results, best # [phone] (H)

## 2018-01-30 NOTE — Telephone Encounter (Signed)
Dr. Lorelei Pont spoke to patient on the phone regarding results. Letter has also been sent to patient.

## 2018-02-28 ENCOUNTER — Other Ambulatory Visit (INDEPENDENT_AMBULATORY_CARE_PROVIDER_SITE_OTHER): Payer: Medicare Other

## 2018-02-28 ENCOUNTER — Telehealth: Payer: Self-pay | Admitting: Family Medicine

## 2018-02-28 DIAGNOSIS — E890 Postprocedural hypothyroidism: Secondary | ICD-10-CM | POA: Diagnosis not present

## 2018-02-28 LAB — TSH: TSH: 3.35 u[IU]/mL (ref 0.35–4.50)

## 2018-02-28 NOTE — Telephone Encounter (Signed)
Results for orders placed or performed in visit on 02/28/18  TSH  Result Value Ref Range   TSH 3.35 0.35 - 4.50 uIU/mL   Thyroid is now normal range, called to give results but no answer and no machine.  Will send letter

## 2018-03-21 ENCOUNTER — Other Ambulatory Visit: Payer: Self-pay | Admitting: Family Medicine

## 2018-03-21 DIAGNOSIS — E782 Mixed hyperlipidemia: Secondary | ICD-10-CM

## 2018-03-21 DIAGNOSIS — E119 Type 2 diabetes mellitus without complications: Secondary | ICD-10-CM

## 2018-06-29 ENCOUNTER — Other Ambulatory Visit: Payer: Self-pay | Admitting: Family Medicine

## 2018-06-29 DIAGNOSIS — K219 Gastro-esophageal reflux disease without esophagitis: Secondary | ICD-10-CM

## 2018-07-18 ENCOUNTER — Other Ambulatory Visit: Payer: Self-pay | Admitting: Family Medicine

## 2018-07-18 DIAGNOSIS — Z9289 Personal history of other medical treatment: Secondary | ICD-10-CM

## 2018-07-23 NOTE — Patient Instructions (Addendum)
It was great to see you today, as always.  I will be in touch with your labs ASAP I will be thinking of your family and praying that Anne [name] gets a good report from his doctor.  I am so sorry that he has to go through this cancer  Please consider getting the Shingrix vaccine series prevent shingles.  This can be given at your drugstore  Please help me remember, you will be due to repeat Cologuard screening for colon cancer next year!   Assuming all is well let's visit in 6 months

## 2018-07-23 NOTE — Progress Notes (Addendum)
[place] at [facility] [address] [place], [place] [address] [phone]  Date:  07/25/2018   Name:  Anne [name]   DOB:  06-Dec-1940   MRN:  [id number]  PCP:  Darreld Mclean, MD    Chief Complaint: Diabetes (6 month follow up) and Knee Pain (right knee, arthritis, worse with stretching, redness in foot)   History of Present Illness:  Anne [name] is a 78 y.o. very pleasant female patient who presents with the following:  Here today for periodic follow-up visit And he has history of diabetes, hyperlipidemia, hypertension, hypothyroidism status post thyroidectomy for cancer-thyroidectomy in 2011 And he is married to Anne [name].  They had some difficulty in her family, as their 73 yo grandson Harrell Gave has had a brain meningioma.  At her last visit in December he was doing well and attending school.  She also has a 37-year-old granddaughter I am very sorry to hear that at [facility]' most recent MRI it appears that his cancer is coming back, they are probably going to do chemo/ radiation treatment for him coming up. They are waiting to hear more details from his team   Aspirin Lipitor Pepcid Synthroid 112 Losartan HCTZ Metformin 500 daily  Due today for foot exam, and A1c Also suggest Shingrix DEXA scan last year Mammogram is due-done February 2019, this has been rescheduled to July Screening for colon cancer: She did Cologuard in February 2018, will be due next year  She had a CMP, lipid in December A1c in December looked fine TSH elevated in December, we adjusted her thyroid medication from 100 212 mcg and was back in range in January  Lab Results  Component Value Date   TSH 3.35 02/28/2018   Lab Results  Component Value Date   HGBA1C 7.1 (H) 01/23/2018   She notes some aches and pains off and on but nothing that is consistent or worrisome to her  She may have some knee pain when in bed at night. More the right  knee.  Laying on her side will help  She will take tylenol as needed   Patient Active Problem List   Diagnosis Date Noted  . Controlled type 2 diabetes mellitus without complication, without long-term current use of insulin ([place]) 03/31/2015  . Chest pressure 03/03/2013  . Globus sensation 03/03/2013  . DOE (dyspnea on exertion) 03/03/2013  . Thyroid cancer ([place]) 01/05/2012  . Hyperlipidemia 01/05/2012  . Dysphonia 01/05/2012  . 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 Hypertension 01/04/2012  . Hypothyroid 01/04/2012    Past Medical History:  Diagnosis Date  . Arthritis   . Cataract   . Comedone   . Hypertension   . Thyroid disease    thyroidectomy in 2011    Past Surgical History:  Procedure Laterality Date  . CATARACT EXTRACTION    . EYE SURGERY    . JOINT REPLACEMENT    . THYROIDECTOMY  12/25/2009  . TONSILLECTOMY AND ADENOIDECTOMY    . 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 TOTAL HIP ARTHROPLASTY Right 06/03/2008    Social History   Tobacco Use  . Smoking status: Never Smoker  . Smokeless tobacco: Never Used  Substance Use Topics  . Alcohol use: No  . Drug use: No    Family History  Problem Relation Age of Onset  . Stroke Mother   . Cancer Father   . Cancer Sister   . Cancer Sister   .  Cancer Sister   . Miscarriages / Stillbirths Neg Hx     No Known Allergies  Medication list has been reviewed and updated.  Current Outpatient Medications on File Prior to Visit  Medication Sig Dispense Refill  . acetaminophen (TYLENOL) 500 MG tablet Take 500 mg by mouth every 6 (six) hours as needed for mild pain.    Marland Kitchen aspirin 81 MG tablet Take 81 mg by mouth daily.    Marland Kitchen atorvastatin (LIPITOR) 20 MG tablet TAKE 1 TABLET(20 MG) BY MOUTH DAILY 90 tablet 3  . b complex vitamins tablet Take 1 tablet by mouth daily.    . Calcium-Phosphorus-Vitamin D (CITRACAL +D3 PO) Take 2 tablets by mouth 2 (two) times daily.    . Cholecalciferol (VITAMIN D-3) 5000 UNITS TABS  Take by mouth daily. Reported on 02/24/2015    . famotidine (PEPCID) 20 MG tablet TAKE 1/2 TABLET(10 MG) BY MOUTH DAILY 45 tablet 3  . Glycerin-Hypromellose-PEG 400 ([facility] DRY EYE RELIEF OP) Apply 2 drops to eye daily.    Marland Kitchen guaiFENesin (ROBITUSSIN) 100 MG/5ML liquid Take 200 mg by mouth as needed.     Marland Kitchen levothyroxine (SYNTHROID, LEVOTHROID) 112 MCG tablet Take 1 tablet (112 mcg total) by mouth daily. 30 tablet 6  . losartan-hydrochlorothiazide (HYZAAR) 50-12.5 MG tablet Take 1 tablet by mouth daily. 90 tablet 3  . metFORMIN (GLUCOPHAGE) 500 MG tablet TAKE 1 TABLET BY MOUTH DAILY 90 tablet 3   No current facility-administered medications on file prior to visit.     Review of Systems:  As per HPI- otherwise negative. No fever or chills  Physical Examination: Vitals:   07/25/18 1027  BP: (!) 150/82  Pulse: 85  Resp: 18  Temp: 97.7 F (36.5 C)  SpO2: 97%   Vitals:   07/25/18 1027  Weight: 223 lb (101.2 kg)  Height: 6' (1.829 m)   Body mass index is 30.24 kg/m. Ideal Body Weight: Weight in (lb) to have BMI = 25: 183.9  GEN: WDWN, NAD, Non-toxic, A & O x 3, overweight, looks well  HEENT: Atraumatic, Normocephalic. Neck supple. No masses, No LAD. Ears and Nose: No external deformity. CV: RRR, No M/G/R. No JVD. No thrill. No extra heart sounds. PULM: CTA B, no wheezes, crackles, rhonchi. No retractions. No resp. distress. No accessory muscle use. EXTR: No c/c/e NEURO Normal gait.  PSYCH: Normally interactive. Conversant. Not depressed or anxious appearing.  Calm demeanor.  Foot exam is normal today  She did not take her BP med yet today - normally takes in the am   Assessment and Plan: Mixed hyperlipidemia  Controlled type 2 diabetes mellitus without complication, without long-term current use of insulin ([place]) - Plan: Basic metabolic panel, Hemoglobin A1c  Essential hypertension  Postoperative hypothyroidism - Plan: TSH   Follow-up: No follow-ups on file.  No orders of  the defined types were placed in this encounter.  Orders Placed This Encounter  Procedures  . Basic metabolic panel  . Hemoglobin A1c  . TSH    @SIGN @  Outpatient Encounter Medications as of 07/25/2018  Medication Sig  . acetaminophen (TYLENOL) 500 MG tablet Take 500 mg by mouth every 6 (six) hours as needed for mild pain.  Marland Kitchen aspirin 81 MG tablet Take 81 mg by mouth daily.  Marland Kitchen atorvastatin (LIPITOR) 20 MG tablet TAKE 1 TABLET(20 MG) BY MOUTH DAILY  . b complex vitamins tablet Take 1 tablet by mouth daily.  . Calcium-Phosphorus-Vitamin D (CITRACAL +D3 PO) Take 2 tablets by mouth 2 (  two) times daily.  . Cholecalciferol (VITAMIN D-3) 5000 UNITS TABS Take by mouth daily. Reported on 02/24/2015  . famotidine (PEPCID) 20 MG tablet TAKE 1/2 TABLET(10 MG) BY MOUTH DAILY  . Glycerin-Hypromellose-PEG 400 ([facility] DRY EYE RELIEF OP) Apply 2 drops to eye daily.  Marland Kitchen guaiFENesin (ROBITUSSIN) 100 MG/5ML liquid Take 200 mg by mouth as needed.   Marland Kitchen levothyroxine (SYNTHROID, LEVOTHROID) 112 MCG tablet Take 1 tablet (112 mcg total) by mouth daily.  Marland Kitchen losartan-hydrochlorothiazide (HYZAAR) 50-12.5 MG tablet Take 1 tablet by mouth daily.  . metFORMIN (GLUCOPHAGE) 500 MG tablet TAKE 1 TABLET BY MOUTH DAILY   No facility-administered encounter medications on file as of 07/25/2018.     Need to refill thyroid med once TSH comes in - she is out  Plan to visit in 6 months assuming all is well with her labs  Signed Lamar Blinks, MD  Received her labs, letter to pt .  Refilled her levothyroxine  Results for orders placed or performed in visit on 54/65/68  Basic metabolic panel  Result Value Ref Range   Sodium 142 135 - 145 mEq/L   Potassium 3.7 3.5 - 5.1 mEq/L   Chloride 102 96 - 112 mEq/L   CO2 27 19 - 32 mEq/L   Glucose, Bld 117 (H) 70 - 99 mg/dL   BUN 13 6 - 23 mg/dL   Creatinine, Ser 0.77 0.40 - 1.20 mg/dL   Calcium 9.9 8.4 - 10.5 mg/dL   GFR 87.65 >60.00 mL/min  Hemoglobin A1c  Result Value Ref Range    Hgb A1c MFr Bld 7.2 (H) 4.6 - 6.5 %  TSH  Result Value Ref Range   TSH 3.98 0.35 - 4.50 uIU/mL

## 2018-07-24 ENCOUNTER — Ambulatory Visit: Payer: Medicare Other | Admitting: Family Medicine

## 2018-07-25 ENCOUNTER — Other Ambulatory Visit: Payer: Self-pay

## 2018-07-25 ENCOUNTER — Encounter: Payer: Self-pay | Admitting: Family Medicine

## 2018-07-25 ENCOUNTER — Ambulatory Visit (INDEPENDENT_AMBULATORY_CARE_PROVIDER_SITE_OTHER): Payer: Medicare Other | Admitting: Family Medicine

## 2018-07-25 VITALS — BP 140/90 | HR 85 | Temp 97.7°F | Resp 18 | Ht 72.0 in | Wt 223.0 lb

## 2018-07-25 DIAGNOSIS — I10 Essential (primary) hypertension: Secondary | ICD-10-CM

## 2018-07-25 DIAGNOSIS — E119 Type 2 diabetes mellitus without complications: Secondary | ICD-10-CM

## 2018-07-25 DIAGNOSIS — E782 Mixed hyperlipidemia: Secondary | ICD-10-CM

## 2018-07-25 DIAGNOSIS — E890 Postprocedural hypothyroidism: Secondary | ICD-10-CM | POA: Diagnosis not present

## 2018-07-25 LAB — BASIC METABOLIC PANEL
BUN: 13 mg/dL (ref 6–23)
CO2: 27 mEq/L (ref 19–32)
Calcium: 9.9 mg/dL (ref 8.4–10.5)
Chloride: 102 mEq/L (ref 96–112)
Creatinine, Ser: 0.77 mg/dL (ref 0.40–1.20)
GFR: 87.65 mL/min (ref >60.00)
Glucose, Bld: 117 mg/dL — ABNORMAL HIGH (ref 70–99)
Potassium: 3.7 mEq/L (ref 3.5–5.1)
Sodium: 142 mEq/L (ref 135–145)

## 2018-07-25 LAB — HEMOGLOBIN A1C: Hgb A1c MFr Bld: 7.2 % — ABNORMAL HIGH (ref 4.6–6.5)

## 2018-07-25 LAB — TSH: TSH: 3.98 u[IU]/mL (ref 0.35–4.50)

## 2018-07-25 MED ORDER — LEVOTHYROXINE SODIUM 112 MCG PO TABS: 112.0000 ug | ORAL_TABLET | Freq: Every day | ORAL | 3 refills | Status: DC

## 2018-07-25 NOTE — Addendum Note (Signed)
Addended by: Lamar Blinks C on: 07/25/2018 03:19 PM   Modules accepted: Orders

## 2018-08-26 ENCOUNTER — Ambulatory Visit (HOSPITAL_BASED_OUTPATIENT_CLINIC_OR_DEPARTMENT_OTHER)
Admission: RE | Admit: 2018-08-26 | Discharge: 2018-08-26 | Disposition: A | Discharge status: Home / Self Care | Payer: Medicare Other | Source: Ambulatory Visit | Attending: Family Medicine | Admitting: Family Medicine

## 2018-08-26 ENCOUNTER — Other Ambulatory Visit: Payer: Self-pay

## 2018-08-26 ENCOUNTER — Encounter: Payer: Self-pay | Admitting: Family Medicine

## 2018-08-26 ENCOUNTER — Ambulatory Visit (INDEPENDENT_AMBULATORY_CARE_PROVIDER_SITE_OTHER): Payer: Medicare Other | Admitting: Family Medicine

## 2018-08-26 ENCOUNTER — Telehealth: Payer: Self-pay

## 2018-08-26 VITALS — BP 162/86 | HR 76 | Temp 98.4°F | Resp 18 | Ht 72.0 in | Wt 223.2 lb

## 2018-08-26 DIAGNOSIS — R600 Localized edema: Secondary | ICD-10-CM | POA: Diagnosis not present

## 2018-08-26 DIAGNOSIS — M79604 Pain in right leg: Secondary | ICD-10-CM | POA: Diagnosis not present

## 2018-08-26 DIAGNOSIS — M25561 Pain in right knee: Secondary | ICD-10-CM | POA: Diagnosis not present

## 2018-08-26 MED ORDER — HYDROCHLOROTHIAZIDE 12.5 MG PO CAPS: 12.5000 mg | ORAL_CAPSULE | Freq: Every day | ORAL | 2 refills | Status: DC

## 2018-08-26 NOTE — Progress Notes (Addendum)
[place] at [facility] [address], [place], [place] [address] [phone]  Date:  08/26/2018   Name:  Anne [name]   DOB:  05-Sep-1940   MRN:  [id number]  PCP:  Darreld Mclean, MD    Chief Complaint: Edema (Pt reports swelling in both feet and legs since Friday. Pt thinks levothyroxine is making her swelling worse and has not taken it since Friday.)   History of Present Illness:  Anne [name] is a 78 y.o. very pleasant female patient who presents with the following:  History of DM, HTN, hypothyroidism, hyperlipidemia Here today with concern of swelling in her feet and legs for the last several days  She noted some swelling in both knees that seemed to travel into her feet and ankles - first noticed this 3 days ago She thought this might be due to her thyroid med so she stopped taking it for the last few days  Never had this in the past- as far as swelling She has had knee pain in the past- she saw Dr. Darryl Lent for hip pain in the past  Her 33 yo grandson Harrell Gave has a brain meningioma- right now he is on observation and is stable Her most recent TSH was in range, last A1c was reasonable  Lab Results  Component Value Date   TSH 3.98 07/25/2018   Lab Results  Component Value Date   HGBA1C 7.2 (H) 07/25/2018   lipitor pepcid Synthroid Losartan/hctz Metformin 500 daily   Wt Readings from Last 3 Encounters:  08/26/18 223 lb 3.2 oz (101.2 kg)  07/25/18 223 lb (101.2 kg)  01/23/18 228 lb (103.4 kg)   Weight is stable  No CP or SOB  She is not aware of any knee injury recently   BP Readings from Last 3 Encounters:  08/26/18 (!) 162/86  07/25/18 140/90  01/23/18 132/80    S/p bilateral hip replacement  Patient Active Problem List   Diagnosis Date Noted  . Controlled type 2 diabetes mellitus without complication, without long-term current use of insulin ([place]) 03/31/2015  . Chest pressure 03/03/2013  .  Globus sensation 03/03/2013  . DOE (dyspnea on exertion) 03/03/2013  . Thyroid cancer ([place]) 01/05/2012  . Hyperlipidemia 01/05/2012  . Dysphonia 01/05/2012  . 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 Hypertension 01/04/2012  . Hypothyroid 01/04/2012    Past Medical History:  Diagnosis Date  . Arthritis   . Cataract   . Comedone   . Hypertension   . Thyroid disease    thyroidectomy in 2011    Past Surgical History:  Procedure Laterality Date  . CATARACT EXTRACTION    . EYE SURGERY    . JOINT REPLACEMENT    . THYROIDECTOMY  12/25/2009  . TONSILLECTOMY AND ADENOIDECTOMY    . 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 TOTAL HIP ARTHROPLASTY Right 06/03/2008    Social History   Tobacco Use  . Smoking status: Never Smoker  . Smokeless tobacco: Never Used  Substance Use Topics  . Alcohol use: No  . Drug use: No    Family History  Problem Relation Age of Onset  . Stroke Mother   . Cancer Father   . Cancer Sister   . Cancer Sister   . Cancer Sister   . Miscarriages / Stillbirths Neg Hx     No Known Allergies  Medication list has been reviewed and updated.  Current Outpatient Medications on File Prior  to Visit  Medication Sig Dispense Refill  . acetaminophen (TYLENOL) 500 MG tablet Take 500 mg by mouth every 6 (six) hours as needed for mild pain.    Marland Kitchen aspirin 81 MG tablet Take 81 mg by mouth daily.    Marland Kitchen atorvastatin (LIPITOR) 20 MG tablet TAKE 1 TABLET(20 MG) BY MOUTH DAILY 90 tablet 3  . b complex vitamins tablet Take 1 tablet by mouth daily.    . Calcium-Phosphorus-Vitamin D (CITRACAL +D3 PO) Take 2 tablets by mouth 2 (two) times daily.    . Cholecalciferol (VITAMIN D-3) 5000 UNITS TABS Take by mouth daily. Reported on 02/24/2015    . famotidine (PEPCID) 20 MG tablet TAKE 1/2 TABLET(10 MG) BY MOUTH DAILY 45 tablet 3  . Glycerin-Hypromellose-PEG 400 ([facility] DRY EYE RELIEF OP) Apply 2 drops to eye daily.    Marland Kitchen levothyroxine (SYNTHROID) 112 MCG tablet Take 1 tablet  (112 mcg total) by mouth daily. 90 tablet 3  . losartan-hydrochlorothiazide (HYZAAR) 50-12.5 MG tablet Take 1 tablet by mouth daily. 90 tablet 3  . metFORMIN (GLUCOPHAGE) 500 MG tablet TAKE 1 TABLET BY MOUTH DAILY 90 tablet 3   No current facility-administered medications on file prior to visit.     Review of Systems:  As per HPI- otherwise negative. No fever or chills No CP or SOB   Physical Examination: Vitals:   08/26/18 1526  BP: (!) 162/86  Pulse: 76  Resp: 18  Temp: 98.4 F (36.9 C)  SpO2: 97%   Vitals:   08/26/18 1526  Weight: 223 lb 3.2 oz (101.2 kg)  Height: 6' (1.829 m)   Body mass index is 30.27 kg/m. Ideal Body Weight: Weight in (lb) to have BMI = 25: 183.9  GEN: WDWN, NAD, Non-toxic, A & O x 3, looks her normal self  HEENT: Atraumatic, Normocephalic. Neck supple. No masses, No LAD. Ears and Nose: No external deformity. CV: RRR, No M/G/R. No JVD. No thrill. No extra heart sounds. PULM: CTA B, no wheezes, crackles, rhonchi. No retractions. No resp. distress. No accessory muscle use. EXTR: No c/c/e NEURO antalgic gait, uses cane  Minimal edema of both LE, more so on the right She has diffuse tenderness of the right calf esp just distal to the knee, no evidence of compartment syndrome however Normal BLE DP pulse, feet are warm and well perfused Knee shows no effusion or heat  PSYCH: Normally interactive. Conversant. Not depressed or anxious appearing.  Calm demeanor.    Assessment and Plan:   ICD-10-CM   1. Right leg pain  M79.604 DG Knee Complete 4 Views Right    US Venous Img Lower Unilateral Right  2. Lower extremity edema  R60.0 hydrochlorothiazide (MICROZIDE) 12.5 MG capsule  3. Acute pain of right knee  M25.561    Following up on right knee pain- will obtain knee films and also get an [place] to rule out DVT Assuming nothing acute will likely have her follow-up with orthopedics Her BP is elevated and she has some swelling- will add HCTZ 12.5 mg to her  regimen  Will plan further follow- up pending imaging studies  Follow-up: No follow-ups on file.  Meds ordered this encounter  Medications  . hydrochlorothiazide (MICROZIDE) 12.5 MG capsule    Sig: Take 1 capsule (12.5 mg total) by mouth daily. Use as needed for swelling    Dispense:  30 capsule    Refill:  2   Orders Placed This Encounter  Procedures  . DG Knee Complete 4 Views  Right  . US Venous Img Lower Unilateral Right   Signed Lamar Blinks, MD  Received her imaging studies 7/14, gave her a call  No DVT, knee films are not acute Will refer back to [place] ortho to eval her knee further   US Venous Img Lower Unilateral Right  Result Date: 08/27/2018 CLINICAL DATA:  Right medial knee pain for the past 3-4 days. Evaluate for DVT. EXAM: RIGHT LOWER EXTREMITY VENOUS DOPPLER ULTRASOUND TECHNIQUE: Gray-scale sonography with graded compression, as well as color Doppler and duplex ultrasound were performed to evaluate the lower extremity deep venous systems from the level of the common femoral vein and including the common femoral, femoral, profunda femoral, popliteal and calf veins including the posterior tibial, peroneal and gastrocnemius veins when visible. The superficial great saphenous vein was also interrogated. Spectral Doppler was utilized to evaluate flow at rest and with distal augmentation maneuvers in the common femoral, femoral and popliteal veins. COMPARISON:  None. FINDINGS: Contralateral Common Femoral Vein: Respiratory phasicity is normal and symmetric with the symptomatic side. No evidence of thrombus. Normal compressibility. Common Femoral Vein: No evidence of thrombus. Normal compressibility, respiratory phasicity and response to augmentation. Saphenofemoral Junction: No evidence of thrombus. Normal compressibility and flow on color Doppler imaging. Profunda Femoral Vein: No evidence of thrombus. Normal compressibility and flow on color Doppler imaging. Femoral Vein: No  evidence of thrombus. Normal compressibility, respiratory phasicity and response to augmentation. Popliteal Vein: No evidence of thrombus. Normal compressibility, respiratory phasicity and response to augmentation. Calf Veins: No evidence of thrombus. Normal compressibility and flow on color Doppler imaging. Superficial Great Saphenous Vein: No evidence of thrombus. Normal compressibility. Venous Reflux:  None. Other Findings:  None. IMPRESSION: No evidence of DVT within the right lower extremity. Electronically Signed   By: Sandi Mariscal M.D.   On: 08/27/2018 06:52   Dg Knee Complete 4 Views Right  Result Date: 08/26/2018 CLINICAL DATA:  RIGHT knee pain, sudden onset 3 days ago. EXAM: RIGHT KNEE - COMPLETE 4+ VIEW COMPARISON:  None. FINDINGS: Mild degenerative spurring at the lateral compartment joint line. Medial compartment appears well preserved. No significant degenerative change seen at the patellofemoral compartment. No acute or suspicious osseous lesion. No fracture line or displaced fracture fragment seen. No appreciable joint effusion and adjacent soft tissues are unremarkable. IMPRESSION: 1. No acute findings. 2. Mild degenerative change. Electronically Signed   By: Franki Cabot M.D.   On: 08/26/2018 19:40

## 2018-08-26 NOTE — Patient Instructions (Signed)
Good to see you again today!   Please go to the imaging dept on the ground floor to have x-rays of your right knee.  They will explain how to come back for your ultrasound this evening at 6:30 pm  I gave you an rx for hydrochlorothiazide 12.5 mg- add this to your other medications for a few days as needed for swelling  While you are taking the extra hydrochlorothiazide we need to make sure you are getting enough potassium- I gave you a list of high potassium foods, be sure to eat a few of these daily

## 2018-08-26 NOTE — Telephone Encounter (Signed)
Spoke with pt. She has bilateral feet and leg swelling. Thinks this is worsened when she takes her thyroid medication. Scheduled in person visit today at 3:30pm. Per PCP.

## 2018-08-26 NOTE — Telephone Encounter (Signed)
Copied from [place] [phone]. Topic: General - Other >> Aug 26, 2018 10:20 AM Leward Quan A wrote: Reason for [id number] request a call back from Dr Lorelei Pont in reference to fluid build up in her knee, leg and ankle state that it may be from some of her medication she is taking. Asking for a call back to discuss what can be done. Ph# [phone]

## 2018-08-27 NOTE — Addendum Note (Signed)
Addended by: Lamar Blinks C on: 08/27/2018 03:42 PM   Modules accepted: Orders

## 2018-09-03 ENCOUNTER — Ambulatory Visit
Admission: RE | Admit: 2018-09-03 | Discharge: 2018-09-03 | Disposition: A | Discharge status: Home / Self Care | Payer: Medicare Other | Source: Ambulatory Visit | Attending: Family Medicine | Admitting: Family Medicine

## 2018-09-03 ENCOUNTER — Other Ambulatory Visit: Payer: Self-pay

## 2018-09-03 DIAGNOSIS — Z1231 Encounter for screening mammogram for malignant neoplasm of breast: Secondary | ICD-10-CM | POA: Diagnosis not present

## 2018-09-03 DIAGNOSIS — Z9289 Personal history of other medical treatment: Secondary | ICD-10-CM

## 2018-09-18 ENCOUNTER — Telehealth: Payer: Self-pay | Admitting: Family Medicine

## 2018-09-18 DIAGNOSIS — Z96641 Presence of right artificial hip joint: Secondary | ICD-10-CM | POA: Diagnosis not present

## 2018-09-18 DIAGNOSIS — M25552 Pain in left hip: Secondary | ICD-10-CM | POA: Diagnosis not present

## 2018-09-18 DIAGNOSIS — M25561 Pain in right knee: Secondary | ICD-10-CM | POA: Diagnosis not present

## 2018-09-18 DIAGNOSIS — Z471 Aftercare following joint replacement surgery: Secondary | ICD-10-CM | POA: Diagnosis not present

## 2018-09-18 DIAGNOSIS — M25562 Pain in left knee: Secondary | ICD-10-CM | POA: Diagnosis not present

## 2018-09-18 NOTE — Telephone Encounter (Signed)
Called patient to schedule AWV, but no answer. Will try to call patient back at later time. SF °

## 2018-09-19 ENCOUNTER — Other Ambulatory Visit (HOSPITAL_COMMUNITY): Payer: Self-pay | Admitting: Physician Assistant

## 2018-09-19 DIAGNOSIS — Z96641 Presence of right artificial hip joint: Secondary | ICD-10-CM

## 2018-09-26 DIAGNOSIS — M25552 Pain in left hip: Secondary | ICD-10-CM | POA: Diagnosis not present

## 2018-09-27 ENCOUNTER — Ambulatory Visit (HOSPITAL_COMMUNITY)
Admission: RE | Admit: 2018-09-27 | Discharge: 2018-09-27 | Disposition: A | Discharge status: Home / Self Care | Payer: Medicare Other | Source: Ambulatory Visit | Attending: Physician Assistant | Admitting: Physician Assistant

## 2018-09-27 ENCOUNTER — Other Ambulatory Visit (HOSPITAL_COMMUNITY): Payer: Self-pay | Admitting: Physician Assistant

## 2018-09-27 ENCOUNTER — Encounter (HOSPITAL_COMMUNITY): Payer: Self-pay

## 2018-09-27 ENCOUNTER — Encounter (HOSPITAL_COMMUNITY)
Admission: RE | Admit: 2018-09-27 | Discharge: 2018-09-27 | Disposition: A | Discharge status: Home / Self Care | Payer: Medicare Other | Source: Ambulatory Visit | Attending: Physician Assistant | Admitting: Physician Assistant

## 2018-09-27 ENCOUNTER — Other Ambulatory Visit: Payer: Self-pay

## 2018-09-27 ENCOUNTER — Encounter (HOSPITAL_COMMUNITY): Admission: RE | Admit: 2018-09-27 | Payer: Medicare Other | Source: Ambulatory Visit

## 2018-09-27 DIAGNOSIS — M25552 Pain in left hip: Secondary | ICD-10-CM | POA: Diagnosis not present

## 2018-09-27 DIAGNOSIS — Z96641 Presence of right artificial hip joint: Secondary | ICD-10-CM | POA: Diagnosis not present

## 2018-09-27 DIAGNOSIS — M25551 Pain in right hip: Secondary | ICD-10-CM | POA: Diagnosis not present

## 2018-09-27 MED ORDER — TECHNETIUM TC 99M MEDRONATE IV KIT: 20.0000 | PACK | Freq: Once | INTRAVENOUS | Status: DC | PRN

## 2018-09-27 MED ORDER — TECHNETIUM TC 99M MEDRONATE IV KIT
21.0000 | PACK | Freq: Once | INTRAVENOUS | Status: AC | PRN
  Administered 2018-09-27: 21 via INTRAVENOUS

## 2018-09-30 DIAGNOSIS — M25561 Pain in right knee: Secondary | ICD-10-CM | POA: Diagnosis not present

## 2018-09-30 DIAGNOSIS — M79661 Pain in right lower leg: Secondary | ICD-10-CM | POA: Diagnosis not present

## 2018-09-30 DIAGNOSIS — M799 Soft tissue disorder, unspecified: Secondary | ICD-10-CM | POA: Diagnosis not present

## 2018-09-30 DIAGNOSIS — M25562 Pain in left knee: Secondary | ICD-10-CM | POA: Diagnosis not present

## 2018-09-30 DIAGNOSIS — R2681 Unsteadiness on feet: Secondary | ICD-10-CM | POA: Diagnosis not present

## 2018-09-30 DIAGNOSIS — Z96641 Presence of right artificial hip joint: Secondary | ICD-10-CM | POA: Diagnosis not present

## 2018-09-30 DIAGNOSIS — M6281 Muscle weakness (generalized): Secondary | ICD-10-CM | POA: Diagnosis not present

## 2018-09-30 DIAGNOSIS — E119 Type 2 diabetes mellitus without complications: Secondary | ICD-10-CM | POA: Diagnosis not present

## 2018-09-30 DIAGNOSIS — I10 Essential (primary) hypertension: Secondary | ICD-10-CM | POA: Diagnosis not present

## 2018-09-30 DIAGNOSIS — Z96642 Presence of left artificial hip joint: Secondary | ICD-10-CM | POA: Diagnosis not present

## 2018-09-30 DIAGNOSIS — M79662 Pain in left lower leg: Secondary | ICD-10-CM | POA: Diagnosis not present

## 2018-10-01 DIAGNOSIS — Z96641 Presence of right artificial hip joint: Secondary | ICD-10-CM | POA: Diagnosis not present

## 2018-10-01 DIAGNOSIS — R2681 Unsteadiness on feet: Secondary | ICD-10-CM | POA: Diagnosis not present

## 2018-10-01 DIAGNOSIS — Z96642 Presence of left artificial hip joint: Secondary | ICD-10-CM | POA: Diagnosis not present

## 2018-10-01 DIAGNOSIS — M799 Soft tissue disorder, unspecified: Secondary | ICD-10-CM | POA: Diagnosis not present

## 2018-10-01 DIAGNOSIS — I10 Essential (primary) hypertension: Secondary | ICD-10-CM | POA: Diagnosis not present

## 2018-10-01 DIAGNOSIS — M25561 Pain in right knee: Secondary | ICD-10-CM | POA: Diagnosis not present

## 2018-10-01 DIAGNOSIS — M25562 Pain in left knee: Secondary | ICD-10-CM | POA: Diagnosis not present

## 2018-10-01 DIAGNOSIS — E119 Type 2 diabetes mellitus without complications: Secondary | ICD-10-CM | POA: Diagnosis not present

## 2018-10-01 DIAGNOSIS — M6281 Muscle weakness (generalized): Secondary | ICD-10-CM | POA: Diagnosis not present

## 2018-10-01 DIAGNOSIS — M79661 Pain in right lower leg: Secondary | ICD-10-CM | POA: Diagnosis not present

## 2018-10-01 DIAGNOSIS — M79662 Pain in left lower leg: Secondary | ICD-10-CM | POA: Diagnosis not present

## 2018-10-03 DIAGNOSIS — M79662 Pain in left lower leg: Secondary | ICD-10-CM | POA: Diagnosis not present

## 2018-10-03 DIAGNOSIS — M79661 Pain in right lower leg: Secondary | ICD-10-CM | POA: Diagnosis not present

## 2018-10-03 DIAGNOSIS — M25561 Pain in right knee: Secondary | ICD-10-CM | POA: Diagnosis not present

## 2018-10-03 DIAGNOSIS — Z96642 Presence of left artificial hip joint: Secondary | ICD-10-CM | POA: Diagnosis not present

## 2018-10-03 DIAGNOSIS — E119 Type 2 diabetes mellitus without complications: Secondary | ICD-10-CM | POA: Diagnosis not present

## 2018-10-03 DIAGNOSIS — M799 Soft tissue disorder, unspecified: Secondary | ICD-10-CM | POA: Diagnosis not present

## 2018-10-03 DIAGNOSIS — M25562 Pain in left knee: Secondary | ICD-10-CM | POA: Diagnosis not present

## 2018-10-03 DIAGNOSIS — Z96641 Presence of right artificial hip joint: Secondary | ICD-10-CM | POA: Diagnosis not present

## 2018-10-03 DIAGNOSIS — M6281 Muscle weakness (generalized): Secondary | ICD-10-CM | POA: Diagnosis not present

## 2018-10-03 DIAGNOSIS — I10 Essential (primary) hypertension: Secondary | ICD-10-CM | POA: Diagnosis not present

## 2018-10-03 DIAGNOSIS — R2681 Unsteadiness on feet: Secondary | ICD-10-CM | POA: Diagnosis not present

## 2018-10-07 DIAGNOSIS — M79662 Pain in left lower leg: Secondary | ICD-10-CM | POA: Diagnosis not present

## 2018-10-07 DIAGNOSIS — M79661 Pain in right lower leg: Secondary | ICD-10-CM | POA: Diagnosis not present

## 2018-10-07 DIAGNOSIS — M6281 Muscle weakness (generalized): Secondary | ICD-10-CM | POA: Diagnosis not present

## 2018-10-07 DIAGNOSIS — M799 Soft tissue disorder, unspecified: Secondary | ICD-10-CM | POA: Diagnosis not present

## 2018-10-07 DIAGNOSIS — I10 Essential (primary) hypertension: Secondary | ICD-10-CM | POA: Diagnosis not present

## 2018-10-07 DIAGNOSIS — Z96641 Presence of right artificial hip joint: Secondary | ICD-10-CM | POA: Diagnosis not present

## 2018-10-07 DIAGNOSIS — E119 Type 2 diabetes mellitus without complications: Secondary | ICD-10-CM | POA: Diagnosis not present

## 2018-10-07 DIAGNOSIS — M25561 Pain in right knee: Secondary | ICD-10-CM | POA: Diagnosis not present

## 2018-10-07 DIAGNOSIS — R2681 Unsteadiness on feet: Secondary | ICD-10-CM | POA: Diagnosis not present

## 2018-10-07 DIAGNOSIS — M25562 Pain in left knee: Secondary | ICD-10-CM | POA: Diagnosis not present

## 2018-10-07 DIAGNOSIS — Z96642 Presence of left artificial hip joint: Secondary | ICD-10-CM | POA: Diagnosis not present

## 2018-10-08 DIAGNOSIS — M25561 Pain in right knee: Secondary | ICD-10-CM | POA: Diagnosis not present

## 2018-10-08 DIAGNOSIS — R2681 Unsteadiness on feet: Secondary | ICD-10-CM | POA: Diagnosis not present

## 2018-10-08 DIAGNOSIS — M25562 Pain in left knee: Secondary | ICD-10-CM | POA: Diagnosis not present

## 2018-10-08 DIAGNOSIS — I10 Essential (primary) hypertension: Secondary | ICD-10-CM | POA: Diagnosis not present

## 2018-10-08 DIAGNOSIS — E119 Type 2 diabetes mellitus without complications: Secondary | ICD-10-CM | POA: Diagnosis not present

## 2018-10-08 DIAGNOSIS — M79662 Pain in left lower leg: Secondary | ICD-10-CM | POA: Diagnosis not present

## 2018-10-08 DIAGNOSIS — M79661 Pain in right lower leg: Secondary | ICD-10-CM | POA: Diagnosis not present

## 2018-10-08 DIAGNOSIS — Z96642 Presence of left artificial hip joint: Secondary | ICD-10-CM | POA: Diagnosis not present

## 2018-10-08 DIAGNOSIS — Z96641 Presence of right artificial hip joint: Secondary | ICD-10-CM | POA: Diagnosis not present

## 2018-10-08 DIAGNOSIS — M799 Soft tissue disorder, unspecified: Secondary | ICD-10-CM | POA: Diagnosis not present

## 2018-10-08 DIAGNOSIS — M6281 Muscle weakness (generalized): Secondary | ICD-10-CM | POA: Diagnosis not present

## 2018-10-10 DIAGNOSIS — M6281 Muscle weakness (generalized): Secondary | ICD-10-CM | POA: Diagnosis not present

## 2018-10-10 DIAGNOSIS — M25562 Pain in left knee: Secondary | ICD-10-CM | POA: Diagnosis not present

## 2018-10-10 DIAGNOSIS — M25561 Pain in right knee: Secondary | ICD-10-CM | POA: Diagnosis not present

## 2018-10-10 DIAGNOSIS — M79662 Pain in left lower leg: Secondary | ICD-10-CM | POA: Diagnosis not present

## 2018-10-10 DIAGNOSIS — M79661 Pain in right lower leg: Secondary | ICD-10-CM | POA: Diagnosis not present

## 2018-10-10 DIAGNOSIS — I10 Essential (primary) hypertension: Secondary | ICD-10-CM | POA: Diagnosis not present

## 2018-10-10 DIAGNOSIS — M799 Soft tissue disorder, unspecified: Secondary | ICD-10-CM | POA: Diagnosis not present

## 2018-10-10 DIAGNOSIS — E119 Type 2 diabetes mellitus without complications: Secondary | ICD-10-CM | POA: Diagnosis not present

## 2018-10-10 DIAGNOSIS — R2681 Unsteadiness on feet: Secondary | ICD-10-CM | POA: Diagnosis not present

## 2018-10-10 DIAGNOSIS — Z96642 Presence of left artificial hip joint: Secondary | ICD-10-CM | POA: Diagnosis not present

## 2018-10-10 DIAGNOSIS — Z96641 Presence of right artificial hip joint: Secondary | ICD-10-CM | POA: Diagnosis not present

## 2018-10-11 DIAGNOSIS — M25552 Pain in left hip: Secondary | ICD-10-CM | POA: Diagnosis not present

## 2018-10-11 DIAGNOSIS — Z471 Aftercare following joint replacement surgery: Secondary | ICD-10-CM | POA: Diagnosis not present

## 2018-10-11 DIAGNOSIS — Z96641 Presence of right artificial hip joint: Secondary | ICD-10-CM | POA: Diagnosis not present

## 2018-11-09 DIAGNOSIS — Z23 Encounter for immunization: Secondary | ICD-10-CM | POA: Diagnosis not present

## 2018-12-01 ENCOUNTER — Other Ambulatory Visit: Payer: Self-pay | Admitting: Family Medicine

## 2018-12-01 DIAGNOSIS — R600 Localized edema: Secondary | ICD-10-CM

## 2018-12-17 ENCOUNTER — Encounter: Payer: Self-pay | Admitting: Family Medicine

## 2018-12-17 DIAGNOSIS — H25011 Cortical age-related cataract, right eye: Secondary | ICD-10-CM | POA: Diagnosis not present

## 2018-12-17 DIAGNOSIS — H43813 Vitreous degeneration, bilateral: Secondary | ICD-10-CM | POA: Diagnosis not present

## 2018-12-17 DIAGNOSIS — H2511 Age-related nuclear cataract, right eye: Secondary | ICD-10-CM | POA: Diagnosis not present

## 2018-12-17 DIAGNOSIS — H524 Presbyopia: Secondary | ICD-10-CM | POA: Diagnosis not present

## 2018-12-17 LAB — HM DIABETES EYE EXAM

## 2019-01-17 ENCOUNTER — Other Ambulatory Visit: Payer: Self-pay | Admitting: Family Medicine

## 2019-01-17 DIAGNOSIS — I10 Essential (primary) hypertension: Secondary | ICD-10-CM

## 2019-01-17 NOTE — Telephone Encounter (Signed)
Please review for refill. Prescription will expire on 01/29/2019.

## 2019-01-17 NOTE — Telephone Encounter (Signed)
Copied from [place] [phone]. Topic: Quick Communication - Rx Refill/Question >> Jan 17, 2019  4:23 PM Rainey Pines A wrote: Medication: losartan-hydrochlorothiazide (HYZAAR) 50-12.5 MG tablet (Patient stated that pharmacy has tried reaching out multiple times to get medication signed off on and sent back over.)  Has the patient contacted their pharmacy? Yes (Agent: If no, request that the patient contact the pharmacy for the refill.) (Agent: If yes, when and what did the pharmacy advise?)Contact PCP  Preferred Pharmacy (with phone number or street name): [facility] [id number], [place] [phone] (Phone) [phone] (Fax)    Agent: Please be advised that RX refills may take up to 3 business days. We ask that you follow-up with your pharmacy.

## 2019-01-20 MED ORDER — LOSARTAN POTASSIUM-HCTZ 50-12.5 MG PO TABS: 1.0000 | ORAL_TABLET | Freq: Every day | ORAL | 0 refills | Status: DC

## 2019-01-27 ENCOUNTER — Other Ambulatory Visit: Payer: Self-pay

## 2019-01-27 ENCOUNTER — Ambulatory Visit (INDEPENDENT_AMBULATORY_CARE_PROVIDER_SITE_OTHER): Payer: Medicare Other | Admitting: Family Medicine

## 2019-01-27 ENCOUNTER — Encounter: Payer: Self-pay | Admitting: Family Medicine

## 2019-01-27 VITALS — BP 130/82 | HR 84 | Temp 96.7°F | Resp 18 | Ht 72.0 in | Wt 224.0 lb

## 2019-01-27 DIAGNOSIS — I10 Essential (primary) hypertension: Secondary | ICD-10-CM | POA: Diagnosis not present

## 2019-01-27 DIAGNOSIS — E890 Postprocedural hypothyroidism: Secondary | ICD-10-CM | POA: Diagnosis not present

## 2019-01-27 DIAGNOSIS — E119 Type 2 diabetes mellitus without complications: Secondary | ICD-10-CM | POA: Diagnosis not present

## 2019-01-27 DIAGNOSIS — E782 Mixed hyperlipidemia: Secondary | ICD-10-CM

## 2019-01-27 LAB — COMPREHENSIVE METABOLIC PANEL
ALT: 18 U/L (ref 0–35)
AST: 22 U/L (ref 0–37)
Albumin: 4.4 g/dL (ref 3.5–5.2)
Alkaline Phosphatase: 81 U/L (ref 39–117)
BUN: 13 mg/dL (ref 6–23)
CO2: 26 mEq/L (ref 19–32)
Calcium: 9.9 mg/dL (ref 8.4–10.5)
Chloride: 102 mEq/L (ref 96–112)
Creatinine, Ser: 0.78 mg/dL (ref 0.40–1.20)
GFR: 86.24 mL/min (ref >60.00)
Glucose, Bld: 154 mg/dL — ABNORMAL HIGH (ref 70–99)
Potassium: 3.7 mEq/L (ref 3.5–5.1)
Sodium: 138 mEq/L (ref 135–145)
Total Bilirubin: 0.6 mg/dL (ref 0.2–1.2)
Total Protein: 7.7 g/dL (ref 6.0–8.3)

## 2019-01-27 LAB — LIPID PANEL
Cholesterol: 161 mg/dL (ref 0–200)
HDL: 44.5 mg/dL (ref >39.00)
LDL Cholesterol: 87 mg/dL (ref 0–99)
NonHDL: 116.09
Total CHOL/HDL Ratio: 4
Triglycerides: 144 mg/dL (ref 0.0–149.0)
VLDL: 28.8 mg/dL (ref 0.0–40.0)

## 2019-01-27 LAB — CBC
HCT: 38.6 % (ref 36.0–46.0)
Hemoglobin: 13.4 g/dL (ref 12.0–15.0)
MCHC: 34.6 g/dL (ref 30.0–36.0)
MCV: 93.3 fl (ref 78.0–100.0)
Platelets: 219 10*3/uL (ref 150.0–400.0)
RBC: 4.14 Mil/uL (ref 3.87–5.11)
RDW: 13.6 % (ref 11.5–15.5)
WBC: 5.3 10*3/uL (ref 4.0–10.5)

## 2019-01-27 LAB — HEMOGLOBIN A1C: Hgb A1c MFr Bld: 7 % — ABNORMAL HIGH (ref 4.6–6.5)

## 2019-01-27 LAB — TSH: TSH: 2.99 u[IU]/mL (ref 0.35–4.50)

## 2019-01-27 MED ORDER — LOSARTAN POTASSIUM-HCTZ 50-12.5 MG PO TABS: 1.0000 | ORAL_TABLET | Freq: Every day | ORAL | 3 refills | Status: DC

## 2019-01-27 MED ORDER — ATORVASTATIN CALCIUM 20 MG PO TABS: ORAL_TABLET | ORAL | 3 refills | Status: DC

## 2019-01-27 MED ORDER — METFORMIN HCL 500 MG PO TABS: ORAL_TABLET | ORAL | 3 refills | Status: DC

## 2019-01-27 NOTE — Progress Notes (Addendum)
[facility] [place], [place], [place] [address] [id number] [phone]  Date:  01/27/2019   Name:  Anne [name]   DOB:  1941/01/11   MRN:  [id number]  PCP:  Darreld Mclean, MD    Chief Complaint: Diabetes (6 month follow up)   History of Present Illness:  Anne [name] is a 78 y.o. very pleasant female patient who presents with the following:  Here today for a 6 month follow-up visit History of diabetes, hyperlipidemia, hypertension, hypothyroidism Thyroid cancer status post thyroidectomy in 2011  I saw her most recently this past July with complaint of bilateral foot and leg swelling, as well as knee pain At that time I added HCTZ 12.5 to her regimen due to swelling and elevated blood pressure She used the hctz just a few times and then the swelling seemed to resolve  Referred her back to [facility]- she did some PT a few times but stopped due to pandemic/ too many people around at the PT office  Lipitor Aspirin HCTZ 25 Losartan 50 Synthroid Metformin  Her 80 yo grandson Harrell Gave has a brain meningioma which he has battled for several years  His cancer has grown a bit, they plan to do another operation at some point in a couple of years  He is feeling well -sadly and he has not been able to see him all year due to COVID-19 pandemic  Lab Results  Component Value Date   HGBA1C 7.2 (H) 07/25/2018   Lab Results  Component Value Date   TSH 3.98 07/25/2018   Flu vaccine- done this fall  Due for A1c Can do complete labs today- she is not fasting this am  Suggest shingles vaccine Mammogram up-to-date DEXA scan due this year  She is trying to exercise as much as possible  Wt Readings from Last 3 Encounters:  01/27/19 224 lb (101.6 kg)  08/26/18 223 lb 3.2 oz (101.2 kg)  07/25/18 223 lb (101.2 kg)    Patient Active Problem List   Diagnosis Date Noted  . Controlled type 2 diabetes mellitus  without complication, without long-term current use of insulin ([place]) 03/31/2015  . Chest pressure 03/03/2013  . Globus sensation 03/03/2013  . DOE (dyspnea on exertion) 03/03/2013  . Thyroid cancer ([place]) 01/05/2012  . Hyperlipidemia 01/05/2012  . Dysphonia 01/05/2012  . 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 Hypertension 01/04/2012  . Hypothyroid 01/04/2012    Past Medical History:  Diagnosis Date  . Arthritis   . Cataract   . Comedone   . Hypertension   . Thyroid disease    thyroidectomy in 2011    Past Surgical History:  Procedure Laterality Date  . CATARACT EXTRACTION    . EYE SURGERY    . JOINT REPLACEMENT    . THYROIDECTOMY  12/25/2009  . TONSILLECTOMY AND ADENOIDECTOMY    . 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 TOTAL HIP ARTHROPLASTY Right 06/03/2008    Social History   Tobacco Use  . Smoking status: Never Smoker  . Smokeless tobacco: Never Used  Substance Use Topics  . Alcohol use: No  . Drug use: No    Family History  Problem Relation Age of Onset  . Stroke Mother   . Cancer Father   . Cancer Sister   . Cancer Sister   . Cancer Sister   . Miscarriages / Stillbirths Neg Hx     No Known Allergies  Medication list has been reviewed and updated.  Current Outpatient Medications on File Prior to Visit  Medication Sig Dispense Refill  . acetaminophen (TYLENOL) 500 MG tablet Take 500 mg by mouth every 6 (six) hours as needed for mild pain.    Marland Kitchen aspirin 81 MG tablet Take 81 mg by mouth daily.    Marland Kitchen b complex vitamins tablet Take 1 tablet by mouth daily.    . Calcium-Phosphorus-Vitamin D (CITRACAL +D3 PO) Take 2 tablets by mouth 2 (two) times daily.    . Cholecalciferol (VITAMIN D-3) 5000 UNITS TABS Take by mouth daily. Reported on 02/24/2015    . famotidine (PEPCID) 20 MG tablet TAKE 1/2 TABLET(10 MG) BY MOUTH DAILY 45 tablet 3  . Glycerin-Hypromellose-PEG 400 ([facility] DRY EYE RELIEF OP) Apply 2 drops to eye daily.    . hydrochlorothiazide  (MICROZIDE) 12.5 MG capsule TAKE ONE CAPSULE BY MOUTH DAILY AS NEEDED FOR SWELLING 90 capsule 5  . levothyroxine (SYNTHROID) 112 MCG tablet Take 1 tablet (112 mcg total) by mouth daily. 90 tablet 3   No current facility-administered medications on file prior to visit.    Review of Systems:  As per HPI- otherwise negative. No chest pain or shortness of breath Weight is stable  Wt Readings from Last 3 Encounters:  01/27/19 224 lb (101.6 kg)  08/26/18 223 lb 3.2 oz (101.2 kg)  07/25/18 223 lb (101.2 kg)      Physical Examination: Vitals:   01/27/19 1019  BP: 130/82  Pulse: 84  Resp: 18  Temp: (!) 96.7 F (35.9 C)  SpO2: 98%   Vitals:   01/27/19 1019  Weight: 224 lb (101.6 kg)  Height: 6' (1.829 m)   Body mass index is 30.38 kg/m. Ideal Body Weight: Weight in (lb) to have BMI = 25: 183.9  GEN: WDWN, NAD, Non-toxic, A & O x 3, obese, looks well HEENT: Atraumatic, Normocephalic. Neck supple. No masses, No LAD. TM wnl bilaterally  Ears and Nose: No external deformity. CV: RRR, No M/G/R. No JVD. No thrill. No extra heart sounds. PULM: CTA B, no wheezes, crackles, rhonchi. No retractions. No resp. distress. No accessory muscle use. ABD: S, NT, ND, +BS. No rebound. No HSM. EXTR: No c/c/e NEURO Normal gait.  PSYCH: Normally interactive. Conversant. Not depressed or anxious appearing.  Calm demeanor.    Assessment and Plan: Mixed hyperlipidemia - Plan: Lipid panel, atorvastatin (LIPITOR) 20 MG tablet  Controlled type 2 diabetes mellitus without complication, without long-term current use of insulin (H[facility]) - Plan: Comprehensive metabolic panel, Hemoglobin A1c, metFORMIN (GLUCOPHAGE) 500 MG tablet  Essential hypertension - Plan: CBC, Comprehensive metabolic panel, losartan-hydrochlorothiazide (HYZAAR) 50-12.5 MG tablet  Postoperative hypothyroidism - Plan: TSH  Here today for a follow-up visit Blood pressure under good control Labs pending as above Encouraged shingles  vaccine  This visit occurred during the SARS-CoV-2 public health emergency.  Safety protocols were in place, including screening questions prior to the visit, additional usage of staff PPE, and extensive cleaning of exam room while observing appropriate contact time as indicated for disinfecting solutions.    Signed Lamar Blinks, MD  Received her labs 12/15, letter to patient A1c is well controlled  Results for orders placed or performed in visit on 01/27/19  CBC  Result Value Ref Range   WBC 5.3 4.0 - 10.5 K/uL   RBC 4.14 3.87 - 5.11 Mil/uL   Platelets 219.0 150.0 - 400.0 K/uL   Hemoglobin 13.4 12.0 - 15.0 g/dL   HCT 38.6 36.0 -  46.0 %   MCV 93.3 78.0 - 100.0 fl   MCHC 34.6 30.0 - 36.0 g/dL   RDW 13.6 11.5 - 15.5 %  Comprehensive metabolic panel  Result Value Ref Range   Sodium 138 135 - 145 mEq/L   Potassium 3.7 3.5 - 5.1 mEq/L   Chloride 102 96 - 112 mEq/L   CO2 26 19 - 32 mEq/L   Glucose, Bld 154 (H) 70 - 99 mg/dL   BUN 13 6 - 23 mg/dL   Creatinine, Ser 0.78 0.40 - 1.20 mg/dL   Total Bilirubin 0.6 0.2 - 1.2 mg/dL   Alkaline Phosphatase 81 39 - 117 U/L   AST 22 0 - 37 U/L   ALT 18 0 - 35 U/L   Total Protein 7.7 6.0 - 8.3 g/dL   Albumin 4.4 3.5 - 5.2 g/dL   GFR 86.24 >60.00 mL/min   Calcium 9.9 8.4 - 10.5 mg/dL  Hemoglobin A1c  Result Value Ref Range   Hgb A1c MFr Bld 7.0 (H) 4.6 - 6.5 %  Lipid panel  Result Value Ref Range   Cholesterol 161 0 - 200 mg/dL   Triglycerides 144.0 0.0 - 149.0 mg/dL   HDL 44.50 >39.00 mg/dL   VLDL 28.8 0.0 - 40.0 mg/dL   LDL Cholesterol 87 0 - 99 mg/dL   Total CHOL/HDL Ratio 4    NonHDL 116.09   TSH  Result Value Ref Range   TSH 2.99 0.35 - 4.50 uIU/mL

## 2019-01-27 NOTE — Patient Instructions (Addendum)
It was great to see you again today, I will be in touch with your labs ASAP I would suggest that you get the shingles vaccine given at your drugstore, at your convenience Happy holidays!   Please see me in 4-6 months

## 2019-03-20 ENCOUNTER — Telehealth: Payer: Self-pay | Admitting: Family Medicine

## 2019-03-20 NOTE — Telephone Encounter (Signed)
Called patient on separate number, she wanted to discuss getting the covid vaccine and how she can do that. I gave her the wesbite to join cones waiting list. She was very appreciative.

## 2019-03-20 NOTE — Telephone Encounter (Signed)
Patient would like Dr Lillie Fragmin nurse to call her back to discuss something. Patient wouldn't say what she wanted to discuss.

## 2019-03-20 NOTE — Telephone Encounter (Signed)
Tried calling patient, no answer call dropped after several rings.

## 2019-04-06 ENCOUNTER — Ambulatory Visit: Payer: Medicare Other | Attending: Internal Medicine

## 2019-04-06 ENCOUNTER — Other Ambulatory Visit: Payer: Self-pay

## 2019-04-06 DIAGNOSIS — Z23 Encounter for immunization: Secondary | ICD-10-CM | POA: Insufficient documentation

## 2019-04-06 NOTE — Progress Notes (Signed)
   Covid-19 Vaccination Clinic  Name:  Anne [name]    MRN: [id number] DOB: 1941-02-08  04/06/2019  Anne [name] was observed post Covid-19 immunization for 15 minutes without incidence. She was provided with Vaccine Information Sheet and instruction to access the V-Safe system.   Anne [name] was instructed to call 911 with any severe reactions post vaccine: Marland Kitchen Difficulty breathing  . Swelling of your face and throat  . A fast heartbeat  . A bad rash all over your body  . Dizziness and weakness    Immunizations Administered    Name Date Dose VIS Date Route   Pfizer COVID-19 Vaccine 04/06/2019 11:01 AM 0.3 mL 01/24/2019 Intramuscular   Manufacturer: [place]   Lot: [id number]   [place]: [id number]

## 2019-04-17 DIAGNOSIS — Z96643 Presence of artificial hip joint, bilateral: Secondary | ICD-10-CM | POA: Diagnosis not present

## 2019-04-17 DIAGNOSIS — M25552 Pain in left hip: Secondary | ICD-10-CM | POA: Diagnosis not present

## 2019-04-29 ENCOUNTER — Ambulatory Visit: Payer: Medicare Other | Attending: Internal Medicine

## 2019-04-29 DIAGNOSIS — Z23 Encounter for immunization: Secondary | ICD-10-CM

## 2019-04-29 NOTE — Progress Notes (Signed)
   Covid-19 Vaccination Clinic  Name:  LACHAE RHYNES    MRN: [id number] DOB: 07-13-1940  04/29/2019  Ms. Goldbach was observed post Covid-19 immunization for 15 minutes without incident. She was provided with Vaccine Information Sheet and instruction to access the V-Safe system.   Ms. Valois was instructed to call 911 with any severe reactions post vaccine: Marland Kitchen Difficulty breathing  . Swelling of face and throat  . A fast heartbeat  . A bad rash all over body  . Dizziness and weakness   Immunizations Administered    Name Date Dose VIS Date Route   Pfizer COVID-19 Vaccine 04/29/2019 10:57 AM 0.3 mL 01/24/2019 Intramuscular   Manufacturer: [place]   Lot: [id number]   [place]: [id number]

## 2019-05-29 ENCOUNTER — Ambulatory Visit: Payer: Medicare Other | Admitting: *Deleted

## 2019-07-28 NOTE — Progress Notes (Addendum)
[facility] [place], [place], [place] [address] [phone]  Date:  07/30/2019   Name:  Anne [name]   DOB:  Jul 25, 1940   MRN:  [id number]  PCP:  Darreld Mclean, MD    Chief Complaint: Diabetes (6 month follow up) and Hyperlipidemia   History of Present Illness:  Anne [name] is a 79 y.o. very pleasant female patient who presents with the following:  Patient here today for periodic follow-up visit Last seen by myself in December History of diabetes, hyperlipidemia, hypertension, hypothyroidism Thyroid cancer status post thyroidectomy in 2011  Married to [place] Their young grandson has struggled the last several years with a brain meningioma His condition is stable for the time being Her daughter however is facing some surgery coming up however   Foot exam now due; today  A1c is due COVID-19 series is complete Cologuard 2018, can update this year Blood work done in December, A1c at that time 7%   She does exercises at home that she learned at PT to maintain her leg strength and flexability   Patient Active Problem List   Diagnosis Date Noted  . Controlled type 2 diabetes mellitus without complication, without long-term current use of insulin ([place]) 03/31/2015  . Chest pressure 03/03/2013  . Globus sensation 03/03/2013  . DOE (dyspnea on exertion) 03/03/2013  . Thyroid cancer ([place]) 01/05/2012  . Hyperlipidemia 01/05/2012  . Dysphonia 01/05/2012  . 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 Hypertension 01/04/2012  . Hypothyroid 01/04/2012    Past Medical History:  Diagnosis Date  . Arthritis   . Cataract   . Comedone   . Hypertension   . Thyroid disease    thyroidectomy in 2011    Past Surgical History:  Procedure Laterality Date  . CATARACT EXTRACTION    . EYE SURGERY    . JOINT REPLACEMENT    . THYROIDECTOMY  12/25/2009  . TONSILLECTOMY AND ADENOIDECTOMY    . TOTAL HIP  ARTHROPLASTY Left 10/03/2007  . TOTAL HIP ARTHROPLASTY Right 06/03/2008    Social History   Tobacco Use  . Smoking status: Never Smoker  . Smokeless tobacco: Never Used  Substance Use Topics  . Alcohol use: No  . Drug use: No    Family History  Problem Relation Age of Onset  . Stroke Mother   . Cancer Father   . Cancer Sister   . Cancer Sister   . Cancer Sister   . Miscarriages / Stillbirths Neg Hx     No Known Allergies  Medication list has been reviewed and updated.  Current Outpatient Medications on File Prior to Visit  Medication Sig Dispense Refill  . acetaminophen (TYLENOL) 500 MG tablet Take 500 mg by mouth every 6 (six) hours as needed for mild pain.    Marland Kitchen amoxicillin (AMOXIL) 500 MG capsule TK FOUR CS PO 1 HOUR B DAPP    . aspirin 81 MG tablet Take 81 mg by mouth daily.    Marland Kitchen atorvastatin (LIPITOR) 20 MG tablet TAKE 1 TABLET(20 MG) BY MOUTH DAILY 90 tablet 3  . b complex vitamins tablet Take 1 tablet by mouth daily.    . Calcium-Phosphorus-Vitamin D (CITRACAL +D3 PO) Take 2 tablets by mouth 2 (two) times daily.    . Cholecalciferol (VITAMIN D-3) 5000 UNITS TABS Take by mouth daily. Reported on 02/24/2015    . famotidine (PEPCID) 20 MG tablet TAKE 1/2 TABLET(10 MG) BY MOUTH  DAILY 45 tablet 3  . Glycerin-Hypromellose-PEG 400 ([facility] DRY EYE RELIEF OP) Apply 2 drops to eye daily.    . hydrochlorothiazide (MICROZIDE) 12.5 MG capsule TAKE ONE CAPSULE BY MOUTH DAILY AS NEEDED FOR SWELLING 90 capsule 5  . levothyroxine (SYNTHROID) 112 MCG tablet Take 1 tablet (112 mcg total) by mouth daily. 90 tablet 3  . losartan-hydrochlorothiazide (HYZAAR) 50-12.5 MG tablet Take 1 tablet by mouth daily. 90 tablet 3  . metFORMIN (GLUCOPHAGE) 500 MG tablet TAKE 1 TABLET BY MOUTH DAILY 90 tablet 3   No current facility-administered medications on file prior to visit.    Review of Systems:  As per HPI- otherwise negative.   Physical Examination: Vitals:   07/30/19 1033  BP: (!) 146/90   Pulse: 87  Resp: 19  Temp: (!) 97.4 F (36.3 C)  SpO2: 97%   Vitals:   07/30/19 1033  Weight: 223 lb (101.2 kg)  Height: 6' (1.829 m)   Body mass index is 30.24 kg/m. Ideal Body Weight: Weight in (lb) to have BMI = 25: 183.9  GEN: no acute distress.  Obese, looks well  HEENT: Atraumatic, Normocephalic.  Ears and Nose: No external deformity. CV: RRR, No M/G/R. No JVD. No thrill. No extra heart sounds. PULM: CTA B, no wheezes, crackles, rhonchi. No retractions. No resp. distress. No accessory muscle use. ABD: S, NT, ND EXTR: No c/c/e PSYCH: Normally interactive. Conversant.  Foot exam- done today   BP Readings from Last 3 Encounters:  07/30/19 (!) 146/90  01/27/19 130/82  08/26/18 (!) 162/86     Assessment and Plan: Screening for colon cancer  Mixed hyperlipidemia - Plan: Lipid panel  Controlled type 2 diabetes mellitus without complication, without long-term current use of insulin ([place]) - Plan: Basic metabolic panel, Hemoglobin A1c  Essential hypertension - Plan: CBC, Basic metabolic panel  Postoperative hypothyroidism - Plan: TSH  Encounter for hepatitis C screening test for low risk patient - Plan: Hepatitis C antibody  Lower extremity edema  Pt notes that her feet sometimes swell when she rides in a car for a long time.  She uses HCTZ prn.  I also suggested that she try compression socks when traveling   Ordered cologuard for her today   Labs pending to follow-up on her thyroid and DM Hep C screening never done- catch up today Asked for 6 months recheck Moderate medical decision making today  This visit occurred during the SARS-CoV-2 public health emergency.  Safety protocols were in place, including screening questions prior to the visit, additional usage of staff PPE, and extensive cleaning of exam room while observing appropriate contact time as indicated for disinfecting solutions.    Signed Lamar Blinks, MD  Addendum 07/31/2019 Received her  labs as below, message to patient A1c is stable acceptable for age Results for orders placed or performed in visit on 07/30/19  CBC  Result Value Ref Range   WBC 5.4 4.0 - 10.5 K/uL   RBC 4.21 3.87 - 5.11 Mil/uL   Platelets 204.0 150 - 400 K/uL   Hemoglobin 13.7 12.0 - 15.0 g/dL   HCT 39.3 36 - 46 %   MCV 93.5 78.0 - 100.0 fl   MCHC 34.9 30.0 - 36.0 g/dL   RDW 13.4 11.5 - 37.8 %  Basic metabolic panel  Result Value Ref Range   Sodium 141 135 - 145 mEq/L   Potassium 3.9 3.5 - 5.1 mEq/L   Chloride 102 96 - 112 mEq/L   CO2 27 19 -  32 mEq/L   Glucose, Bld 136 (H) 70 - 99 mg/dL   BUN 14 6 - 23 mg/dL   Creatinine, Ser 0.77 0.40 - 1.20 mg/dL   GFR 87.42 >60.00 mL/min   Calcium 10.0 8.4 - 10.5 mg/dL  Hemoglobin A1c  Result Value Ref Range   Hgb A1c MFr Bld 7.2 (H) 4.6 - 6.5 %  Hepatitis C antibody  Result Value Ref Range   Hepatitis C Ab NON-REACTIVE NON-REACTI   SIGNAL TO CUT-OFF 0.04 <1.00  TSH  Result Value Ref Range   TSH 3.41 0.35 - 4.50 uIU/mL  Lipid panel  Result Value Ref Range   Cholesterol 149 0 - 200 mg/dL   Triglycerides 150.0 (H) 0 - 149 mg/dL   HDL 48.00 >39.00 mg/dL   VLDL 30.0 0.0 - 40.0 mg/dL   LDL Cholesterol 71 0 - 99 mg/dL   Total CHOL/HDL Ratio 3    NonHDL 101.26

## 2019-07-28 NOTE — Patient Instructions (Addendum)
It was great to see you again today Consider getting the Shingrix vaccine at your pharmacy if not done already  You might try wearing compression socks (you can buy in store in online) as needed to prevent swelling of your legs  You can also apply an unscented antipersperant to your forehead if you like to reduce sweating   I will be in touch with your labs Please see me in about 6 months and travel safely to visit your daughter

## 2019-07-30 ENCOUNTER — Ambulatory Visit (INDEPENDENT_AMBULATORY_CARE_PROVIDER_SITE_OTHER): Payer: Medicare Other | Admitting: Family Medicine

## 2019-07-30 ENCOUNTER — Encounter: Payer: Self-pay | Admitting: Family Medicine

## 2019-07-30 ENCOUNTER — Other Ambulatory Visit: Payer: Self-pay

## 2019-07-30 VITALS — BP 140/90 | HR 87 | Temp 97.4°F | Resp 19 | Ht 72.0 in | Wt 223.0 lb

## 2019-07-30 DIAGNOSIS — E119 Type 2 diabetes mellitus without complications: Secondary | ICD-10-CM

## 2019-07-30 DIAGNOSIS — I10 Essential (primary) hypertension: Secondary | ICD-10-CM

## 2019-07-30 DIAGNOSIS — E782 Mixed hyperlipidemia: Secondary | ICD-10-CM

## 2019-07-30 DIAGNOSIS — Z1159 Encounter for screening for other viral diseases: Secondary | ICD-10-CM | POA: Diagnosis not present

## 2019-07-30 DIAGNOSIS — R600 Localized edema: Secondary | ICD-10-CM | POA: Diagnosis not present

## 2019-07-30 DIAGNOSIS — Z1211 Encounter for screening for malignant neoplasm of colon: Secondary | ICD-10-CM | POA: Diagnosis not present

## 2019-07-30 DIAGNOSIS — E890 Postprocedural hypothyroidism: Secondary | ICD-10-CM | POA: Diagnosis not present

## 2019-07-30 LAB — CBC
HCT: 39.3 % (ref 36.0–46.0)
Hemoglobin: 13.7 g/dL (ref 12.0–15.0)
MCHC: 34.9 g/dL (ref 30.0–36.0)
MCV: 93.5 fl (ref 78.0–100.0)
Platelets: 204 10*3/uL (ref 150.0–400.0)
RBC: 4.21 Mil/uL (ref 3.87–5.11)
RDW: 13.4 % (ref 11.5–15.5)
WBC: 5.4 10*3/uL (ref 4.0–10.5)

## 2019-07-30 LAB — BASIC METABOLIC PANEL
BUN: 14 mg/dL (ref 6–23)
CO2: 27 mEq/L (ref 19–32)
Calcium: 10 mg/dL (ref 8.4–10.5)
Chloride: 102 mEq/L (ref 96–112)
Creatinine, Ser: 0.77 mg/dL (ref 0.40–1.20)
GFR: 87.42 mL/min (ref >60.00)
Glucose, Bld: 136 mg/dL — ABNORMAL HIGH (ref 70–99)
Potassium: 3.9 mEq/L (ref 3.5–5.1)
Sodium: 141 mEq/L (ref 135–145)

## 2019-07-30 LAB — LIPID PANEL
Cholesterol: 149 mg/dL (ref 0–200)
HDL: 48 mg/dL (ref >39.00)
LDL Cholesterol: 71 mg/dL (ref 0–99)
NonHDL: 101.26
Total CHOL/HDL Ratio: 3
Triglycerides: 150 mg/dL — ABNORMAL HIGH (ref 0.0–149.0)
VLDL: 30 mg/dL (ref 0.0–40.0)

## 2019-07-30 LAB — TSH: TSH: 3.41 u[IU]/mL (ref 0.35–4.50)

## 2019-07-30 MED ORDER — METFORMIN HCL 500 MG PO TABS: ORAL_TABLET | ORAL | 3 refills | Status: DC

## 2019-07-30 MED ORDER — LOSARTAN POTASSIUM-HCTZ 50-12.5 MG PO TABS: 1.0000 | ORAL_TABLET | Freq: Every day | ORAL | 3 refills | Status: DC

## 2019-07-30 MED ORDER — ATORVASTATIN CALCIUM 20 MG PO TABS: ORAL_TABLET | ORAL | 3 refills | Status: DC

## 2019-07-30 MED ORDER — LEVOTHYROXINE SODIUM 112 MCG PO TABS: 112.0000 ug | ORAL_TABLET | Freq: Every day | ORAL | 3 refills | Status: DC

## 2019-07-30 NOTE — Progress Notes (Signed)
Cologuard ordered for patient.  

## 2019-07-31 ENCOUNTER — Encounter: Payer: Self-pay | Admitting: Family Medicine

## 2019-07-31 LAB — HEPATITIS C ANTIBODY
Hepatitis C Ab: NONREACTIVE
SIGNAL TO CUT-OFF: 0.04 (ref <1.00)

## 2019-07-31 LAB — HEMOGLOBIN A1C: Hgb A1c MFr Bld: 7.2 % — ABNORMAL HIGH (ref 4.6–6.5)

## 2019-08-29 DIAGNOSIS — Z1211 Encounter for screening for malignant neoplasm of colon: Secondary | ICD-10-CM | POA: Diagnosis not present

## 2019-08-29 LAB — COLOGUARD: Cologuard: NEGATIVE

## 2019-09-08 ENCOUNTER — Encounter: Payer: Self-pay | Admitting: Family Medicine

## 2019-10-17 ENCOUNTER — Other Ambulatory Visit: Payer: Self-pay | Admitting: Family Medicine

## 2019-10-17 DIAGNOSIS — Z1231 Encounter for screening mammogram for malignant neoplasm of breast: Secondary | ICD-10-CM

## 2019-10-21 DIAGNOSIS — M25551 Pain in right hip: Secondary | ICD-10-CM | POA: Diagnosis not present

## 2019-10-29 ENCOUNTER — Telehealth: Payer: Self-pay | Admitting: Family Medicine

## 2019-10-29 NOTE — Telephone Encounter (Signed)
Caller: Brandyn Thien  Call Back # [phone]  Patient states seen last week at urgent care for hip and leg pain. Patient states precribed medication . Patient is now reporting vaginal itching and bleeding. Patient would like to be seen soon.  Please Advise

## 2019-10-30 ENCOUNTER — Ambulatory Visit (INDEPENDENT_AMBULATORY_CARE_PROVIDER_SITE_OTHER): Payer: Medicare Other | Admitting: Family Medicine

## 2019-10-30 ENCOUNTER — Encounter: Payer: Self-pay | Admitting: Family Medicine

## 2019-10-30 ENCOUNTER — Other Ambulatory Visit: Payer: Self-pay

## 2019-10-30 VITALS — BP 134/70 | HR 78 | Resp 15 | Ht 72.0 in | Wt 221.0 lb

## 2019-10-30 DIAGNOSIS — R319 Hematuria, unspecified: Secondary | ICD-10-CM

## 2019-10-30 DIAGNOSIS — N950 Postmenopausal bleeding: Secondary | ICD-10-CM | POA: Diagnosis not present

## 2019-10-30 LAB — POCT URINALYSIS DIP (MANUAL ENTRY)
Bilirubin, UA: NEGATIVE
Glucose, UA: NEGATIVE mg/dL
Ketones, POC UA: NEGATIVE mg/dL
Leukocytes, UA: NEGATIVE
Nitrite, UA: NEGATIVE
Spec Grav, UA: 1.015 (ref 1.010–1.025)
Urobilinogen, UA: 0.2 E.U./dL
pH, UA: 6.5 (ref 5.0–8.0)

## 2019-10-30 NOTE — Patient Instructions (Signed)
It was great to see you again today, I will be in touch with your other urine tests as soon as possible.  However, from our exam I suspect the blood you saw is actually coming from your vagina.  Any bleeding after menopause needs to be evaluated by gynecology.  I will set up an appointment for you with gynecology right here at the med center, please let me know if you do not hear from them in the next 1 to 2 weeks

## 2019-10-30 NOTE — Progress Notes (Signed)
[facility] [place], [place], [place] [address] [phone]  Date:  10/30/2019   Name:  Anne [name]   DOB:  [birth year]   MRN:  [id number]  PCP:  Darreld Mclean, MD    Chief Complaint: Hematuria (started tuesday- noticed in urine after taking prednisone pack on monday) and Vaginal Itching   History of Present Illness:  Anne [name] is a 79 y.o. very pleasant female patient who presents with the following:  Pt here today with vaginal symptoms  Last seen by myself in June of this year  History of controlled diabetes, hyperlipidemia, hypertension, hypothyroidism Thyroid cancer status post thyroidectomy in 2011  Flu vaccine-we will defer today as she just finished the Medrol dose pack 2 days ago  Pt notes she is having some difficulty with her right hip- she went to ortho UC and had evaluation about 10 days ago They gave a medrol dose pack that she recently finished  Her hip is getting better, she is using a cane to walk   2 days ago she noted some blood when she urinated This has not happened again She does not think this was vaginal bleeding but urinary bleeding.  She also did notice some generalized itching - not vaginal itching in particular No dysuria or frequency except when she was taking the steroids and drinking more liquids    Lab Results  Component Value Date   HGBA1C 7.2 (H) 07/30/2019   Lab Results  Component Value Date   TSH 3.41 07/30/2019    Patient Active Problem List   Diagnosis Date Noted  . Controlled type 2 diabetes mellitus without complication, without long-term current use of insulin ([place]) 03/31/2015  . Chest pressure 03/03/2013  . Globus sensation 03/03/2013  . DOE (dyspnea on exertion) 03/03/2013  . Thyroid cancer ([place]) 01/05/2012  . Hyperlipidemia 01/05/2012  . Dysphonia 01/05/2012  . 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 Hypertension 01/04/2012  .  Hypothyroid 01/04/2012    Past Medical History:  Diagnosis Date  . Arthritis   . Cataract   . [place]   . Hypertension   . Thyroid disease    thyroidectomy in 2011    Past Surgical History:  Procedure Laterality Date  . CATARACT EXTRACTION    . EYE SURGERY    . JOINT REPLACEMENT    . THYROIDECTOMY  12/25/2009  . TONSILLECTOMY AND ADENOIDECTOMY    . 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 TOTAL HIP ARTHROPLASTY Right 06/03/2008    Social History   Tobacco Use  . Smoking status: Never Smoker  . Smokeless tobacco: Never Used  Substance Use Topics  . Alcohol use: No  . Drug use: No    Family History  Problem Relation Age of Onset  . Stroke Mother   . Cancer Father   . Cancer Sister   . Cancer Sister   . Cancer Sister   . Miscarriages / Stillbirths Neg Hx     No Known Allergies  Medication list has been reviewed and updated.  Current Outpatient Medications on File Prior to Visit  Medication Sig Dispense Refill  . acetaminophen (TYLENOL) 500 MG tablet Take 500 mg by mouth every 6 (six) hours as needed for mild pain.    Marland Kitchen amoxicillin (AMOXIL) 500 MG capsule TK FOUR CS PO 1 HOUR B DAPP    . aspirin 81 MG tablet Take 81 mg by mouth daily.    Marland Kitchen  atorvastatin (LIPITOR) 20 MG tablet TAKE 1 TABLET(20 MG) BY MOUTH DAILY 90 tablet 3  . b complex vitamins tablet Take 1 tablet by mouth daily.    . Calcium-Phosphorus-Vitamin D (CITRACAL +D3 PO) Take 2 tablets by mouth 2 (two) times daily.    . Cholecalciferol (VITAMIN D-3) 5000 UNITS TABS Take by mouth daily. Reported on 02/24/2015    . famotidine (PEPCID) 20 MG tablet TAKE 1/2 TABLET(10 MG) BY MOUTH DAILY 45 tablet 3  . Glycerin-Hypromellose-PEG 400 ([facility] DRY EYE RELIEF OP) Apply 2 drops to eye daily.    . hydrochlorothiazide (MICROZIDE) 12.5 MG capsule TAKE ONE CAPSULE BY MOUTH DAILY AS NEEDED FOR SWELLING 90 capsule 5  . levothyroxine (SYNTHROID) 112 MCG tablet Take 1 tablet (112 mcg total) by mouth daily. 90 tablet 3  .  losartan-hydrochlorothiazide (HYZAAR) 50-12.5 MG tablet Take 1 tablet by mouth daily. 90 tablet 3  . metFORMIN (GLUCOPHAGE) 500 MG tablet TAKE 1 TABLET BY MOUTH DAILY 90 tablet 3   No current facility-administered medications on file prior to visit.    Review of Systems:  As per HPI- otherwise negative.   Physical Examination: Vitals:   10/30/19 1032 10/30/19 1112  BP: (!) 166/84 134/70  Pulse: 78   Resp: 15   SpO2: 97%    Vitals:   10/30/19 1032  Weight: 221 lb (100.2 kg)  Height: 6' (1.829 m)   Body mass index is 29.97 kg/m. Ideal Body Weight: Weight in (lb) to have BMI = 25: 183.9  GEN: no acute distress.  Obese, looks well and her normal self  HEENT: Atraumatic, Normocephalic.  Ears and Nose: No external deformity. CV: RRR, No M/G/R. No JVD. No thrill. No extra heart sounds. PULM: CTA B, no wheezes, crackles, rhonchi. No retractions. No resp. distress. No accessory muscle use. ABD: S, NT, ND, +BS. No rebound. No HSM.  Belly is benign EXTR: No c/c/e PSYCH: Normally interactive. Conversant.  GYN exam-performed a gentle speculum exam.  I do see some brownish discharge in the vaginal canal, suspicious for blood.  No bright red blood or obvious source of bleeding is identified  Did hemoccult of vaginal swab today- positive for blood   Results for orders placed or performed in visit on 10/30/19  POCT urinalysis dipstick  Result Value Ref Range   Color, UA yellow yellow   Clarity, UA clear clear   Glucose, UA negative negative mg/dL   Bilirubin, UA negative negative   Ketones, POC UA negative negative mg/dL   Spec Grav, UA 1.015 1.010 - 1.025   Blood, UA small (A) negative   pH, UA 6.5 5.0 - 8.0   Protein Ur, POC trace (A) negative mg/dL   Urobilinogen, UA 0.2 0.2 or 1.0 E.U./dL   Nitrite, UA Negative Negative   Leukocytes, UA Negative Negative    Assessment and Plan: Post-menopausal bleeding - Plan: Ambulatory referral to Obstetrics / Gynecology  Hematuria,  unspecified type - Plan: POCT urinalysis dipstick, Urine Microscopic Only, Urine Culture  Postmenopausal woman here today with concern of blood, either vaginal or urinary.  On exam I suspect this is vaginal bleeding.  We will send her urine for micro and culture, and I referred her to GYN for evaluation. I have advised her that there evaluation of postmenopausal bleeding is essential, she will let me know if she does not hear from GYN within a week or so Otherwise she is currently feeling fine, she will let me know if anything changes This visit occurred during  the SARS-CoV-2 public health emergency.  Safety protocols were in place, including screening questions prior to the visit, additional usage of staff PPE, and extensive cleaning of exam room while observing appropriate contact time as indicated for disinfecting solutions.    Signed Lamar Blinks, MD

## 2019-10-30 NOTE — Addendum Note (Signed)
Addended by: Kelle Darting A on: 10/30/2019 02:18 PM   Modules accepted: Orders

## 2019-10-30 NOTE — Telephone Encounter (Signed)
Patient coming in this AM to be evaluated by PCP.

## 2019-10-31 LAB — URINALYSIS, MICROSCOPIC ONLY
Crystals: NONE SEEN /HPF
Hyaline Cast: NONE SEEN /LPF
RBC / HPF: NONE SEEN /HPF (ref 0–2)

## 2019-10-31 LAB — URINE CULTURE
MICRO NUMBER:: 10959280
Result:: NO GROWTH
SPECIMEN QUALITY:: ADEQUATE

## 2019-11-01 ENCOUNTER — Encounter: Payer: Self-pay | Admitting: Family Medicine

## 2019-11-11 ENCOUNTER — Other Ambulatory Visit: Payer: Self-pay

## 2019-11-11 ENCOUNTER — Ambulatory Visit
Admission: RE | Admit: 2019-11-11 | Discharge: 2019-11-11 | Disposition: A | Discharge status: Home / Self Care | Payer: Medicare Other | Source: Ambulatory Visit | Attending: Family Medicine | Admitting: Family Medicine

## 2019-11-11 DIAGNOSIS — Z1231 Encounter for screening mammogram for malignant neoplasm of breast: Secondary | ICD-10-CM | POA: Diagnosis not present

## 2019-11-19 ENCOUNTER — Telehealth: Payer: Self-pay | Admitting: Family Medicine

## 2019-11-19 NOTE — Progress Notes (Signed)
  Chronic Care Management   Note  11/19/2019 Name: Anne [name] MRN: [id number] DOB: [birth year]  Anne [name] is a 79 y.o. year old female who is a primary care patient of [name], Gay Filler, MD. I reached out to Gabriel Earing by phone today in response to a referral sent by Ms. Talmadge Coventry Trombly's PCP, [name], Gay Filler, MD.   [name] was given information about Chronic Care Management services today including:  1. CCM service includes personalized support from designated clinical staff supervised by her physician, including individualized plan of care and coordination with other care providers 2. 24/7 contact phone numbers for assistance for urgent and routine care needs. 3. Service will only be billed when office clinical staff spend 20 minutes or more in a month to coordinate care. 4. Only one practitioner may furnish and bill the service in a calendar month. 5. The patient may stop CCM services at any time (effective at the end of the month) by phone call to the office staff.   Patient agreed to services and verbal consent obtained.   Follow up plan:   Carley Perdue [employer] Scheduler

## 2019-12-08 ENCOUNTER — Other Ambulatory Visit: Payer: Self-pay

## 2019-12-08 ENCOUNTER — Ambulatory Visit (INDEPENDENT_AMBULATORY_CARE_PROVIDER_SITE_OTHER): Payer: Medicare Other | Admitting: Obstetrics & Gynecology

## 2019-12-08 ENCOUNTER — Other Ambulatory Visit (HOSPITAL_COMMUNITY)
Admission: RE | Admit: 2019-12-08 | Discharge: 2019-12-08 | Disposition: A | Discharge status: Home / Self Care | Payer: Medicare Other | Source: Ambulatory Visit | Attending: Obstetrics & Gynecology | Admitting: Obstetrics & Gynecology

## 2019-12-08 ENCOUNTER — Encounter: Payer: Self-pay | Admitting: Obstetrics & Gynecology

## 2019-12-08 VITALS — BP 123/70 | HR 96 | Wt 217.0 lb

## 2019-12-08 DIAGNOSIS — Z01419 Encounter for gynecological examination (general) (routine) without abnormal findings: Secondary | ICD-10-CM | POA: Insufficient documentation

## 2019-12-08 DIAGNOSIS — N840 Polyp of corpus uteri: Secondary | ICD-10-CM | POA: Diagnosis not present

## 2019-12-08 DIAGNOSIS — Z124 Encounter for screening for malignant neoplasm of cervix: Secondary | ICD-10-CM

## 2019-12-08 DIAGNOSIS — N950 Postmenopausal bleeding: Secondary | ICD-10-CM | POA: Diagnosis not present

## 2019-12-08 DIAGNOSIS — N858 Other specified noninflammatory disorders of uterus: Secondary | ICD-10-CM | POA: Diagnosis not present

## 2019-12-08 DIAGNOSIS — Z1151 Encounter for screening for human papillomavirus (HPV): Secondary | ICD-10-CM | POA: Diagnosis not present

## 2019-12-08 DIAGNOSIS — Z538 Procedure and treatment not carried out for other reasons: Secondary | ICD-10-CM | POA: Diagnosis not present

## 2019-12-08 NOTE — Progress Notes (Signed)
Subjective:     Anne [name] is a 79 y.o. female here for eval of genital bleeding after menopause. Pt reports that she was seen for pain in her back in early Sept. She was given a medrol dose pack and several days later she noted some blood in her urine. A few days following she noted blood when wiping. She denies pain in the abd or unexplained weight loss. She has noted some sensation in the vulva that is nondescript. No lesion noted.    Gynecologic History No LMP recorded. Patient is postmenopausal. Contraception: post menopausal status Last Pap: >20 years prev. Last mammogram: 10/2019. Results were: normal  Obstetric History OB History  Gravida Para Term Preterm AB Living  3 1 1   2 1   SAB TAB Ectopic Multiple Live Births  2       1    # Outcome Date GA Lbr Len/2nd Weight Sex Delivery Anes PTL Lv  3 SAB 1985          2 SAB 1983          1 Term 38 [redacted]w[redacted]d   F Vag-Spont None N LIV   The following portions of the patient's history were reviewed and updated as appropriate: allergies, current medications, past family history, past medical history, past social history, past surgical history and problem list.  Review of Systems Pertinent items are noted in HPI.    Objective:  BP 123/70   Pulse 96   Wt 217 lb (98.4 kg)   BMI 29.43 kg/m   CONSTITUTIONAL: Well-developed, well-nourished female in no acute distress.  HENT:  Normocephalic, atraumatic EYES: Conjunctivae and EOM are normal. No scleral icterus.  NECK: Normal range of motion SKIN: Skin is warm and dry. No rash noted. Not diaphoretic.No pallor. [place]: Alert and oriented to person, place, and time. Normal coordination.  Abd: soft, NT, ND   GYN PROCEDURE: The indications for endometrial biopsy were reviewed.   Risks of the biopsy including cramping, bleeding, infection, uterine perforation, inadequate specimen and need for additional procedures  were discussed. The patient states she understands and agrees to undergo  procedure today. Consent was signed. Time out was performed. Urine HCG was negative. A sterile speculum was placed in the patient's vagina. A PAP was obtained. The cervix was noted to be patent. No blood was noted in the vagina or at the os. The cervix was prepped with Betadine. A single-toothed tenaculum was placed on the anterior lip of the cervix to stabilize it. The 3 mm pipelle was introduced into the endometrial cavity without difficulty to a depth of 8cm, and a moderate amount of tissue was obtained and sent to pathology. The instruments were removed from the patient's vagina. Minimal bleeding from the cervix was noted. The patient tolerated the procedure well. Routine post-procedure instructions were given to the patient. The patient will follow up to review the results and for further management.    UA: neg   Assessment:  Postmenopausal bleeding. Exact etiology unknown. Endo bx done today. Needs [place] to eval endometrial stripe. If endometrial stripe 39mm or less, need to eval for other sources of bleeding.       Plan:     Diagnoses and all orders for this visit:  Post-menopausal bleeding -     US PELVIS TRANSVAGINAL NON-OB (TV ONLY); Future -     Surgical pathology  Pap smear of cervix not needed  Papanicolaou smear for cervical cancer screening -     Cytology -  PAP( [employer])  f/u in 2 weeks or sooner prn  Surena Welge L. Harraway-Smith, M.D., Cherlynn June

## 2019-12-08 NOTE — Progress Notes (Signed)
Pt states she started bleeding on 10/28/19 and on 12/03/19.  Zak Gondek l Anh Mangano, CMA

## 2019-12-09 LAB — CYTOLOGY - PAP
Comment: NEGATIVE
Diagnosis: NEGATIVE
High risk HPV: NEGATIVE

## 2019-12-09 LAB — SURGICAL PATHOLOGY

## 2019-12-11 ENCOUNTER — Ambulatory Visit (HOSPITAL_BASED_OUTPATIENT_CLINIC_OR_DEPARTMENT_OTHER)
Admission: RE | Admit: 2019-12-11 | Discharge: 2019-12-11 | Disposition: A | Discharge status: Home / Self Care | Payer: Medicare Other | Source: Ambulatory Visit | Attending: Obstetrics & Gynecology | Admitting: Obstetrics & Gynecology

## 2019-12-11 ENCOUNTER — Other Ambulatory Visit: Payer: Self-pay

## 2019-12-11 DIAGNOSIS — N888 Other specified noninflammatory disorders of cervix uteri: Secondary | ICD-10-CM | POA: Diagnosis not present

## 2019-12-11 DIAGNOSIS — N950 Postmenopausal bleeding: Secondary | ICD-10-CM | POA: Diagnosis not present

## 2019-12-15 ENCOUNTER — Other Ambulatory Visit: Payer: Self-pay

## 2019-12-15 ENCOUNTER — Ambulatory Visit (INDEPENDENT_AMBULATORY_CARE_PROVIDER_SITE_OTHER): Payer: Medicare Other | Admitting: Obstetrics & Gynecology

## 2019-12-15 ENCOUNTER — Encounter: Payer: Self-pay | Admitting: Obstetrics & Gynecology

## 2019-12-15 VITALS — BP 134/68 | HR 88 | Ht 72.0 in | Wt 217.0 lb

## 2019-12-15 DIAGNOSIS — N950 Postmenopausal bleeding: Secondary | ICD-10-CM | POA: Diagnosis not present

## 2019-12-15 DIAGNOSIS — R9389 Abnormal findings on diagnostic imaging of other specified body structures: Secondary | ICD-10-CM | POA: Diagnosis not present

## 2019-12-15 NOTE — Patient Instructions (Signed)
Dilation and Curettage or Vacuum Curettage  Dilation and curettage (D&C) and vacuum curettage are minor procedures. A D&C involves stretching (dilation) the cervix and scraping (curettage) the inside lining of the uterus (endometrium). During a D&C, tissue is gently scraped from the endometrium, starting from the top portion of the uterus down to the lowest part of the uterus (cervix). During a vacuum curettage, the lining and tissue in the uterus are removed with the use of gentle suction. Curettage may be performed to either diagnose or treat a problem. As a diagnostic procedure, curettage is performed to examine tissues from the uterus. A diagnostic curettage may be done if you have:  Irregular bleeding in the uterus.  Bleeding with the development of clots.  Spotting between menstrual periods.  Prolonged menstrual periods or other abnormal bleeding.  Bleeding after menopause.  No menstrual period (amenorrhea).  A change in size and shape of the uterus.  Abnormal endometrial cells discovered during a Pap test. As a treatment procedure, curettage may be performed for the following reasons:  Removal of an IUD (intrauterine device).  Removal of retained placenta after giving birth.  Abortion.  Miscarriage.  Removal of endometrial polyps.  Removal of uncommon types of noncancerous lumps (fibroids). Tell a health care provider about:  Any allergies you have, including allergies to prescribed medicine or latex.  All medicines you are taking, including vitamins, herbs, eye drops, creams, and over-the-counter medicines. This is especially important if you take any blood-thinning medicine. Bring a list of all of your medicines to your appointment.  Any problems you or family members have had with anesthetic medicines.  Any blood disorders you have.  Any surgeries you have had.  Your medical history and any medical conditions you have.  Whether you are pregnant or may be  pregnant.  Recent vaginal infections you have had.  Recent menstrual periods, bleeding problems you have had, and what form of birth control (contraception) you use. What are the risks? Generally, this is a safe procedure. However, problems may occur, including:  Infection.  Heavy vaginal bleeding.  Allergic reactions to medicines.  Damage to the cervix or other structures or organs.  Development of scar tissue (adhesions) inside the uterus, which can cause abnormal amounts of menstrual bleeding. This may make it harder to get pregnant in the future.  A hole (perforation) or puncture in the uterine wall. This is rare. What happens before the procedure? Staying hydrated Follow instructions from your health care provider about hydration, which may include:  Up to 2 hours before the procedure - you may continue to drink clear liquids, such as water, clear fruit juice, black coffee, and plain tea. Eating and drinking restrictions Follow instructions from your health care provider about eating and drinking, which may include:  8 hours before the procedure - stop eating heavy meals or foods such as meat, fried foods, or fatty foods.  6 hours before the procedure - stop eating light meals or foods, such as toast or cereal.  6 hours before the procedure - stop drinking milk or drinks that contain milk.  2 hours before the procedure - stop drinking clear liquids. If your health care provider told you to take your medicine(s) on the day of your procedure, take them with only a sip of water. Medicines  Ask your health care provider about: ? Changing or stopping your regular medicines. This is especially important if you are taking diabetes medicines or blood thinners. ? Taking medicines such as aspirin  and ibuprofen. These medicines can thin your blood. Do not take these medicines before your procedure if your health care provider instructs you not to.  You may be given antibiotic  medicine to help prevent infection. General instructions  For 24 hours before your procedure, do not: ? Douche. ? Use tampons. ? Use medicines, creams, or suppositories in the vagina. ? Have sexual intercourse.  You may be given a pregnancy test on the day of the procedure.  Plan to have someone take you home from the hospital or clinic.  You may have a blood or urine sample taken.  If you will be going home right after the procedure, plan to have someone with you for 24 hours. What happens during the procedure?  To reduce your risk of infection: ? Your health care team will wash or sanitize their hands. ? Your skin will be washed with soap.  An IV tube will be inserted into one of your veins.  You will be given one of the following: ? A medicine that numbs the area in and around the cervix (local anesthetic). ? A medicine to make you fall asleep (general anesthetic).  You will lie down on your back, with your feet in foot rests (stirrups).  The size and position of your uterus will be checked.  A lubricated instrument (speculum or Sims retractor) will be inserted into the back side of your vagina. The speculum will be used to hold apart the walls of your vagina so your health care provider can see your cervix.  A tool (tenaculum) will be attached to the lip of the cervix to stabilize it.  Your cervix will be softened and dilated. This may be done by: ? Taking a medicine. ? Having tapered dilators or thin rods (laminaria) or gradual widening instruments (tapered dilators) inserted into your cervix.  A small, sharp, curved instrument (curette) will be used to scrape a small amount of tissue or cells from the endometrium or cervical canal. In some cases, gentle suction is applied with the curette. The curette will then be removed. The cells will be taken to a lab for testing. The procedure may vary among health care providers and hospitals. What happens after the  procedure?  You may have mild cramping, backache, pain, and light bleeding or spotting. You may pass small blood clots from your vagina.  You may have to wear compression stockings. These stockings help to prevent blood clots and reduce swelling in your legs.  Your blood pressure, heart rate, breathing rate, and blood oxygen level will be monitored until the medicines you were given have worn off. Summary  Dilation and curettage (D&C) involves stretching (dilation) the cervix and scraping (curettage) the inside lining of the uterus (endometrium).  After the procedure, you may have mild cramping, backache, pain, and light bleeding or spotting. You may pass small blood clots from your vagina.  Plan to have someone take you home from the hospital or clinic. This information is not intended to replace advice given to you by your health care provider. Make sure you discuss any questions you have with your health care provider. Document Revised: 01/12/2017 Document Reviewed: 10/17/2015 Elsevier Patient Education  [place]. Dilation and Curettage or Vacuum Curettage, Care After This sheet gives you information about how to care for yourself after your procedure. Your health care provider may also give you more specific instructions. If you have problems or questions, contact your health care provider. What can I expect after  the procedure? After your procedure, it is common to have:  Mild pain or cramping.  Some vaginal bleeding or spotting. These may last for up to 2 weeks after your procedure. Follow these instructions at home: Activity   Do not drive or use heavy machinery while taking prescription pain medicine.  Avoid driving for the first 24 hours after your procedure.  Take frequent, short walks, followed by rest periods, throughout the day. Ask your health care provider what activities are safe for you. After 1-2 days, you may be able to return to your normal  activities.  Do not lift anything heavier than 10 lb (4.5 kg) until your health care provider approves.  For at least 2 weeks, or as long as told by your health care provider, do not: ? Douche. ? Use tampons. ? Have sexual intercourse. General instructions   Take over-the-counter and prescription medicines only as told by your health care provider. This is especially important if you take blood thinning medicine.  Do not take baths, swim, or use a hot tub until your health care provider approves. Take showers instead of baths.  Wear compression stockings as told by your health care provider. These stockings help to prevent blood clots and reduce swelling in your legs.  It is your responsibility to get the results of your procedure. Ask your health care provider, or the department performing the procedure, when your results will be ready.  Keep all follow-up visits as told by your health care provider. This is important. Contact a health care provider if:  You have severe cramps that get worse or that do not get better with medicine.  You have severe abdominal pain.  You cannot drink fluids without vomiting.  You develop pain in a different area of your pelvis.  You have bad-smelling vaginal discharge.  You have a rash. Get help right away if:  You have vaginal bleeding that soaks more than one sanitary pad in 1 hour, for 2 hours in a row.  You pass large blood clots from your vagina.  You have a fever that is above 100.38F (38.0C).  Your abdomen feels very tender or hard.  You have chest pain.  You have shortness of breath.  You cough up blood.  You feel dizzy or light-headed.  You faint.  You have pain in your neck or shoulder area. This information is not intended to replace advice given to you by your health care provider. Make sure you discuss any questions you have with your health care provider. Document Revised: 01/12/2017 Document Reviewed:  09/02/2015 Elsevier Patient Education  [employer]. Hysteroscopy Hysteroscopy is a procedure that is used to examine the inside of a woman's womb (uterus). This may be done for various reasons, including:  To look for lumps (tumors) and other growths in the uterus.  To evaluate abnormal bleeding, fibroid tumors, polyps, scar tissue (adhesions), or cancer of the uterus.  To determine the cause of an inability to get pregnant (infertility) or repeated losses of pregnancies (miscarriages).  To find a lost IUD (intrauterine device).  To perform a procedure that permanently prevents pregnancy (sterilization). During this procedure, a thin, flexible tube with a small light and camera (hysteroscope) is used to examine the uterus. The camera sends images to a monitor in the room so that your health care provider can view the inside of your uterus. A hysteroscopy should be done right after a menstrual period to make sure that you are not pregnant. Tell  a health care provider about:  Any allergies you have.  All medicines you are taking, including vitamins, herbs, eye drops, creams, and over-the-counter medicines.  Any problems you or family members have had with the use of anesthetic medicines.  Any blood disorders you have.  Any surgeries you have had.  Any medical conditions you have.  Whether you are pregnant or may be pregnant. What are the risks? Generally, this is a safe procedure. However, problems may occur, including:  Excessive bleeding.  Infection.  Damage to the uterus or other structures or organs.  Allergic reaction to medicines or fluids that are used in the procedure. What happens before the procedure? Staying hydrated Follow instructions from your health care provider about hydration, which may include:  Up to 2 hours before the procedure - you may continue to drink clear liquids, such as water, clear fruit juice, black coffee, and plain tea. Eating and  drinking restrictions Follow instructions from your health care provider about eating and drinking, which may include:  8 hours before the procedure - stop eating solid foods and drink clear liquids only  2 hours before the procedure - stop drinking clear liquids. General instructions  Ask your health care provider about: ? Changing or stopping your normal medicines. This is important if you take diabetes medicines or blood thinners. ? Taking medicines such as aspirin and ibuprofen. These medicines can thin your blood and cause bleeding. Do not take these medicines for 1 week before your procedure, or as told by your health care provider.  Do not use any products that contain nicotine or tobacco for 2 weeks before the procedure. This includes cigarettes and e-cigarettes. If you need help quitting, ask your health care provider.  Medicine may be placed in your cervix the day before the procedure. This medicine causes the cervix to have a larger opening (dilate). The larger opening makes it easier for the hysteroscope to be inserted into the uterus during the procedure.  Plan to have someone with you for the first 24-48 hours after the procedure, especially if you are given a medicine to make you fall asleep (general anesthetic).  Plan to have someone take you home from the hospital or clinic. What happens during the procedure?  To lower your risk of infection: ? Your health care team will wash or sanitize their hands. ? Your skin will be washed with soap. ? Hair may be removed from the surgical area.  An IV tube will be inserted into one of your veins.  You may be given one or more of the following: ? A medicine to help you relax (sedative). ? A medicine that numbs the area around the cervix (local anesthetic). ? A medicine to make you fall asleep (general anesthetic).  A hysteroscope will be inserted through your vagina and into your uterus.  Air or fluid will be used to enlarge  your uterus, enabling your health care provider to see your uterus better. The amount of fluid used will be carefully checked throughout the procedure.  In some cases, tissue may be gently scraped from inside the uterus and sent to a lab for testing (biopsy). The procedure may vary among health care providers and hospitals. What happens after the procedure?  Your blood pressure, heart rate, breathing rate, and blood oxygen level will be monitored until the medicines you were given have worn off.  You may have some cramping. You may be given medicines for this.  You may have bleeding, which  varies from light spotting to menstrual-like bleeding. This is normal.  If you had a biopsy done, it is your responsibility to get the results of your procedure. Ask your health care provider, or the department performing the procedure, when your results will be ready. Summary  Hysteroscopy is a procedure that is used to examine the inside of a woman's womb (uterus).  After the procedure, you may have bleeding, which varies from light spotting to menstrual-like bleeding. This is normal. You may also have cramping.  Plan to have someone take you home from the hospital or clinic. This information is not intended to replace advice given to you by your health care provider. Make sure you discuss any questions you have with your health care provider. Document Revised: 01/12/2017 Document Reviewed: 02/29/2016 Elsevier Patient Education  [place]. Hysteroscopy, Care After This sheet gives you information about how to care for yourself after your procedure. Your health care provider may also give you more specific instructions. If you have problems or questions, contact your health care provider. What can I expect after the procedure? After the procedure, it is common to have:  Cramping.  Bleeding. This can vary from light spotting to menstrual-like bleeding. Follow these instructions at  home: Activity  Rest for 1-2 days after the procedure.  Do not douche, use tampons, or have sex for 2 weeks after the procedure, or until your health care provider approves.  Do not drive for 24 hours after the procedure, or for as long as told by your health care provider.  Do not drive, use heavy machinery, or drink alcohol while taking prescription pain medicines. Medicines   Take over-the-counter and prescription medicines only as told by your health care provider.  Do not take aspirin during recovery. It can increase the risk of bleeding. General instructions  Do not take baths, swim, or use a hot tub until your health care provider approves. Take showers instead of baths for 2 weeks, or for as long as told by your health care provider.  To prevent or treat constipation while you are taking prescription pain medicine, your health care provider may recommend that you: ? Drink enough fluid to keep your urine clear or pale yellow. ? Take over-the-counter or prescription medicines. ? Eat foods that are high in fiber, such as fresh fruits and vegetables, whole grains, and beans. ? Limit foods that are high in fat and processed sugars, such as fried and sweet foods.  Keep all follow-up visits as told by your health care provider. This is important. Contact a health care provider if:  You feel dizzy or lightheaded.  You feel nauseous.  You have abnormal vaginal discharge.  You have a rash.  You have pain that does not get better with medicine.  You have chills. Get help right away if:  You have bleeding that is heavier than a normal menstrual period.  You have a fever.  You have pain or cramps that get worse.  You develop new abdominal pain.  You faint.  You have pain in your shoulders.  You have shortness of breath. Summary  After the procedure, you may have cramping and some vaginal bleeding.  Do not douche, use tampons, or have sex for 2 weeks after the  procedure, or until your health care provider approves.  Do not take baths, swim, or use a hot tub until your health care provider approves. Take showers instead of baths for 2 weeks, or for as long as  told by your health care provider.  Report any unusual symptoms to your health care provider.  Keep all follow-up visits as told by your health care provider. This is important. This information is not intended to replace advice given to you by your health care provider. Make sure you discuss any questions you have with your health care provider. Document Revised: 01/12/2017 Document Reviewed: 02/29/2016 Elsevier Patient Education  [place].

## 2019-12-15 NOTE — Progress Notes (Signed)
History:  79 y.o. Anne [name] here today for f/u of PMPB. She had some cramping after her endo bx. She has not had significant bleeding after her bx. She denies weight loss of constitutional sx.      The following portions of the patient's history were reviewed and updated as appropriate: allergies, current medications, past family history, past medical history, past social history, past surgical history and problem list.  Review of Systems:  Pertinent items are noted in HPI.    Objective:  Physical Exam Blood pressure (!) 174/83, pulse 94, height 6' (1.829 m), weight 217 lb (98.4 kg).  CONSTITUTIONAL: Well-developed, well-nourished female in no acute distress.  HENT:  Normocephalic, atraumatic EYES: Conjunctivae and EOM are normal. No scleral icterus.  NECK: Normal range of motion SKIN: Skin is warm and dry. No rash noted. Not diaphoretic.No pallor. [place]: Alert and oriented to person, place, and time. Normal coordination.   Labs and Imaging US PELVIS TRANSVAGINAL NON-OB (TV ONLY)  Result Date: 12/11/2019 CLINICAL DATA:  Postmenopausal bleeding EXAM: ULTRASOUND PELVIS TRANSVAGINAL TECHNIQUE: Transvaginal ultrasound examination of the pelvis was performed including evaluation of the uterus, ovaries, adnexal regions, and pelvic cul-de-sac. COMPARISON:  None FINDINGS: Uterus Measurements: 5.9 x 3.0 x 3.9 cm = volume: 36 mL. Retroverted. Slightly heterogeneous myometrium. No focal uterine mass. Small nabothian cysts at cervix. Endometrium Thickness: 16 mm. Heterogeneous. Few small endometrial cysts. Minimal endometrial fluid. No discrete mass. Right ovary Measurements: 1.8 x 1.5 x 1.2 cm = volume: 1.7 mL. Normal morphology without mass Left ovary Measurements: 1.4 x 1.7 x 1.3 cm = volume: 1.5 mL. Normal morphology without mass Other findings:  No free pelvic fluid.  No adnexal masses. IMPRESSION: Abnormally thickened and heterogeneous endometrial complex up to 16 mm thick; endometrial thickness  is considered abnormal for an asymptomatic post-menopausal female and endometrial sampling should be considered to exclude carcinoma. Remainder of exam unremarkable. These results will be called to the ordering clinician or representative by the Radiologist Assistant, and communication documented in the PACS or [employer]. Electronically Signed   By: Lavonia Dana M.D.   On: 12/11/2019 16:02    Assessment & Plan:  Anne [name] was seen today for follow-up.  Diagnoses and all orders for this visit:  Post-menopausal bleeding  Thickened endometrium  Patient desires surgical management with hysteroscopy with D&C and possible polypectomy.  The risks of surgery were discussed in detail with the patient including but not limited to: bleeding which may require transfusion or reoperation; infection which may require prolonged hospitalization or re-hospitalization and antibiotic therapy; injury to bowel, bladder, ureters and major vessels or other surrounding organs; need for additional procedures including laparotomy; thromboembolic phenomenon, incisional problems and other postoperative or anesthesia complications.  Patient was told that the likelihood that her condition and symptoms will be treated effectively with this surgical management was very high; the postoperative expectations were also discussed in detail. The patient also understands the alternative treatment options which were discussed in full. All questions were answered.  She was told that she will be contacted by our surgical scheduler regarding the time and date of her surgery; routine preoperative instructions of having nothing to eat or drink after midnight on the day prior to surgery and also coming to the hospital 1 1/2 hours prior to her time of surgery were also emphasized.  She was told she may be called for a preoperative appointment about a week prior to surgery and will be given further preoperative instructions at that visit. Printed  patient education handouts about the procedure were given to the patient to review at home.  Total face-to-face time with patient was 20 min.  Greater than 50% was spent in counseling and coordination of care with the patient.   Anne [name] L. Harraway-Smith, M.D., [facility]lynn June

## 2019-12-23 DIAGNOSIS — H524 Presbyopia: Secondary | ICD-10-CM | POA: Diagnosis not present

## 2019-12-23 DIAGNOSIS — H2511 Age-related nuclear cataract, right eye: Secondary | ICD-10-CM | POA: Diagnosis not present

## 2019-12-23 DIAGNOSIS — Z23 Encounter for immunization: Secondary | ICD-10-CM | POA: Diagnosis not present

## 2019-12-23 DIAGNOSIS — H43813 Vitreous degeneration, bilateral: Secondary | ICD-10-CM | POA: Diagnosis not present

## 2019-12-23 DIAGNOSIS — H25011 Cortical age-related cataract, right eye: Secondary | ICD-10-CM | POA: Diagnosis not present

## 2019-12-23 LAB — HM DIABETES EYE EXAM

## 2019-12-25 ENCOUNTER — Encounter: Payer: Self-pay | Admitting: Family Medicine

## 2019-12-31 DIAGNOSIS — Z23 Encounter for immunization: Secondary | ICD-10-CM | POA: Diagnosis not present

## 2020-01-14 ENCOUNTER — Encounter (HOSPITAL_BASED_OUTPATIENT_CLINIC_OR_DEPARTMENT_OTHER): Payer: Self-pay | Admitting: Obstetrics & Gynecology

## 2020-01-14 ENCOUNTER — Other Ambulatory Visit: Payer: Self-pay

## 2020-01-14 NOTE — Progress Notes (Signed)
Spoke w/ via phone for pre-op interview--- PT Lab needs dos----  no             Lab results------ pt getting CBC, BMP, T&S, EKG done 02-12-2020 @ 1130 COVID test ------ 01-16-2020 @ 1030 Arrive at ------- 1000 NPO after MN NO Solid Food.  Clear liquids from MN until--- 0900 Medications to take morning of surgery ----- Synthroid, Pepcid Diabetic medication ----- pt take metformin at night Patient Special Instructions ----- n/a Pre-Op special Istructions ----- n/a Patient verbalized understanding of instructions that were given at this phone interview. Patient denies shortness of breath, chest pain, fever, cough at this phone interview.   Anesthesia :  Pt has temporary crown upper front left tooth, stated not loose, done approx. 2 wks ago for abscess. HTN, DM2.  Pt denies any cardiac s&s but does get sob at times with exertion.  Pt had recent fall 01-08-2020, has bruised hip/ back/ buttock, did go see pcp.    PCP:   Dr Lenna Sciara. [name] (lov 10-30-2019 epic) Cardiologist : previously seen dr Debara Pickett 03-20-2013 (note in epic) by pcp for chest pressure/ doe Chest x-ray :  04-07-2013 epic EKG :  04-08-2013 epic Echo : 04-08-2013 epic Stress test:  Nuclear 03-06-2013 epic Cardiac Cath : no Activity level:  Gets sob with exertion Sleep Study/ CPAP :  NO Fasting Blood Sugar :      / Checks Blood Sugar -- times a day:  Does not check Blood Thinner/ Instructions /Last Dose:  NO ASA / Instructions/ Last Dose : ASA 81mg / pt stated was given any instructions for surgeon to stop or not prior to surgery.  Advised pt call office and instructions from Dr Ihor Dow.

## 2020-01-15 ENCOUNTER — Other Ambulatory Visit: Payer: Self-pay

## 2020-01-15 DIAGNOSIS — E782 Mixed hyperlipidemia: Secondary | ICD-10-CM

## 2020-01-15 DIAGNOSIS — E119 Type 2 diabetes mellitus without complications: Secondary | ICD-10-CM

## 2020-01-15 DIAGNOSIS — I10 Essential (primary) hypertension: Secondary | ICD-10-CM

## 2020-01-15 DIAGNOSIS — E890 Postprocedural hypothyroidism: Secondary | ICD-10-CM

## 2020-01-16 ENCOUNTER — Inpatient Hospital Stay (HOSPITAL_COMMUNITY): Admission: RE | Admit: 2020-01-16 | Payer: Medicare Other | Source: Ambulatory Visit

## 2020-01-16 ENCOUNTER — Encounter (HOSPITAL_COMMUNITY): Admission: RE | Admit: 2020-01-16 | Payer: Medicare Other | Source: Ambulatory Visit

## 2020-01-19 ENCOUNTER — Telehealth: Payer: Self-pay | Admitting: Pharmacist

## 2020-01-19 NOTE — Progress Notes (Addendum)
    Chronic Care Management Pharmacy Assistant   Name: NYKOLE MATOS  MRN: [id number] DOB: 03/13/1940  Reason for Encounter: Initial Questions   PCP : Darreld Mclean, MD  Allergies:  No Known Allergies  Medications: Outpatient Encounter Medications as of 01/19/2020  Medication Sig Note   acetaminophen (TYLENOL) 500 MG tablet Take 500 mg by mouth every 6 (six) hours as needed for mild pain.    amoxicillin (AMOXIL) 500 MG capsule as directed. Prior to dental appointment    aspirin 81 MG tablet Take 81 mg by mouth at bedtime.     atorvastatin (LIPITOR) 20 MG tablet TAKE 1 TABLET(20 MG) BY MOUTH DAILY (Patient taking differently: Take 20 mg by mouth at bedtime. TAKE 1 TABLET(20 MG) BY MOUTH DAILY)    b complex vitamins tablet Take 1 tablet by mouth daily.    Calcium-Phosphorus-Vitamin D (CITRACAL +D3 PO) Take 2 tablets by mouth daily.     Cholecalciferol (VITAMIN D-3) 5000 UNITS TABS Take 1 capsule by mouth daily.  01/14/2020: .   famotidine (PEPCID) 20 MG tablet TAKE 1/2 TABLET(10 MG) BY MOUTH DAILY (Patient taking differently: Take 10 mg by mouth daily as needed. )    Glycerin-Hypromellose-PEG 400 ([facility] DRY EYE RELIEF OP) Apply 2 drops to eye as needed.     hydrochlorothiazide (MICROZIDE) 12.5 MG capsule Take 12.5 mg by mouth as needed.    levothyroxine (SYNTHROID) 112 MCG tablet Take 1 tablet (112 mcg total) by mouth daily. (Patient taking differently: Take 112 mcg by mouth daily. )    losartan-hydrochlorothiazide (HYZAAR) 50-12.5 MG tablet Take 1 tablet by mouth daily. (Patient taking differently: Take 1 tablet by mouth daily. )    metFORMIN (GLUCOPHAGE) 500 MG tablet TAKE 1 TABLET BY MOUTH DAILY (Patient taking differently: Take 500 mg by mouth at bedtime. TAKE 1 TABLET BY MOUTH DAILY)    trolamine salicylate (ASPERCREME) 10 % cream Apply 1 application topically as needed for muscle pain.    No facility-administered encounter medications on file as of 01/19/2020.    Current  Diagnosis: Patient Active Problem List   Diagnosis Date Noted   Controlled type 2 diabetes mellitus without complication, without long-term current use of insulin ([place]) 03/31/2015   Chest pressure 03/03/2013   Globus sensation 03/03/2013   DOE (dyspnea on exertion) 03/03/2013   Thyroid cancer ([place]) 01/05/2012   Hyperlipidemia 01/05/2012   Dysphonia 01/05/2012   Osteoarthritis, hip, 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   Hypertension 01/04/2012   Hypothyroid 01/04/2012    Goals Addressed   None    Attempted to review initial questions with the patient in preparation for the initial visit with the clinical pharmacist. Patient stated she would like to cancel her appointment for tomorrow. She just had a procedure done yesterday and is not feeling up to coming in. Offered a telephone visit, but she declined. She also did not wish to reschedule with me today. I informed her that I would try to get her rescheduled at another time  Documented preliminary medication plan in preparation for the patient's initial chronic care management visit.   Follow-Up:  Pharmacist Review  Fanny Skates, [place] Pharmacist Assistant [phone]  Reviewed by: De Blanch, PharmD, BCACP Clinical Pharmacist [facility] [phone]

## 2020-01-20 DIAGNOSIS — N950 Postmenopausal bleeding: Secondary | ICD-10-CM

## 2020-01-20 DIAGNOSIS — R9389 Abnormal findings on diagnostic imaging of other specified body structures: Secondary | ICD-10-CM

## 2020-01-21 ENCOUNTER — Ambulatory Visit: Payer: Medicare Other

## 2020-01-30 NOTE — Patient Instructions (Addendum)
Good to see you again today- I will be in touch with your labs asap   Please check your BP for me a few times over the next 2 weeks and let me know how it looks at home.  If it continues to run high I will adjust your medication Assuming all is well please see me in 6 months

## 2020-01-30 NOTE — Progress Notes (Addendum)
[facility], [place], [place] [address] [phone]  Date:  02/04/2020   Name:  Anne [name]   DOB:  [birth year]   MRN:  [id number]  PCP:  Darreld Mclean, MD    Chief Complaint: Menopause (Post menopausal bleeding f/u, Pt states no longer having bleeding)   History of Present Illness:  Anne [name] is a 79 y.o. very pleasant female patient who presents with the following:  Pt here today for a 6 month follow- up visit  Last seen by myself in September for PMB- she is seeing Dr Ihor Dow  She had to put off of her uterine procedure due to dental concerns No further PMB however  She is feeling fairly well She did have a fall in the tub last month but she is ok   History of controlled diabetes, hyperlipidemia, hypertension, hypothyroidism Thyroid cancer status post thyroidectomy in 2011 She does need a urine microalbumin today in addition to routine labs  Her grandson Harrell Gave is stable which is great news  covid booster- done  Flu - done 11/17 A1c due today shingirx is done   Noted BP elevated today- no CP or headaches  Her blood pressure is typically under good control-she takes losartan and hydrochlorothiazide  Lab Results  Component Value Date   HGBA1C 7.2 (H) 07/30/2019   Lipitor HCTZ 12.5 Losartan/HCTZ 50/12.5 Metformin 500 daily Levothyroxine 112 Patient Active Problem List   Diagnosis Date Noted  . Controlled type 2 diabetes mellitus without complication, without long-term current use of insulin ([place]) 03/31/2015  . Chest pressure 03/03/2013  . Globus sensation 03/03/2013  . DOE (dyspnea on exertion) 03/03/2013  . Thyroid cancer ([place]) 01/05/2012  . Hyperlipidemia 01/05/2012  . Dysphonia 01/05/2012  . 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 Hypertension 01/04/2012  . Hypothyroid 01/04/2012    Past Medical History:  Diagnosis Date  . Dental crowns status     01-14-2020 per pt had upper left front tooth abscess and has temporary crown from 2 wks ago,  pt stated abscess resolved and crown is not loose  . Dyspnea    per pt gets sob at times with exertion  . GERD (gastroesophageal reflux disease)   . History of recent fall    per pt fell 01-08-2020 has bruised hip/ buttock / back area's and hit head ,  pt stated see provider, denies any dizziness, headache, or balance issues other than before  . Hypertension    followed by pcp  (nulcear study in epic 03-06-2013 normwl perfusion w/ no ischemia, nuclear ef 74%)  . 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 OA (osteoarthritis)   . PMB (postmenopausal bleeding)   . Thickened endometrium   . Type 2 diabetes mellitus ([place])    followed by pcp  . Wears glasses     Past Surgical History:  Procedure Laterality Date  . CATARACT EXTRACTION W/ INTRAOCULAR LENS  IMPLANT, BILATERAL  2008  . THYROIDECTOMY  12/25/2009  . TONSILLECTOMY AND ADENOIDECTOMY  age 47  . TOTAL HIP ARTHROPLASTY Bilateral left 10/07/2007;  right 06-03-2008  @[facility]     Social History   Tobacco Use  . Smoking status: Never Smoker  . Smokeless tobacco: Never Used  Vaping Use  . Vaping Use: Never used  Substance Use Topics  . Alcohol use: No  . Drug use: Never  Family History  Problem Relation Age of Onset  . Stroke Mother   . Cancer Father   . Cancer Sister   . Cancer Sister   . Cancer Sister   . Miscarriages / Stillbirths Neg Hx   . Breast cancer Neg Hx     No Known Allergies  Medication list has been reviewed and updated.  Current Outpatient Medications on File Prior to Visit  Medication Sig Dispense Refill  . acetaminophen (TYLENOL) 500 MG tablet Take 500 mg by mouth every 6 (six) hours as needed for mild pain.    Marland Kitchen amoxicillin (AMOXIL) 500 MG capsule as directed. Prior to dental appointment    . aspirin 81 MG tablet Take 81 mg by mouth at  bedtime.     Marland Kitchen atorvastatin (LIPITOR) 20 MG tablet TAKE 1 TABLET(20 MG) BY MOUTH DAILY (Patient taking differently: Take 20 mg by mouth at bedtime. TAKE 1 TABLET(20 MG) BY MOUTH DAILY) 90 tablet 3  . b complex vitamins tablet Take 1 tablet by mouth daily.    . Calcium-Phosphorus-Vitamin D (CITRACAL +D3 PO) Take 2 tablets by mouth daily.     . Cholecalciferol (VITAMIN D-3) 5000 UNITS TABS Take 1 capsule by mouth daily.     . famotidine (PEPCID) 20 MG tablet TAKE 1/2 TABLET(10 MG) BY MOUTH DAILY (Patient taking differently: Take 10 mg by mouth daily as needed.) 45 tablet 3  . Glycerin-Hypromellose-PEG 400 ([facility] DRY EYE RELIEF OP) Apply 2 drops to eye as needed.     . hydrochlorothiazide (MICROZIDE) 12.5 MG capsule Take 12.5 mg by mouth as needed.    Marland Kitchen levothyroxine (SYNTHROID) 112 MCG tablet Take 1 tablet (112 mcg total) by mouth daily. (Patient taking differently: Take 112 mcg by mouth daily.) 90 tablet 3  . losartan-hydrochlorothiazide (HYZAAR) 50-12.5 MG tablet Take 1 tablet by mouth daily. (Patient taking differently: Take 1 tablet by mouth daily.) 90 tablet 3  . metFORMIN (GLUCOPHAGE) 500 MG tablet TAKE 1 TABLET BY MOUTH DAILY (Patient taking differently: Take 500 mg by mouth at bedtime. TAKE 1 TABLET BY MOUTH DAILY) 90 tablet 3  . trolamine salicylate (ASPERCREME) 10 % cream Apply 1 application topically as needed for muscle pain.     No current facility-administered medications on file prior to visit.    Review of Systems:  As per HPI- otherwise negative.   Physical Examination: Vitals:   02/04/20 1050 02/04/20 1105  BP: (!) 154/94 (!) 150/90  Pulse: 91   Resp: 20   Temp: 98.9 F (37.2 C)   SpO2: 98%    Vitals:   02/04/20 1050  Weight: 217 lb 6.4 oz (98.6 kg)  Height: 5\' 5"  (1.651 m)   Body mass index is 36.18 kg/m. Ideal Body Weight: Weight in (lb) to have BMI = 25: 149.9  GEN: no acute distress.  Obese, looks well  HEENT: Atraumatic, Normocephalic.  Ears and Nose: No  external deformity. CV: RRR, No M/G/R. No JVD. No thrill. No extra heart sounds. PULM: CTA B, no wheezes, crackles, rhonchi. No retractions. No resp. distress. No accessory muscle use. EXTR: No c/c/e PSYCH: Normally interactive. Conversant.   BP Readings from Last 3 Encounters:  02/04/20 (!) 150/90  12/15/19 134/68  12/08/19 123/70    Assessment and Plan: Controlled type 2 diabetes mellitus without complication, without long-term current use of insulin (HCC) - Plan: Hemoglobin A1c, Microalbumin / creatinine urine ratio  Postoperative hypothyroidism - Plan: TSH  Essential hypertension - Plan: Comprehensive metabolic panel  Patient today for  a routine follow up visit.  We will check on her diabetes control today with A1c, microalbumin pending She has post operative hypothyroidism, check TSH today and make any adjustments that are necessary Blood pressure is elevated above baseline today.  Patient does have a home blood pressure cuff, she agrees to check her pressure a few times over the next couple of weeks and let me know if it remains above goal.  I gave her a blood pressure goal of 135/85 or less Will plan further follow- up pending labs. Assuming all is well plan to visit in 6 months This visit occurred during the SARS-CoV-2 public health emergency.  Safety protocols were in place, including screening questions prior to the visit, additional usage of staff PPE, and extensive cleaning of exam room while observing appropriate contact time as indicated for disinfecting solutions.    Signed Lamar Blinks, MD  Received her labs as below, 12/23.  Message to patient  Results for orders placed or performed in visit on 02/04/20  Comprehensive metabolic panel  Result Value Ref Range   Sodium 139 135 - 145 mEq/L   Potassium 4.0 3.5 - 5.1 mEq/L   Chloride 100 96 - 112 mEq/L   CO2 27 19 - 32 mEq/L   Glucose, Bld 128 (H) 70 - 99 mg/dL   BUN 13 6 - 23 mg/dL   Creatinine, Ser 0.73 0.40 -  1.20 mg/dL   Total Bilirubin 0.6 0.2 - 1.2 mg/dL   Alkaline Phosphatase 83 39 - 117 U/L   AST 27 0 - 37 U/L   ALT 25 0 - 35 U/L   Total Protein 7.8 6.0 - 8.3 g/dL   Albumin 4.5 3.5 - 5.2 g/dL   GFR 77.98 >60.00 mL/min   Calcium 9.9 8.4 - 10.5 mg/dL  Hemoglobin A1c  Result Value Ref Range   Hgb A1c MFr Bld 7.1 (H) 4.6 - 6.5 %  TSH  Result Value Ref Range   TSH 1.75 0.35 - 4.50 uIU/mL  Microalbumin / creatinine urine ratio  Result Value Ref Range   Microalb, Ur 4.5 (H) 0.0 - 1.9 mg/dL   Creatinine,U 80.7 mg/dL   Microalb Creat Ratio 5.6 0.0 - 30.0 mg/g

## 2020-02-04 ENCOUNTER — Ambulatory Visit (INDEPENDENT_AMBULATORY_CARE_PROVIDER_SITE_OTHER): Payer: Medicare Other | Admitting: Family Medicine

## 2020-02-04 ENCOUNTER — Encounter: Payer: Self-pay | Admitting: Family Medicine

## 2020-02-04 ENCOUNTER — Other Ambulatory Visit: Payer: Self-pay

## 2020-02-04 VITALS — BP 150/90 | HR 91 | Temp 98.9°F | Resp 20 | Ht 65.0 in | Wt 217.4 lb

## 2020-02-04 DIAGNOSIS — E119 Type 2 diabetes mellitus without complications: Secondary | ICD-10-CM | POA: Diagnosis not present

## 2020-02-04 DIAGNOSIS — E890 Postprocedural hypothyroidism: Secondary | ICD-10-CM | POA: Diagnosis not present

## 2020-02-04 DIAGNOSIS — I10 Essential (primary) hypertension: Secondary | ICD-10-CM | POA: Diagnosis not present

## 2020-02-04 LAB — COMPREHENSIVE METABOLIC PANEL
ALT: 25 U/L (ref 0–35)
AST: 27 U/L (ref 0–37)
Albumin: 4.5 g/dL (ref 3.5–5.2)
Alkaline Phosphatase: 83 U/L (ref 39–117)
BUN: 13 mg/dL (ref 6–23)
CO2: 27 mEq/L (ref 19–32)
Calcium: 9.9 mg/dL (ref 8.4–10.5)
Chloride: 100 mEq/L (ref 96–112)
Creatinine, Ser: 0.73 mg/dL (ref 0.40–1.20)
GFR: 77.98 mL/min (ref >60.00)
Glucose, Bld: 128 mg/dL — ABNORMAL HIGH (ref 70–99)
Potassium: 4 mEq/L (ref 3.5–5.1)
Sodium: 139 mEq/L (ref 135–145)
Total Bilirubin: 0.6 mg/dL (ref 0.2–1.2)
Total Protein: 7.8 g/dL (ref 6.0–8.3)

## 2020-02-04 LAB — MICROALBUMIN / CREATININE URINE RATIO
Creatinine,U: 80.7 mg/dL
Microalb Creat Ratio: 5.6 mg/g (ref 0.0–30.0)
Microalb, Ur: 4.5 mg/dL — ABNORMAL HIGH (ref 0.0–1.9)

## 2020-02-04 LAB — HEMOGLOBIN A1C: Hgb A1c MFr Bld: 7.1 % — ABNORMAL HIGH (ref 4.6–6.5)

## 2020-02-04 LAB — TSH: TSH: 1.75 u[IU]/mL (ref 0.35–4.50)

## 2020-02-05 ENCOUNTER — Encounter: Payer: Self-pay | Admitting: Family Medicine

## 2020-02-17 ENCOUNTER — Ambulatory Visit (HOSPITAL_BASED_OUTPATIENT_CLINIC_OR_DEPARTMENT_OTHER)
Admission: RE | Admit: 2020-02-17 | Payer: Medicare Other | Source: Home / Self Care | Admitting: Obstetrics & Gynecology

## 2020-02-17 HISTORY — DX: Gastro-esophageal reflux disease without esophagitis: K21.9

## 2020-02-17 HISTORY — DX: Dyspnea, unspecified: R06.00

## 2020-02-17 HISTORY — DX: History of falling: Z91.81

## 2020-02-17 HISTORY — DX: Dental restoration status: Z98.811

## 2020-02-17 HISTORY — DX: Unspecified osteoarthritis, unspecified site: M19.90

## 2020-02-17 HISTORY — DX: Postmenopausal bleeding: N95.0

## 2020-02-17 HISTORY — DX: Presence of spectacles and contact lenses: Z97.3

## 2020-02-17 HISTORY — DX: Type 2 diabetes mellitus without complications: E11.9

## 2020-02-17 HISTORY — DX: Abnormal findings on diagnostic imaging of other specified body structures: R93.89

## 2020-02-17 SURGERY — DILATATION AND CURETTAGE /HYSTEROSCOPY
Anesthesia: Choice

## 2020-02-26 ENCOUNTER — Telehealth: Payer: Self-pay

## 2020-02-26 NOTE — Telephone Encounter (Signed)
Attempted to reach patient in regards to rescheduling her surgery. Left message for patient to return call to office. Kathrene Alu RN

## 2020-03-12 ENCOUNTER — Telehealth: Payer: Self-pay | Admitting: Family Medicine

## 2020-03-12 NOTE — Telephone Encounter (Signed)
Results mailed 

## 2020-03-12 NOTE — Telephone Encounter (Signed)
Patient is requesting lab results be mail to her home address from 02/05/2020

## 2020-06-23 ENCOUNTER — Other Ambulatory Visit: Payer: Self-pay

## 2020-06-23 ENCOUNTER — Encounter (HOSPITAL_COMMUNITY): Payer: Self-pay | Admitting: Emergency Medicine

## 2020-06-23 ENCOUNTER — Emergency Department (HOSPITAL_COMMUNITY)
Admission: EM | Admit: 2020-06-23 | Discharge: 2020-06-23 | Disposition: A | Discharge status: Home / Self Care | Payer: Medicare Other | Attending: Emergency Medicine | Admitting: Emergency Medicine

## 2020-06-23 ENCOUNTER — Ambulatory Visit: Payer: Medicare Other | Admitting: Family Medicine

## 2020-06-23 ENCOUNTER — Emergency Department (HOSPITAL_COMMUNITY): Payer: Medicare Other

## 2020-06-23 ENCOUNTER — Ambulatory Visit (INDEPENDENT_AMBULATORY_CARE_PROVIDER_SITE_OTHER)
Admission: EM | Admit: 2020-06-23 | Discharge: 2020-06-23 | Disposition: A | Discharge status: Other Acute Inpatient Hospital | Payer: Medicare Other | Source: Home / Self Care

## 2020-06-23 DIAGNOSIS — E039 Hypothyroidism, unspecified: Secondary | ICD-10-CM | POA: Insufficient documentation

## 2020-06-23 DIAGNOSIS — I10 Essential (primary) hypertension: Secondary | ICD-10-CM | POA: Insufficient documentation

## 2020-06-23 DIAGNOSIS — R102 Pelvic and perineal pain unspecified side: Secondary | ICD-10-CM

## 2020-06-23 DIAGNOSIS — R935 Abnormal findings on diagnostic imaging of other abdominal regions, including retroperitoneum: Secondary | ICD-10-CM

## 2020-06-23 DIAGNOSIS — E119 Type 2 diabetes mellitus without complications: Secondary | ICD-10-CM | POA: Insufficient documentation

## 2020-06-23 DIAGNOSIS — Z79899 Other long term (current) drug therapy: Secondary | ICD-10-CM | POA: Insufficient documentation

## 2020-06-23 DIAGNOSIS — R350 Frequency of micturition: Secondary | ICD-10-CM | POA: Insufficient documentation

## 2020-06-23 DIAGNOSIS — Z7982 Long term (current) use of aspirin: Secondary | ICD-10-CM | POA: Diagnosis not present

## 2020-06-23 DIAGNOSIS — Z96643 Presence of artificial hip joint, bilateral: Secondary | ICD-10-CM | POA: Diagnosis not present

## 2020-06-23 DIAGNOSIS — N8189 Other female genital prolapse: Secondary | ICD-10-CM | POA: Diagnosis not present

## 2020-06-23 DIAGNOSIS — R3915 Urgency of urination: Secondary | ICD-10-CM | POA: Diagnosis not present

## 2020-06-23 DIAGNOSIS — K5792 Diverticulitis of intestine, part unspecified, without perforation or abscess without bleeding: Secondary | ICD-10-CM | POA: Diagnosis not present

## 2020-06-23 DIAGNOSIS — Z85850 Personal history of malignant neoplasm of thyroid: Secondary | ICD-10-CM | POA: Diagnosis not present

## 2020-06-23 DIAGNOSIS — Z7984 Long term (current) use of oral hypoglycemic drugs: Secondary | ICD-10-CM | POA: Diagnosis not present

## 2020-06-23 DIAGNOSIS — K219 Gastro-esophageal reflux disease without esophagitis: Secondary | ICD-10-CM | POA: Diagnosis not present

## 2020-06-23 DIAGNOSIS — R1032 Left lower quadrant pain: Secondary | ICD-10-CM | POA: Diagnosis present

## 2020-06-23 DIAGNOSIS — K5732 Diverticulitis of large intestine without perforation or abscess without bleeding: Secondary | ICD-10-CM | POA: Diagnosis not present

## 2020-06-23 LAB — URINALYSIS, ROUTINE W REFLEX MICROSCOPIC
Bilirubin Urine: NEGATIVE
Glucose, UA: NEGATIVE mg/dL
Hgb urine dipstick: NEGATIVE
Ketones, ur: NEGATIVE mg/dL
Leukocytes,Ua: NEGATIVE
Nitrite: NEGATIVE
Protein, ur: NEGATIVE mg/dL
Specific Gravity, Urine: 1.02 (ref 1.005–1.030)
pH: 5 (ref 5.0–8.0)

## 2020-06-23 LAB — CBC WITH DIFFERENTIAL/PLATELET
Abs Immature Granulocytes: 0.04 10*3/uL (ref 0.00–0.07)
Basophils Absolute: 0 10*3/uL (ref 0.0–0.1)
Basophils Relative: 0 %
Eosinophils Absolute: 0 10*3/uL (ref 0.0–0.5)
Eosinophils Relative: 0 %
HCT: 38.6 % (ref 36.0–46.0)
Hemoglobin: 13.1 g/dL (ref 12.0–15.0)
Immature Granulocytes: 0 %
Lymphocytes Relative: 20 %
Lymphs Abs: 2 10*3/uL (ref 0.7–4.0)
MCH: 31.7 pg (ref 26.0–34.0)
MCHC: 33.9 g/dL (ref 30.0–36.0)
MCV: 93.5 fL (ref 80.0–100.0)
Monocytes Absolute: 0.7 10*3/uL (ref 0.1–1.0)
Monocytes Relative: 7 %
Neutro Abs: 7.1 10*3/uL (ref 1.7–7.7)
Neutrophils Relative %: 73 %
Platelets: 234 10*3/uL (ref 150–400)
RBC: 4.13 MIL/uL (ref 3.87–5.11)
RDW: 12.7 % (ref 11.5–15.5)
WBC: 9.8 10*3/uL (ref 4.0–10.5)
nRBC: 0 % (ref 0.0–0.2)

## 2020-06-23 LAB — LIPASE, BLOOD: Lipase: 24 U/L (ref 11–51)

## 2020-06-23 LAB — COMPREHENSIVE METABOLIC PANEL
ALT: 24 U/L (ref 0–44)
AST: 27 U/L (ref 15–41)
Albumin: 3.7 g/dL (ref 3.5–5.0)
Alkaline Phosphatase: 94 U/L (ref 38–126)
Anion gap: 10 (ref 5–15)
BUN: 12 mg/dL (ref 8–23)
CO2: 26 mmol/L (ref 22–32)
Calcium: 9.3 mg/dL (ref 8.9–10.3)
Chloride: 100 mmol/L (ref 98–111)
Creatinine, Ser: 0.9 mg/dL (ref 0.44–1.00)
GFR, Estimated: >60 mL/min (ref >60)
Glucose, Bld: 238 mg/dL — ABNORMAL HIGH (ref 70–99)
Potassium: 3.5 mmol/L (ref 3.5–5.1)
Sodium: 136 mmol/L (ref 135–145)
Total Bilirubin: 0.8 mg/dL (ref 0.3–1.2)
Total Protein: 7.4 g/dL (ref 6.5–8.1)

## 2020-06-23 LAB — POCT URINALYSIS DIP (MANUAL ENTRY)
Bilirubin, UA: NEGATIVE
Blood, UA: NEGATIVE
Glucose, UA: NEGATIVE mg/dL
Ketones, POC UA: NEGATIVE mg/dL
Leukocytes, UA: NEGATIVE
Nitrite, UA: NEGATIVE
Protein Ur, POC: NEGATIVE mg/dL
Spec Grav, UA: 1.02 (ref 1.010–1.025)
Urobilinogen, UA: 1 E.U./dL
pH, UA: 5.5 (ref 5.0–8.0)

## 2020-06-23 MED ORDER — AMOXICILLIN-POT CLAVULANATE 875-125 MG PO TABS
1.0000 | ORAL_TABLET | Freq: Once | ORAL | Status: AC
  Administered 2020-06-23: 1 via ORAL
  Filled 2020-06-23: qty 1

## 2020-06-23 MED ORDER — OXYCODONE-ACETAMINOPHEN 5-325 MG PO TABS: 0.5000 | ORAL_TABLET | Freq: Three times a day (TID) | ORAL | 0 refills | Status: DC | PRN

## 2020-06-23 MED ORDER — AMOXICILLIN-POT CLAVULANATE 875-125 MG PO TABS: 1.0000 | ORAL_TABLET | Freq: Two times a day (BID) | ORAL | 0 refills | Status: AC

## 2020-06-23 NOTE — ED Provider Notes (Signed)
Emergency Medicine Provider Triage Evaluation Note  Anne [name] 80 y.o. female was evaluated in triage.  Pt complains of left lower quadrant/suprapubic abdominal pain that has been ongoing for the last 2 days.  She is also had some urinary frequency.  No dysuria, hematuria.  She states that the pain has become more severe.  She was seen in urgent care initially and was told to come to the emergency department for further evaluation.  Denies any fevers, chest pain, difficulty breathing, nausea/vomiting, diarrhea.   Review of Systems  Positive: Abdominal pain, urinary frequency. Negative: Fevers, nausea/vomiting/diarrhea.  Physical Exam  BP 134/82   Pulse 70   Temp 98.2 F (36.8 C) (Oral)   Resp 18   Ht 5\' 4"  (1.626 m)   Wt 65.8 kg   SpO2 100%   BMI 24.89 kg/m  Gen:   Awake, no distress   HEENT:  Atraumatic  Resp:  Normal effort  Cardiac:  Normal rate  Abd:   Nondistended, tenderness palpation left lower quadrant, suprapubic region. MSK:   Moves extremities without difficulty  Neuro:  Speech clear   Other:      Medical Decision Making  Medically screening exam initiated at 5:47 PM  Appropriate orders placed.  Anne [name] was informed that the remainder of the evaluation will be completed by another provider, this initial triage assessment does not replace that evaluation, and the importance of remaining in the ED until their evaluation is complete.   Clinical Impression  abd pain    Portions of this note were generated with Dragon dictation software. Dictation errors may occur despite best attempts at proofreading.     Volanda Napoleon, PA-C 06/23/20 1748    Noemi Chapel, MD 06/23/20 [phone]

## 2020-06-23 NOTE — ED Triage Notes (Signed)
Pt c/o pain to LLQ with urinary frequency and urgency x2 nights.

## 2020-06-23 NOTE — Discharge Instructions (Addendum)
Go immediately to [facility] emergency department for further work-up and evaluation of your left-sided pelvic pain. Given your history of an abnormal endometrium ultrasound the severity of your pain warrants further work-up with diagnostic imaging which is unavailable in the setting of urgent care.  I would recommend going today to avoid delaying evaluation to determine cause of pain.

## 2020-06-23 NOTE — ED Triage Notes (Signed)
Pt c/o LLQ pain with urinary frequency x 2 days. Denies n/v/d.

## 2020-06-23 NOTE — ED Provider Notes (Signed)
[name]    CSN: [id number] Arrival date & time: 06/23/20  1208      History   Chief Complaint Chief Complaint  Patient presents with  . Urinary Tract Infection    HPI Anne [name] is a 80 y.o. female.   HPI  Patient with a history of an abnormal vaginal ultrasound dating back to the latter part of 2021 previously scheduled to have a complete hysterectomy in January presents today with left lower pelvic pain.  Patient reports the pain was so sharp last evening that she was unable to sleep.  The pain is still present today with mild pressing on her lower pelvic region.  Complains of left lower pelvic pain x 1 day.  She has not had any fever, nausea or vomiting.  She does endorse that she normally has bowel movements every day and did not have a bowel movement yesterday and has not eaten very much today.  She has not been followed by the OB/GYN since the latter part of 2021.  She was scheduled to have a complete hysterectomy however had oral surgery during the time and procedure was canceled and patient never rescheduled.  She denies any unintentional weight loss.  She has not followed up with the OB/GYN since this time.  Past Medical History:  Diagnosis Date  . Dental crowns status    01-14-2020 per pt had upper left front tooth abscess and has temporary crown from 2 wks ago,  pt stated abscess resolved and crown is not loose  . Dyspnea    per pt gets sob at times with exertion  . GERD (gastroesophageal reflux disease)   . History of recent fall    per pt fell 01-08-2020 has bruised hip/ buttock / back area's and hit head ,  pt stated see provider, denies any dizziness, headache, or balance issues other than before  . Hypertension    followed by pcp  (nulcear study in epic 03-06-2013 normwl perfusion w/ no ischemia, nuclear ef 74%)  . 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 OA  (osteoarthritis)   . PMB (postmenopausal bleeding)   . Thickened endometrium   . Type 2 diabetes mellitus ([place])    followed by pcp  . Wears glasses     Patient Active Problem List   Diagnosis Date Noted  . Controlled type 2 diabetes mellitus without complication, without long-term current use of insulin ([place]) 03/31/2015  . Chest pressure 03/03/2013  . Globus sensation 03/03/2013  . DOE (dyspnea on exertion) 03/03/2013  . Thyroid cancer ([place]) 01/05/2012  . Hyperlipidemia 01/05/2012  . Dysphonia 01/05/2012  . 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 Hypertension 01/04/2012  . Hypothyroid 01/04/2012    Past Surgical History:  Procedure Laterality Date  . CATARACT EXTRACTION W/ INTRAOCULAR LENS  IMPLANT, BILATERAL  2008  . THYROIDECTOMY  12/25/2009  . TONSILLECTOMY AND ADENOIDECTOMY  age 47  . TOTAL HIP ARTHROPLASTY Bilateral left 10/07/2007;  right 06-03-2008  @[facility]     OB History    Gravida  3   Para  1   Term  1   Preterm      AB  2   Living  1     SAB  2   IAB      Ectopic      Multiple      Live Births  1  Home Medications    Prior to Admission medications   Medication Sig Start Date End Date Taking? Authorizing Provider  acetaminophen (TYLENOL) 500 MG tablet Take 500 mg by mouth every 6 (six) hours as needed for mild pain.    [provider]  aspirin 81 MG tablet Take 81 mg by mouth at bedtime.     [provider]  atorvastatin (LIPITOR) 20 MG tablet TAKE 1 TABLET(20 MG) BY MOUTH DAILY Patient taking differently: Take 20 mg by mouth at bedtime. TAKE 1 TABLET(20 MG) BY MOUTH DAILY 07/30/19   [name], Gay Filler, MD  b complex vitamins tablet Take 1 tablet by mouth daily.    [provider]  Calcium-Phosphorus-Vitamin D (CITRACAL +D3 PO) Take 2 tablets by mouth daily.     [provider]  Cholecalciferol (VITAMIN D-3) 5000 UNITS TABS Take 1 capsule by mouth daily.     [provider]   famotidine (PEPCID) 20 MG tablet TAKE 1/2 TABLET(10 MG) BY MOUTH DAILY Patient taking differently: Take 10 mg by mouth daily as needed. 07/01/18   [name], Gay Filler, MD  Glycerin-Hypromellose-PEG 400 ([facility] DRY EYE RELIEF OP) Apply 2 drops to eye as needed.     [provider]  hydrochlorothiazide (MICROZIDE) 12.5 MG capsule Take 12.5 mg by mouth as needed.    [provider]  levothyroxine (SYNTHROID) 112 MCG tablet Take 1 tablet (112 mcg total) by mouth daily. Patient taking differently: Take 112 mcg by mouth daily. 07/30/19   [name], Gay Filler, MD  losartan-hydrochlorothiazide (HYZAAR) 50-12.5 MG tablet Take 1 tablet by mouth daily. Patient taking differently: Take 1 tablet by mouth daily. 07/30/19   [name], Gay Filler, MD  metFORMIN (GLUCOPHAGE) 500 MG tablet TAKE 1 TABLET BY MOUTH DAILY Patient taking differently: Take 500 mg by mouth at bedtime. TAKE 1 TABLET BY MOUTH DAILY 07/30/19   [name], Gay Filler, MD  trolamine salicylate (ASPERCREME) 10 % cream Apply 1 application topically as needed for muscle pain.    [provider]    Family History Family History  Problem Relation Age of Onset  . Stroke Mother   . Cancer Father   . Cancer Sister   . Cancer Sister   . Cancer Sister   . Miscarriages / Stillbirths Neg Hx   . Breast cancer Neg Hx     Social History Social History   Tobacco Use  . Smoking status: Never Smoker  . Smokeless tobacco: Never Used  Vaping Use  . Vaping Use: Never used  Substance Use Topics  . Alcohol use: No  . Drug use: Never     Allergies   Patient has no known allergies.   Review of Systems Review of Systems Pertinent negatives listed in HPI   Physical Exam Triage Vital Signs ED Triage Vitals  Enc Vitals Group     BP 06/23/20 1405 (!) 149/78     Pulse Rate 06/23/20 1405 80     Resp 06/23/20 1405 20     Temp 06/23/20 1405 98.5 F (36.9 C)     Temp src --      SpO2 06/23/20 1405 97 %     Weight --       Height --      Head Circumference --      Peak Flow --      Pain Score 06/23/20 1406 8     Pain Loc --      Pain Edu? --      Excl.  in [place]? --    No data found.  Updated Vital Signs BP (!) 149/78 (BP Location: Left Arm)   Pulse 80   Temp 98.5 F (36.9 C)   Resp 20   SpO2 97%   Visual Acuity Right Eye Distance:   Left Eye Distance:   Bilateral Distance:    Right Eye Near:   Left Eye Near:    Bilateral Near:     Physical Exam General appearance: alert, well developed, well nourished, cooperative  Head: Normocephalic, without obvious abnormality, atraumatic Respiratory: Respirations even and unlabored, normal respiratory rate Heart: rate and rhythm normal. No gallop or murmurs noted on exam  Abdomen: BS +, rebound tenderness present left lower quadrant and left groin and pelvic region, no -palpable mass  Extremities: No gross deformities Skin: Skin color, texture, turgor normal. No rashes seen  Psych: Appropriate mood and affect. Neurologic: GCS 15, normal coordination normal gait  UC Treatments / Results  Labs (all labs ordered are listed, but only abnormal results are displayed) Labs Reviewed  URINE CULTURE  POCT URINALYSIS DIP (MANUAL ENTRY)    EKG   Radiology No results found.  Procedures Procedures (including critical care time)  Medications Ordered in UC Medications - No data to display  Initial Impression / Assessment and Plan / UC Course  I have reviewed the triage vital signs and the nursing notes.  Pertinent labs & imaging results that were available during my care of the patient were reviewed by me and considered in my medical decision making (see chart for details).      Urine culture canceled as patient's UA was insignificant for that of a urinary tract infection.  Given patient's history of abnormal uterine vaginal ultrasound patient warrants further evaluation in the setting of the ER given the significance of her left lower pelvic pain.   Patient has been advised to follow-up for further evaluation at [facility] emergency department.  She is accompanied by her husband. Final Clinical Impressions(s) / UC Diagnoses   Final diagnoses:  Pelvic pain, left sided  Abnormal endometrial ultrasound     Discharge Instructions     Go immediately to [facility] emergency department for further work-up and evaluation of your left-sided pelvic pain. Given your history of an abnormal endometrium ultrasound the severity of your pain warrants further work-up with diagnostic imaging which is unavailable in the setting of urgent care.  I would recommend going today to avoid delaying evaluation to determine cause of pain.    ED Prescriptions    None     PDMP not reviewed this encounter.   Scot Jun, FNP 06/29/20 0021

## 2020-06-23 NOTE — ED Provider Notes (Signed)
[facility] EMERGENCY DEPARTMENT Provider Note   CSN: [id number] Arrival date & time: 06/23/20  1738     History Chief Complaint  Patient presents with  . Abdominal Pain    Anne [name] is a 80 y.o. female presenting for evaluation of abdominal pain.  Patient states for the past 2 days, she has had suprapubic/left lower quadrant abdominal pain.  Symptoms worsened significantly last night.  She has associated urinary frequency.  She denies fevers, chills, nausea, vomiting, dysuria, hematuria, or abnormal bowel movements.  She states that when she has a bowel movement, pain improved slightly.  No blood in her stool.  She denies history of similar.  No previous history of kidney stone.  She took Tylenol for pain last night, has not tried anything else.  She has never had diverticulitis before.  Additional history obtained per chart review.  Patient with a history of diabetes, hypertension, hypothyroidism.  She has had an abnormal pelvic ultrasound recently, has not yet followed up with OB/GYN  HPI     Past Medical History:  Diagnosis Date  . Dental crowns status    01-14-2020 per pt had upper left front tooth abscess and has temporary crown from 2 wks ago,  pt stated abscess resolved and crown is not loose  . Dyspnea    per pt gets sob at times with exertion  . GERD (gastroesophageal reflux disease)   . History of recent fall    per pt fell 01-08-2020 has bruised hip/ buttock / back area's and hit head ,  pt stated see provider, denies any dizziness, headache, or balance issues other than before  . Hypertension    followed by pcp  (nulcear study in epic 03-06-2013 normwl perfusion w/ no ischemia, nuclear ef 74%)  . 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 OA (osteoarthritis)   . PMB (postmenopausal bleeding)   . Thickened endometrium   . Type 2 diabetes mellitus ([place])    followed by  pcp  . Wears glasses     Patient Active Problem List   Diagnosis Date Noted  . Controlled type 2 diabetes mellitus without complication, without long-term current use of insulin ([place]) 03/31/2015  . Chest pressure 03/03/2013  . Globus sensation 03/03/2013  . DOE (dyspnea on exertion) 03/03/2013  . Thyroid cancer ([place]) 01/05/2012  . Hyperlipidemia 01/05/2012  . Dysphonia 01/05/2012  . 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 Hypertension 01/04/2012  . Hypothyroid 01/04/2012    Past Surgical History:  Procedure Laterality Date  . CATARACT EXTRACTION W/ INTRAOCULAR LENS  IMPLANT, BILATERAL  2008  . THYROIDECTOMY  12/25/2009  . TONSILLECTOMY AND ADENOIDECTOMY  age 71  . TOTAL HIP ARTHROPLASTY Bilateral left 10/07/2007;  right 06-03-2008  @[facility]      OB History    Gravida  3   Para  1   Term  1   Preterm      AB  2   Living  1     SAB  2   IAB      Ectopic      Multiple      Live Births  1           Family History  Problem Relation Age of Onset  . Stroke Mother   . Cancer Father   . Cancer Sister   . Cancer Sister   . Cancer Sister   . Miscarriages / [place]  Neg Hx   . Breast cancer Neg Hx     Social History   Tobacco Use  . Smoking status: Never Smoker  . Smokeless tobacco: Never Used  Vaping Use  . Vaping Use: Never used  Substance Use Topics  . Alcohol use: No  . Drug use: Never    Home Medications Prior to Admission medications   Medication Sig Start Date End Date Taking? Authorizing Provider  amoxicillin-clavulanate (AUGMENTIN) 875-125 MG tablet Take 1 tablet by mouth every 12 (twelve) hours for 10 days. 06/23/20 07/03/20 Yes Gifford Ballon, PA-C  oxyCODONE-acetaminophen (PERCOCET/ROXICET) 5-325 MG tablet Take 0.5 tablets by mouth every 8 (eight) hours as needed for severe pain. 06/23/20  Yes Daishia Fetterly, PA-C  acetaminophen (TYLENOL) 500 MG tablet Take 500 mg by mouth every 6 (six) hours as needed for mild pain.    [provider]  aspirin 81 MG tablet Take 81 mg by mouth at bedtime.     [provider]  atorvastatin (LIPITOR) 20 MG tablet TAKE 1 TABLET(20 MG) BY MOUTH DAILY Patient taking differently: Take 20 mg by mouth at bedtime. TAKE 1 TABLET(20 MG) BY MOUTH DAILY 07/30/19   [name], Gay Filler, MD  b complex vitamins tablet Take 1 tablet by mouth daily.    [provider]  Calcium-Phosphorus-Vitamin D (CITRACAL +D3 PO) Take 2 tablets by mouth daily.     [provider]  Cholecalciferol (VITAMIN D-3) 5000 UNITS TABS Take 1 capsule by mouth daily.     [provider]  famotidine (PEPCID) 20 MG tablet TAKE 1/2 TABLET(10 MG) BY MOUTH DAILY Patient taking differently: Take 10 mg by mouth daily as needed. 07/01/18   [name], Gay Filler, MD  Glycerin-Hypromellose-PEG 400 ([facility] DRY EYE RELIEF OP) Apply 2 drops to eye as needed.     [provider]  hydrochlorothiazide (MICROZIDE) 12.5 MG capsule Take 12.5 mg by mouth as needed.    [provider]  levothyroxine (SYNTHROID) 112 MCG tablet Take 1 tablet (112 mcg total) by mouth daily. Patient taking differently: Take 112 mcg by mouth daily. 07/30/19   [name], Gay Filler, MD  losartan-hydrochlorothiazide (HYZAAR) 50-12.5 MG tablet Take 1 tablet by mouth daily. Patient taking differently: Take 1 tablet by mouth daily. 07/30/19   [name], Gay Filler, MD  metFORMIN (GLUCOPHAGE) 500 MG tablet TAKE 1 TABLET BY MOUTH DAILY Patient taking differently: Take 500 mg by mouth at bedtime. TAKE 1 TABLET BY MOUTH DAILY 07/30/19   [name], Gay Filler, MD  trolamine salicylate (ASPERCREME) 10 % cream Apply 1 application topically as needed for muscle pain.    [provider]    Allergies    Patient has no known allergies.  Review of Systems   Review of Systems  Gastrointestinal: Positive for abdominal pain.  Genitourinary: Positive for frequency.  All other systems reviewed and are negative.   Physical Exam Updated  Vital Signs BP (!) 131/50   Pulse 70   Temp 98.2 F (36.8 C) (Oral)   Resp 16   SpO2 100%   Physical Exam Vitals and nursing note reviewed.  Constitutional:      General: She is not in acute distress.    Appearance: She is well-developed.     Comments: Resting comfortably in bed in no acute distress  HENT:     Head: Normocephalic and atraumatic.  Eyes:     Conjunctiva/sclera: Conjunctivae normal.     Pupils: Pupils are equal, round, and reactive to light.  Cardiovascular:  Rate and Rhythm: Normal rate and regular rhythm.     Pulses: Normal pulses.  Pulmonary:     Effort: Pulmonary effort is normal. No respiratory distress.     Breath sounds: Normal breath sounds. No wheezing.  Abdominal:     General: There is no distension.     Palpations: Abdomen is soft. There is no mass.     Tenderness: There is abdominal tenderness in the suprapubic area and left lower quadrant. There is no guarding or rebound.     Comments: Tenderness palpation of suprapubic and left lower quadrant abdomen.  No rigidity, guarding, distention.  Negative rebound.  No peritonitis.  No CVA tenderness.  Musculoskeletal:        General: Normal range of motion.     Cervical back: Normal range of motion and neck supple.  Skin:    General: Skin is warm and dry.     Capillary Refill: Capillary refill takes less than 2 seconds.  Neurological:     Mental Status: She is alert and oriented to person, place, and time.     ED Results / Procedures / Treatments   Labs (all labs ordered are listed, but only abnormal results are displayed) Labs Reviewed  COMPREHENSIVE METABOLIC PANEL - Abnormal; Notable for the following components:      Result Value   Glucose, Bld 238 (*)    All other components within normal limits  URINALYSIS, ROUTINE W REFLEX MICROSCOPIC - Abnormal; Notable for the following components:   APPearance HAZY (*)    All other components within normal limits  CBC WITH DIFFERENTIAL/PLATELET   LIPASE, BLOOD    EKG None  Radiology CT ABDOMEN PELVIS WO CONTRAST  Result Date: 06/23/2020 CLINICAL DATA:  Left lower quadrant abdominal pain with urinary frequency and urgency for 2 nights. EXAM: CT ABDOMEN AND PELVIS WITHOUT CONTRAST TECHNIQUE: Multidetector CT imaging of the abdomen and pelvis was performed following the standard protocol without IV contrast. COMPARISON:  None. FINDINGS: Lower chest: Clear lung bases. Normal heart size without pericardial or pleural effusion. Hepatobiliary: Mild motion degradation throughout. Normal noncontrast appearance of the liver, gallbladder, biliary tract. Pancreas: Normal, without mass or ductal dilatation. Spleen: Normal in size, without focal abnormality. Adrenals/Urinary Tract: Normal adrenal glands. No renal calculi or hydronephrosis. Degraded evaluation of the pelvis, Degraded evaluation of the pelvis, secondary to beam hardening artifact from bilateral hip arthroplasty. No gross pelvic ureteric or bladder stones. Stomach/Bowel: Normal stomach, without wall thickening. Edema surrounding a sigmoid diverticula including on 64/3. No free perforation or surrounding drainable abscess. Colonic stool burden suggests constipation. Normal terminal ileum and appendix. Normal small bowel. Vascular/Lymphatic: Aortic atherosclerosis. No abdominopelvic adenopathy. Reproductive: Retroverted uterus.  No adnexal mass. Other: No significant free fluid.  Mild pelvic floor laxity. Musculoskeletal: Bilateral hip arthroplasty. IMPRESSION: 1. Non complicated sigmoid diverticulitis. 2. Degraded evaluation of the pelvis, secondary to beam hardening artifact from bilateral hip arthroplasty. 3. Possible constipation. 4. Aortic Atherosclerosis (ICD10-I70.0). Electronically Signed   By: Abigail Miyamoto M.D.   On: 06/23/2020 18:37    Procedures Procedures   Medications Ordered in ED Medications  amoxicillin-clavulanate (AUGMENTIN) 875-125 MG per tablet 1 tablet (1 tablet Oral  Given 06/23/20 2220)    ED Course  I have reviewed the triage vital signs and the nursing notes.  Pertinent labs & imaging results that were available during my care of the patient were reviewed by me and considered in my medical decision making (see chart for details).    MDM Rules/Calculators/A&P  Patient presenting for evaluation of abdominal pain.  On exam, patient peers nontoxic.  She does have tenderness palpation of the left lower quadrant.  Labs obtained in triage interpreted by me, overall reassuring.  Vital signs are stable.  Exam is not consistent with sepsis.  Patient CT ordered from triage shows diverticulitis, this is likely the cause of her sxs.  Discussed findings with patient and husband.  Discussed symptomatic treatment.  As she has no white count or fever and is pain controlled at this time, I do not believe she needs admission for this.  I discussed importance of taking antibiotics and close follow-up with PCP.  I also discussed that she had an abnormal pelvic ultrasound in the past and will need to follow-up with OB/GYN. At this time, pt appears safe for d/c. Return precautions given. Pt states she understands and agrees to plan.  Final Clinical Impression(s) / ED Diagnoses Final diagnoses:  Diverticulitis    Rx / DC Orders ED Discharge Orders         Ordered    amoxicillin-clavulanate (AUGMENTIN) 875-125 MG tablet  Every 12 hours        06/23/20 2148    oxyCODONE-acetaminophen (PERCOCET/ROXICET) 5-325 MG tablet  Every 8 hours PRN        06/23/20 2148           Franchot Heidelberg, PA-C 06/23/20 2335    Tegeler, Gwenyth Allegra, MD 06/23/20 [id number]

## 2020-06-23 NOTE — Discharge Instructions (Addendum)
Your work-up today showed an infection that we called diverticulitis.  This is infection of the intestines. Take antibiotics to treat this infection. Use Tylenol as needed for pain control.  If you are unable to control with Tylenol, take a half a Percocet tablet.  Have caution, this may make you tired or groggy.  Do not drive while taking this medicine.  This medicine may also increase your risk of falls, so make sure they are somebody at home with you while you take this. Return to the emergency room if you develop high fevers, persistent vomiting, severe worsening abdominal pain.  Additionally, in review of your chart, it appears that you had an abnormal ultrasound in the past. It is recommended that you follow-up with an OB/GYN for further evaluation.  Call the office below to set up a follow-up appointment.

## 2020-06-26 LAB — URINE CULTURE: Culture: <10000 — AB

## 2020-06-29 ENCOUNTER — Telehealth: Payer: Self-pay | Admitting: General Practice

## 2020-06-29 NOTE — Telephone Encounter (Signed)
-----   Message from Lavonia Drafts, MD sent at 06/29/2020 11:38 AM EDT ----- Please get pt on my calendar.   Thanks,   Clh-S ----- Message ----- From: Guss Bunde, MD Sent: 06/28/2020  12:18 PM EDT To: Lavonia Drafts, MD, #  Hi, This patient came to ED for LLQ abdominal pain and diagnosed with diverticulitis.  It doesn't look like she ever had her d & C with you (note from November 2021).  Wanted to make sure you knew in case she got lost in the Covid surge cancellations.

## 2020-06-29 NOTE — Telephone Encounter (Signed)
Left message on home answering machine and cell phone voice mail for patient to contact our office to schedule follow up appointment with Dr. Ihor Dow.

## 2020-07-16 ENCOUNTER — Encounter (HOSPITAL_COMMUNITY): Payer: Self-pay | Admitting: Pharmacy Technician

## 2020-07-16 ENCOUNTER — Other Ambulatory Visit: Payer: Self-pay

## 2020-07-16 ENCOUNTER — Emergency Department (HOSPITAL_COMMUNITY)
Admission: EM | Admit: 2020-07-16 | Discharge: 2020-07-16 | Disposition: A | Discharge status: Home / Self Care | Payer: Medicare Other | Attending: Emergency Medicine | Admitting: Emergency Medicine

## 2020-07-16 DIAGNOSIS — N760 Acute vaginitis: Secondary | ICD-10-CM | POA: Insufficient documentation

## 2020-07-16 DIAGNOSIS — E119 Type 2 diabetes mellitus without complications: Secondary | ICD-10-CM | POA: Diagnosis not present

## 2020-07-16 DIAGNOSIS — I10 Essential (primary) hypertension: Secondary | ICD-10-CM | POA: Insufficient documentation

## 2020-07-16 DIAGNOSIS — Z79899 Other long term (current) drug therapy: Secondary | ICD-10-CM | POA: Insufficient documentation

## 2020-07-16 DIAGNOSIS — E039 Hypothyroidism, unspecified: Secondary | ICD-10-CM | POA: Insufficient documentation

## 2020-07-16 DIAGNOSIS — Z7982 Long term (current) use of aspirin: Secondary | ICD-10-CM | POA: Diagnosis not present

## 2020-07-16 DIAGNOSIS — L299 Pruritus, unspecified: Secondary | ICD-10-CM | POA: Diagnosis present

## 2020-07-16 DIAGNOSIS — Z96643 Presence of artificial hip joint, bilateral: Secondary | ICD-10-CM | POA: Diagnosis not present

## 2020-07-16 DIAGNOSIS — Z7984 Long term (current) use of oral hypoglycemic drugs: Secondary | ICD-10-CM | POA: Diagnosis not present

## 2020-07-16 LAB — URINALYSIS, ROUTINE W REFLEX MICROSCOPIC
Bilirubin Urine: NEGATIVE
Glucose, UA: NEGATIVE mg/dL
Ketones, ur: NEGATIVE mg/dL
Nitrite: NEGATIVE
Protein, ur: NEGATIVE mg/dL
Specific Gravity, Urine: 1.011 (ref 1.005–1.030)
pH: 6 (ref 5.0–8.0)

## 2020-07-16 LAB — WET PREP, GENITAL
Clue Cells Wet Prep HPF POC: NONE SEEN
Sperm: NONE SEEN
Trich, Wet Prep: NONE SEEN
Yeast Wet Prep HPF POC: NONE SEEN

## 2020-07-16 MED ORDER — FLUCONAZOLE 150 MG PO TABS
150.0000 mg | ORAL_TABLET | Freq: Once | ORAL | Status: AC
  Administered 2020-07-16: 150 mg via ORAL
  Filled 2020-07-16: qty 1

## 2020-07-16 NOTE — ED Triage Notes (Signed)
Pt recently dx with diverticulitis, took abx and completed them. Pt now with itching, rash, vaginal discharge (possibly bloody discharge).

## 2020-07-16 NOTE — Discharge Instructions (Signed)
You have been given a dose of Diflucan to treat a possible yeast infection although yeast did not show up on the wet prep.  Follow-up with Dr. Ihor Dow for further evaluation of both the vaginal complaints and the continued bleeding.  Follow-up with Dr. Edilia Bo for the itching.  Urine culture has been sent and you will be notified if it shows infection

## 2020-07-16 NOTE — ED Provider Notes (Signed)
[facility] EMERGENCY DEPARTMENT Provider Note   CSN: [id number] Arrival date & time: 07/16/20  1141     History No chief complaint on file.   Anne [name] is a 80 y.o. female.  HPI Patient presents with itchiness.  States that overall body.  Sometimes in her ears.  Worse at night.  No rash.  Recently had been on antibiotics for diverticulitis.  Finish that up around 2 weeks ago.  States abdomen is doing well but at times will feel a little bit of pain diffusely.  States her move around.  States it feels better after she has gas or has a bowel movement. Also some vaginal and labial itching.  States spent some white discharge but then became more bloody.  No fevers.  Slight dysuria at times.  Has had previous dysfunctional uterine bleeding but states she was told it was not cancer.    Past Medical History:  Diagnosis Date  . Dental crowns status    01-14-2020 per pt had upper left front tooth abscess and has temporary crown from 2 wks ago,  pt stated abscess resolved and crown is not loose  . Dyspnea    per pt gets sob at times with exertion  . GERD (gastroesophageal reflux disease)   . History of recent fall    per pt fell 01-08-2020 has bruised hip/ buttock / back area's and hit head ,  pt stated see provider, denies any dizziness, headache, or balance issues other than before  . Hypertension    followed by pcp  (nulcear study in epic 03-06-2013 normwl perfusion w/ no ischemia, nuclear ef 74%)  . 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 OA (osteoarthritis)   . PMB (postmenopausal bleeding)   . Thickened endometrium   . Type 2 diabetes mellitus ([place])    followed by pcp  . Wears glasses     Patient Active Problem List   Diagnosis Date Noted  . Controlled type 2 diabetes mellitus without complication, without long-term current use of insulin ([place]) 03/31/2015  . Chest pressure  03/03/2013  . Globus sensation 03/03/2013  . DOE (dyspnea on exertion) 03/03/2013  . Thyroid cancer ([place]) 01/05/2012  . Hyperlipidemia 01/05/2012  . Dysphonia 01/05/2012  . 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 Hypertension 01/04/2012  . Hypothyroid 01/04/2012    Past Surgical History:  Procedure Laterality Date  . CATARACT EXTRACTION W/ INTRAOCULAR LENS  IMPLANT, BILATERAL  2008  . THYROIDECTOMY  12/25/2009  . TONSILLECTOMY AND ADENOIDECTOMY  age 50  . TOTAL HIP ARTHROPLASTY Bilateral left 10/07/2007;  right 06-03-2008  @[facility]      OB History    Gravida  3   Para  1   Term  1   Preterm      AB  2   Living  1     SAB  2   IAB      Ectopic      Multiple      Live Births  1           Family History  Problem Relation Age of Onset  . Stroke Mother   . Cancer Father   . Cancer Sister   . Cancer Sister   . Cancer Sister   . Miscarriages / Stillbirths Neg Hx   . Breast cancer Neg Hx     Social History   Tobacco Use  . Smoking status: Never  Smoker  . Smokeless tobacco: Never Used  Vaping Use  . Vaping Use: Never used  Substance Use Topics  . Alcohol use: No  . Drug use: Never    Home Medications Prior to Admission medications   Medication Sig Start Date End Date Taking? Authorizing Provider  acetaminophen (TYLENOL) 500 MG tablet Take 500 mg by mouth every 6 (six) hours as needed for mild pain.    [provider]  aspirin 81 MG tablet Take 81 mg by mouth at bedtime.     [provider]  atorvastatin (LIPITOR) 20 MG tablet TAKE 1 TABLET(20 MG) BY MOUTH DAILY Patient taking differently: Take 20 mg by mouth at bedtime. TAKE 1 TABLET(20 MG) BY MOUTH DAILY 07/30/19   [name], Gay Filler, MD  b complex vitamins tablet Take 1 tablet by mouth daily.    [provider]  Calcium-Phosphorus-Vitamin D (CITRACAL +D3 PO) Take 2 tablets by mouth daily.     [provider]  Cholecalciferol (VITAMIN D-3) 5000 UNITS TABS Take 1  capsule by mouth daily.     [provider]  famotidine (PEPCID) 20 MG tablet TAKE 1/2 TABLET(10 MG) BY MOUTH DAILY Patient taking differently: Take 10 mg by mouth daily as needed. 07/01/18   [name], Gay Filler, MD  Glycerin-Hypromellose-PEG 400 ([facility] DRY EYE RELIEF OP) Apply 2 drops to eye as needed.     [provider]  hydrochlorothiazide (MICROZIDE) 12.5 MG capsule Take 12.5 mg by mouth as needed.    [provider]  levothyroxine (SYNTHROID) 112 MCG tablet Take 1 tablet (112 mcg total) by mouth daily. Patient taking differently: Take 112 mcg by mouth daily. 07/30/19   [name], Gay Filler, MD  losartan-hydrochlorothiazide (HYZAAR) 50-12.5 MG tablet Take 1 tablet by mouth daily. Patient taking differently: Take 1 tablet by mouth daily. 07/30/19   [name], Gay Filler, MD  metFORMIN (GLUCOPHAGE) 500 MG tablet TAKE 1 TABLET BY MOUTH DAILY Patient taking differently: Take 500 mg by mouth at bedtime. TAKE 1 TABLET BY MOUTH DAILY 07/30/19   [name], Gay Filler, MD  oxyCODONE-acetaminophen (PERCOCET/ROXICET) 5-325 MG tablet Take 0.5 tablets by mouth every 8 (eight) hours as needed for severe pain. 06/23/20   Caccavale, Sophia, PA-C  trolamine salicylate (ASPERCREME) 10 % cream Apply 1 application topically as needed for muscle pain.    [provider]    Allergies    Patient has no known allergies.  Review of Systems   Review of Systems  Constitutional: Negative for appetite change.  HENT: Negative for congestion.   Respiratory: Negative for shortness of breath.   Gastrointestinal: Positive for abdominal pain. Negative for constipation.  Genitourinary: Positive for vaginal bleeding and vaginal discharge.  Musculoskeletal: Negative for back pain.  Skin: Negative for rash.       Itchiness  Neurological: Negative for weakness.  Psychiatric/Behavioral: Negative for confusion.    Physical Exam Updated Vital Signs BP (!) 167/72   Pulse 70   Temp 97.7 F (36.5 C)  (Oral)   Resp (!) 21   SpO2 99%   Physical Exam Vitals and nursing note reviewed.  HENT:     Head: Normocephalic.     Mouth/Throat:     Mouth: Mucous membranes are moist.  Eyes:     General: No scleral icterus. Cardiovascular:     Rate and Rhythm: Normal rate.  Pulmonary:     Effort: Pulmonary effort is normal.  Abdominal:     Palpations: There is no mass.  Tenderness: There is no abdominal tenderness.     Hernia: No hernia is present.  Musculoskeletal:        General: No tenderness.  Skin:    General: Skin is warm.     Capillary Refill: Capillary refill takes less than 2 seconds.     Coloration: Skin is not jaundiced.     Findings: No erythema, lesion or rash.  Neurological:     Mental Status: She is alert and oriented to person, place, and time.   Pelvic exam showed some mild bloody discharge.  No cervical motion tenderness.  ED Results / Procedures / Treatments   Labs (all labs ordered are listed, but only abnormal results are displayed) Labs Reviewed  WET PREP, GENITAL - Abnormal; Notable for the following components:      Result Value   WBC, Wet Prep HPF POC MANY (*)    All other components within normal limits  URINALYSIS, ROUTINE W REFLEX MICROSCOPIC - Abnormal; Notable for the following components:   APPearance HAZY (*)    Hgb urine dipstick MODERATE (*)    Leukocytes,Ua SMALL (*)    Bacteria, UA FEW (*)    All other components within normal limits  URINE CULTURE    EKG None  Radiology No results found.  Procedures Procedures   Medications Ordered in ED Medications  fluconazole (DIFLUCAN) tablet 150 mg (150 mg Oral Given 07/16/20 1815)    ED Course  I have reviewed the triage vital signs and the nursing notes.  Pertinent labs & imaging results that were available during my care of the patient were reviewed by me and considered in my medical decision making (see chart for details).    MDM Rules/Calculators/A&P                          Patient with itchiness vaginally after being on antibiotics.  Will treat empirically with a dose of Diflucan to cover a yeast although no yeast showed up on wet prep.  Has some vaginal bleeding.  Has been seen by GYN and there had been plans for a D&C although had not been done.  I think follow-up with her would be prudent.  Also some diffuse itching.  Does not have a rash.  Reviewed recent labs.  No jaundice.  Patient appears stable for PCP follow-up and does not need more blood work at this time.  Urine also had some hematuria and potentially infection.  Culture sent but not empirically treated.  Final Clinical Impression(s) / ED Diagnoses Final diagnoses:  Acute vaginitis    Rx / DC Orders ED Discharge Orders    None       Davonna Belling, MD 07/17/20 0020

## 2020-07-16 NOTE — ED Notes (Signed)
The pt was here 2 weeks ago for a  uti    She is c/o itching all over her body  She has had some vaginal bleeding since yestereday  Unable to sleep  She feels like something is crawling all over

## 2020-07-16 NOTE — ED Provider Notes (Signed)
Emergency Medicine Provider Triage Evaluation Note  TARSHA BLANDO , a 80 y.o. female  was evaluated in triage.  Pt complains of itchy rash throughout her body, vaginal irritation and burning as well as some mild bleeding.  She was diagnosed with diverticulitis a few weeks ago and was started on antibiotics.  Symptoms began after she finished the antibiotics.  Denies any abdominal pain or chest pain.  Review of Systems  Positive: Rash, vaginal irritation Negative: Abdominal pain or chest pain  Physical Exam  BP (!) 164/66 (BP Location: Right Arm)   Pulse 81   Temp 98.6 F (37 C) (Oral)   Resp 16   SpO2 100%  Gen:   Awake, no distress Resp:  Normal effort MSK:   Moves extremities without difficulty Other:  No signs of angioedema anaphylaxis  Medical Decision Making  Medically screening exam initiated at 12:26 PM.  Appropriate orders placed.  LEVAEH VICE was informed that the remainder of the evaluation will be completed by another provider, this initial triage assessment does not replace that evaluation, and the importance of remaining in the ED until their evaluation is complete.  Will order urinalysis   Delia Heady, PA-C 07/16/20 1227    Pattricia Boss, MD 07/19/20 1341

## 2020-07-19 ENCOUNTER — Telehealth: Payer: Self-pay

## 2020-07-19 LAB — URINE CULTURE: Culture: >=100000 — AB

## 2020-07-19 NOTE — Telephone Encounter (Addendum)
I called pt and left VM asking her to call the office to schedule appt  ----- Message from Lavonia Drafts, MD sent at 07/18/2020  7:48 PM EDT ----- Please call pt. I received a recent note where she had itching and vaginal bleeding.  I recommend that she get a visit ASAP with me as she still needs a hysteroscopy. I can meet with her first by phone to set this up if that is easier for her. Please document conversation.   Thank you,  Clh-S

## 2020-07-20 ENCOUNTER — Telehealth: Payer: Self-pay | Admitting: Emergency Medicine

## 2020-07-20 NOTE — Telephone Encounter (Signed)
Post ED Visit - Positive Culture Follow-up  Culture report reviewed by antimicrobial stewardship pharmacist: [facility] Team []  Elenor Quinones, Pharm.D. []  Heide Guile, Pharm.D., BCPS AQ-ID []  Parks Neptune, Pharm.D., BCPS []  Alycia Rossetti, Pharm.D., BCPS []  Hunter, Pharm.D., BCPS, AAHIVP []  Legrand Como, Pharm.D., BCPS, AAHIVP []  Salome Arnt, PharmD, BCPS []  Johnnette Gourd, PharmD, BCPS []  Hughes Better, PharmD, BCPS []  Leeroy Cha, PharmD []  Laqueta Linden, PharmD, BCPS []  Albertina Parr, PharmD  [place] Team []  Leodis Sias, PharmD []  Lindell Spar, PharmD []  Royetta Asal, PharmD []  Graylin Shiver, Rph []  Rema Fendt) Glennon Mac, PharmD []  Arlyn Dunning, PharmD []  Netta Cedars, PharmD []  Dia Sitter, PharmD []  Leone Haven, PharmD []  Gretta Arab, PharmD []  Theodis Shove, PharmD []  Peggyann Juba, PharmD []  Reuel Boom, PharmD   Positive urine culture Treated with none, was treated with diflucan empirically for yeast infection, no further patient follow-up is required at this time.  Hazle Nordmann 07/20/2020, 11:40 AM

## 2020-07-21 ENCOUNTER — Telehealth: Payer: Self-pay | Admitting: General Practice

## 2020-07-21 NOTE — Telephone Encounter (Signed)
-----   Message from Lyndal Rainbow, [place] sent at 07/19/2020  8:10 AM EDT ----- Regarding: Appt I called this pt and left her a voicemail to call office and set up appt ----- Message ----- From: Lavonia Drafts, MD Sent: 07/18/2020   7:51 PM EDT To: [facility]  Please call pt. I received a recent note where she had itching and vaginal bleeding.  I recommend that she get a visit ASAP with me as she still needs a hysteroscopy. I can meet with her first by phone to set this up if that is easier for her. Please document conversation.   Thank you,  Cl[facility]

## 2020-07-21 NOTE — Telephone Encounter (Signed)
Left message on voice mail for patient to call office to schedule appt.

## 2020-07-27 ENCOUNTER — Other Ambulatory Visit: Payer: Self-pay | Admitting: Family Medicine

## 2020-07-27 DIAGNOSIS — E119 Type 2 diabetes mellitus without complications: Secondary | ICD-10-CM

## 2020-07-27 DIAGNOSIS — E782 Mixed hyperlipidemia: Secondary | ICD-10-CM

## 2020-07-27 DIAGNOSIS — E890 Postprocedural hypothyroidism: Secondary | ICD-10-CM

## 2020-07-31 NOTE — Progress Notes (Addendum)
[facility] [address], [place], [place] [address] [phone]  Date:  08/02/2020   Name:  Anne [name]   DOB:  January 29, 1941   MRN:  [id number]  PCP:  Darreld Mclean, MD    Chief Complaint: Medical Management of Chronic Issues (6 m f.u )   History of Present Illness:  Anne [name] is a 80 y.o. very pleasant female patient who presents with the following:  Pt seen today for a 6 month follow-up visit-accompanied by her husband Last seen by myself in December  History of controlled diabetes, hyperlipidemia, hypertension, hypothyroidism Thyroid cancer status post thyroidectomy in 2011 ?did she ever get her D and C for PMB?  This had been delayed earlier this year - pt has decided she does not want to pursue this further.  Pt reports she was told it was not cancer   Married to [place] She has a grandson- Hydrologist- who has cancer He visited just before [place] had a very nice visit but unfortunately Anne [name] started not feeling well and ended up needing to have an AV shunt placed  Covid booster- she had delayed due to being ill recently, plans to get this done Foot exam now due Update A1c - will get today  Shingrix and pneumonia vaccines UTD  She was seen in the ER with diverticulitis in May- she was treated with amox and then developed severe vaginitis likely secondary to antibiotic; this was also treated with Diflucan and she is now feeling back to normal Lab Results  Component Value Date   HGBA1C 7.1 (H) 02/04/2020   CMP, CBC done in May   Lab Results  Component Value Date   TSH 1.75 02/04/2020     Patient Active Problem List   Diagnosis Date Noted   Diverticulitis 08/02/2020   Controlled type 2 diabetes mellitus without complication, without long-term current use of insulin ([place]) 03/31/2015   Chest pressure 03/03/2013   Globus sensation 03/03/2013   DOE (dyspnea on exertion) 03/03/2013    Thyroid cancer ([place]) 01/05/2012   Hyperlipidemia 01/05/2012   Dysphonia 01/05/2012   Osteoarthritis, hip, 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   Hypertension 01/04/2012   Hypothyroid 01/04/2012    Past Medical History:  Diagnosis Date   Dental crowns status    01-14-2020 per pt had upper left front tooth abscess and has temporary crown from 2 wks ago,  pt stated abscess resolved and crown is not loose   Dyspnea    per pt gets sob at times with exertion   GERD (gastroesophageal reflux disease)    History of recent fall    per pt fell 01-08-2020 has bruised hip/ buttock / back area's and hit head ,  pt stated see provider, denies any dizziness, headache, or balance issues other than before   Hypertension    followed by pcp  (nulcear study in epic 03-06-2013 normwl perfusion w/ no ischemia, nuclear ef 74%)   Hypothyroidism, 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   followed by pcp---  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)   OA (osteoarthritis)    PMB (postmenopausal bleeding)    Thickened endometrium    Type 2 diabetes mellitus ([place])    followed by pcp   Wears glasses     Past Surgical History:  Procedure Laterality Date   CATARACT EXTRACTION W/ INTRAOCULAR LENS  IMPLANT, BILATERAL  2008   THYROIDECTOMY  12/25/2009   TONSILLECTOMY AND ADENOIDECTOMY  age 30  TOTAL HIP ARTHROPLASTY Bilateral left 10/07/2007;  right 06-03-2008  @[facility]     Social History   Tobacco Use   Smoking status: Never   Smokeless tobacco: Never  Vaping Use   Vaping Use: Never used  Substance Use Topics   Alcohol use: No   Drug use: Never    Family History  Problem Relation Age of Onset   Stroke Mother    Cancer Father    Cancer Sister    Cancer Sister    Cancer Sister    Miscarriages / Stillbirths Neg Hx    Breast cancer Neg Hx     Allergies  Allergen Reactions   Amoxicillin Itching    vaginal itching and bleeding (side effect)    Medication list has been reviewed and updated.  Current Outpatient  Medications on File Prior to Visit  Medication Sig Dispense Refill   acetaminophen (TYLENOL) 500 MG tablet Take 500 mg by mouth every 6 (six) hours as needed for mild pain.     aspirin 81 MG tablet Take 81 mg by mouth at bedtime.      atorvastatin (LIPITOR) 20 MG tablet TAKE 1 TABLET(20 MG) BY MOUTH DAILY 90 tablet 3   b complex vitamins tablet Take 1 tablet by mouth daily.     Calcium-Phosphorus-Vitamin D (CITRACAL +D3 PO) Take 2 tablets by mouth daily.      Cholecalciferol (VITAMIN D-3) 5000 UNITS TABS Take 1 capsule by mouth daily.      famotidine (PEPCID) 20 MG tablet TAKE 1/2 TABLET(10 MG) BY MOUTH DAILY (Patient taking differently: Take 10 mg by mouth daily as needed.) 45 tablet 3   Glycerin-Hypromellose-PEG 400 ([employer] DRY EYE RELIEF OP) Apply 2 drops to eye as needed.      hydrochlorothiazide (MICROZIDE) 12.5 MG capsule Take 12.5 mg by mouth as needed.     levothyroxine (SYNTHROID) 112 MCG tablet TAKE 1 TABLET(112 MCG) BY MOUTH DAILY 90 tablet 3   metFORMIN (GLUCOPHAGE) 500 MG tablet TAKE 1 TABLET BY MOUTH DAILY 90 tablet 3   trolamine salicylate (ASPERCREME) 10 % cream Apply 1 application topically as needed for muscle pain.     oxyCODONE-acetaminophen (PERCOCET/ROXICET) 5-325 MG tablet Take 0.5 tablets by mouth every 8 (eight) hours as needed for severe pain. (Patient not taking: Reported on 08/02/2020) 3 tablet 0   No current facility-administered medications on file prior to visit.    Review of Systems:  As per HPI- otherwise negative.   Physical Examination: Vitals:   08/02/20 1537  BP: (!) 180/90  Pulse: 82  Temp: 97.8 F (36.6 C)  SpO2: 97%   Vitals:   08/02/20 1537  Weight: 216 lb 3.2 oz (98.1 kg)  Height: 5\' 5"  (1.651 m)   Body mass index is 35.98 kg/m. Ideal Body Weight: Weight in (lb) to have BMI = 25: 149.9  GEN: no acute distress.  Obese, looks well otherwise  HEENT: Atraumatic, Normocephalic.  Ears and Nose: No external deformity. CV: RRR, No M/G/R. No  JVD. No thrill. No extra heart sounds. PULM: CTA B, no wheezes, crackles, rhonchi. No retractions. No resp. distress. No accessory muscle use. ABD: S, NT, ND, +BS. No rebound. No HSM. EXTR: No c/c/e PSYCH: Normally interactive. Conversant.  Foot exam: normal   BP Readings from Last 3 Encounters:  08/02/20 (!) 180/90  07/16/20 (!) 167/72  06/23/20 (!) 131/50    Assessment and Plan: Controlled type 2 diabetes mellitus without complication, without long-term current use of insulin ([place]) - Plan: Hemoglobin A1c  Postoperative hypothyroidism - Plan: TSH  Essential hypertension - Plan: losartan-hydrochlorothiazide (HYZAAR) 50-12.5 MG tablet, hydrochlorothiazide (MICROZIDE) 12.5 MG capsule  Mixed hyperlipidemia - Plan: Lipid panel  Fatigue, unspecified type - Plan: VITAMIN D 25 Hydroxy (Vit-D Deficiency, Fractures), TSH  Diverticulitis  Vitamin D deficiency - Plan: VITAMIN D 25 Hydroxy (Vit-D Deficiency, Fractures)  Patient seen today for routine visit.  We will follow-up on her diabetes and thyroid as above, also lipids, vitamin D-history of vitamin D deficiency  Noted that her blood pressure is high.  It appears that she was previously taking an extra 15 mg of hydrochlorothiazide, but she is not taking this currently.  I will have her continue losartan 50/HCTZ 12.5, and add an additional 12.5 mg of HCTZ.  She is seeing her cardiologist in about 3 weeks and her blood pressure can be checked at that time.  If any problems with electrolytes or side effects we can certainly decrease HCTZ and increase losartan  This visit occurred during the SARS-CoV-2 public health emergency.  Safety protocols were in place, including screening questions prior to the visit, additional usage of staff PPE, and extensive cleaning of exam room while observing appropriate contact time as indicated for disinfecting solutions.   Signed Lamar Blinks, MD  Received her labs as below 6/21-message to  patient  Results for orders placed or performed in visit on 08/02/20  Hemoglobin A1c  Result Value Ref Range   Hgb A1c MFr Bld 7.3 (H) 4.6 - 6.5 %  VITAMIN D 25 Hydroxy (Vit-D Deficiency, Fractures)  Result Value Ref Range   VITD 35.06 30.00 - 100.00 ng/mL  Lipid panel  Result Value Ref Range   Cholesterol 148 0 - 200 mg/dL   Triglycerides 113.0 0.0 - 149.0 mg/dL   HDL 50.20 >39.00 mg/dL   VLDL 22.6 0.0 - 40.0 mg/dL   LDL Cholesterol 75 0 - 99 mg/dL   Total CHOL/HDL Ratio 3    NonHDL 97.56   TSH  Result Value Ref Range   TSH 0.70 0.35 - 4.50 uIU/mL

## 2020-07-31 NOTE — Patient Instructions (Addendum)
It was great to see you again today!  Assuming all is well please see me in 6 months   We will check on your blood sugar and thyroid today Let's add an extra 12.5 mg of HCTZ to your regimen to bring your BP down a bit Continue losartan 50/ hctz 12.5 as well  Assuming all is well please see me in 6 months

## 2020-08-02 ENCOUNTER — Encounter: Payer: Self-pay | Admitting: Family Medicine

## 2020-08-02 ENCOUNTER — Other Ambulatory Visit: Payer: Self-pay

## 2020-08-02 ENCOUNTER — Ambulatory Visit (INDEPENDENT_AMBULATORY_CARE_PROVIDER_SITE_OTHER): Payer: Medicare Other | Admitting: Family Medicine

## 2020-08-02 VITALS — BP 160/80 | HR 82 | Temp 97.8°F | Ht 65.0 in | Wt 216.2 lb

## 2020-08-02 DIAGNOSIS — E782 Mixed hyperlipidemia: Secondary | ICD-10-CM

## 2020-08-02 DIAGNOSIS — E559 Vitamin D deficiency, unspecified: Secondary | ICD-10-CM | POA: Diagnosis not present

## 2020-08-02 DIAGNOSIS — E119 Type 2 diabetes mellitus without complications: Secondary | ICD-10-CM | POA: Diagnosis not present

## 2020-08-02 DIAGNOSIS — E890 Postprocedural hypothyroidism: Secondary | ICD-10-CM | POA: Diagnosis not present

## 2020-08-02 DIAGNOSIS — R5383 Other fatigue: Secondary | ICD-10-CM

## 2020-08-02 DIAGNOSIS — K5792 Diverticulitis of intestine, part unspecified, without perforation or abscess without bleeding: Secondary | ICD-10-CM | POA: Diagnosis not present

## 2020-08-02 DIAGNOSIS — I10 Essential (primary) hypertension: Secondary | ICD-10-CM

## 2020-08-02 MED ORDER — LOSARTAN POTASSIUM-HCTZ 50-12.5 MG PO TABS
1.0000 | ORAL_TABLET | Freq: Every day | ORAL | 3 refills | Status: DC
Start: 2020-08-02 — End: 2021-07-15

## 2020-08-02 MED ORDER — HYDROCHLOROTHIAZIDE 12.5 MG PO CAPS: 12.5000 mg | ORAL_CAPSULE | ORAL | 3 refills | Status: DC | PRN

## 2020-08-03 ENCOUNTER — Encounter: Payer: Self-pay | Admitting: Family Medicine

## 2020-08-03 LAB — LIPID PANEL
Cholesterol: 148 mg/dL (ref 0–200)
HDL: 50.2 mg/dL (ref >39.00)
LDL Cholesterol: 75 mg/dL (ref 0–99)
NonHDL: 97.56
Total CHOL/HDL Ratio: 3
Triglycerides: 113 mg/dL (ref 0.0–149.0)
VLDL: 22.6 mg/dL (ref 0.0–40.0)

## 2020-08-03 LAB — HEMOGLOBIN A1C: Hgb A1c MFr Bld: 7.3 % — ABNORMAL HIGH (ref 4.6–6.5)

## 2020-08-03 LAB — TSH: TSH: 0.7 u[IU]/mL (ref 0.35–4.50)

## 2020-08-03 LAB — VITAMIN D 25 HYDROXY (VIT D DEFICIENCY, FRACTURES): VITD: 35.06 ng/mL (ref 30.00–100.00)

## 2020-08-04 ENCOUNTER — Ambulatory Visit: Payer: Medicare Other | Admitting: Family Medicine

## 2020-08-05 IMAGING — NM NUCLEAR MEDICINE THREE PHASE BONE SCAN
3 series · 13 of 13 positions shown · non-contrast
Comparison: None.

CLINICAL DATA: Evaluate for loosening of right hip replacement.
Patient complains of bilateral medial and lateral hip pain with left
mid femur pain for 3-4 months. Bilateral leg swelling. Left hip
replacement 7887. Right hip replacement 5414.

EXAM:
NUCLEAR MEDICINE 3-PHASE BONE SCAN
TECHNIQUE: Radionuclide angiographic images, immediate static blood pool
images, and 3-hour delayed static images were obtained of the hips
after intravenous injection of radiopharmaceutical.
RADIOPHARMACEUTICALS:  21.0 mCi Oc-ZZm MDP IV

[Series 1: flow · 4.14mm/px · 6 of 37 frames shown (1 of 2)]
[frame 4/37  full-range]
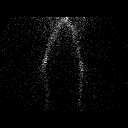
[frame 10/37  full-range]
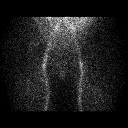
[frame 16/37  full-range]
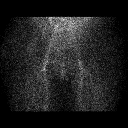
[frame 22/37  full-range]
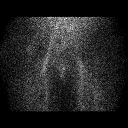
[frame 28/37  full-range]
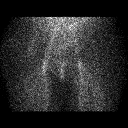
[frame 34/37  full-range]
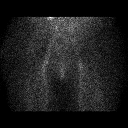

[Series 1: flow · 4.14mm/px · 6 of 37 frames shown (2 of 2)]
[frame 4/37]
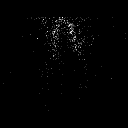
[frame 10/37  full-range]
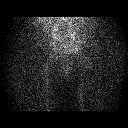
[frame 16/37  full-range]
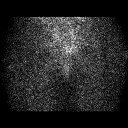
[frame 22/37  full-range]
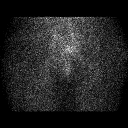
[frame 28/37  full-range]
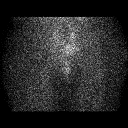
[frame 34/37  full-range]
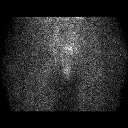

[Series 5: delay · delayed · 2.07mm/px · 1 of 1 slices shown]
[im 1/1]
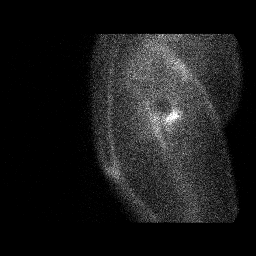

[13 of 13 positions shown; findings below may reference images not displayed]

FINDINGS: Vascular phase: Normal

Blood pool phase: Normal

Delayed phase: There is no abnormal uptake associated with the right
hip replacement to suggest loosening.

There is mild uptake in the left inferior acetabular region on
delayed imaging only. No other suspicious findings on the left.
IMPRESSION: 1. No evidence of right hip replacement loosening.
2. Mild uptake in the left inferior acetabular region on delayed
imaging only is a nonspecific finding. No evidence of loosening of
the femoral component on the left.

## 2020-08-26 ENCOUNTER — Telehealth: Payer: Self-pay | Admitting: Family Medicine

## 2020-08-26 NOTE — Telephone Encounter (Signed)
Left message for patient to schedule Annual Wellness Visit.  Please schedule with Health Nurse Advisor Augustine Radar. at [facility].

## 2020-09-30 ENCOUNTER — Telehealth: Payer: Self-pay | Admitting: Family Medicine

## 2020-09-30 NOTE — Telephone Encounter (Signed)
Patient is requesting for a letter of exemption from [place] duty for medical reasons. That didnm't want to mention . Jury duty date is  10/25/20. Patient will pick up.

## 2020-09-30 NOTE — Telephone Encounter (Signed)
Patient reports she is needing assistance getting up and down and walking.

## 2020-10-01 NOTE — Telephone Encounter (Signed)
Patient requesting jury duty excuse letter due to inability to ambulate without assistance. Please advise if patient needs ov or vv for letter.

## 2020-10-01 NOTE — Telephone Encounter (Signed)
Left message to inform patient.

## 2020-10-05 NOTE — Telephone Encounter (Signed)
Letter has been printed for patient to pick up.

## 2020-10-05 NOTE — Telephone Encounter (Signed)
Patient is requesting letter be printed Patient wants to pick up.

## 2020-11-18 ENCOUNTER — Other Ambulatory Visit: Payer: Self-pay | Admitting: Family Medicine

## 2020-11-18 DIAGNOSIS — Z1231 Encounter for screening mammogram for malignant neoplasm of breast: Secondary | ICD-10-CM

## 2020-12-09 DIAGNOSIS — Z23 Encounter for immunization: Secondary | ICD-10-CM | POA: Diagnosis not present

## 2020-12-17 ENCOUNTER — Ambulatory Visit
Admission: RE | Admit: 2020-12-17 | Discharge: 2020-12-17 | Disposition: A | Discharge status: Home / Self Care | Payer: Medicare Other | Source: Ambulatory Visit | Attending: Family Medicine | Admitting: Family Medicine

## 2020-12-17 ENCOUNTER — Other Ambulatory Visit: Payer: Self-pay

## 2020-12-17 DIAGNOSIS — Z1231 Encounter for screening mammogram for malignant neoplasm of breast: Secondary | ICD-10-CM | POA: Diagnosis not present

## 2020-12-30 ENCOUNTER — Telehealth: Payer: Self-pay | Admitting: Family Medicine

## 2020-12-30 NOTE — Telephone Encounter (Signed)
Left message for patient to call back and schedule Medicare Annual Wellness Visit (AWV) in office.   If not able to come in office, please offer to do virtually or by telephone.  Left office number and my jabber [phone].  Last AWV:09/21/2017  Please schedule at anytime with Nurse Health Advisor.

## 2021-01-13 DIAGNOSIS — Z23 Encounter for immunization: Secondary | ICD-10-CM | POA: Diagnosis not present

## 2021-01-31 ENCOUNTER — Ambulatory Visit (INDEPENDENT_AMBULATORY_CARE_PROVIDER_SITE_OTHER): Payer: Medicare Other

## 2021-01-31 VITALS — Ht 65.0 in | Wt 216.0 lb

## 2021-01-31 DIAGNOSIS — Z0000 Encounter for general adult medical examination without abnormal findings: Secondary | ICD-10-CM | POA: Diagnosis not present

## 2021-01-31 DIAGNOSIS — Z780 Asymptomatic menopausal state: Secondary | ICD-10-CM

## 2021-01-31 NOTE — Patient Instructions (Signed)
Anne [name] , Thank you for taking time to complete your Medicare Wellness Visit. I appreciate your ongoing commitment to your health goals. Please review the following plan we discussed and let me know if I can assist you in the future.   Screening recommendations/referrals: Colonoscopy: No longer required Mammogram: Completed 12/17/2020-Due 12/17/2021 Bone Density: Ordered today. Someone will call you to schedule. Recommended yearly ophthalmology/optometry visit for glaucoma screening and checkup Recommended yearly dental visit for hygiene and checkup  Vaccinations: Influenza vaccine: Up to date Pneumococcal vaccine: Up to date Tdap vaccine: Up to date Shingles vaccine: Completed vaccines   Covid-19:Up to date  Advanced directives: Declined information today.  Conditions/risks identified: See problem list  Next appointment: Follow up in one year for your annual wellness visit    Preventive Care 65 Years and Older, Female Preventive care refers to lifestyle choices and visits with your health care provider that can promote health and wellness. What does preventive care include? A yearly physical exam. This is also called an annual well check. Dental exams once or twice a year. Routine eye exams. Ask your health care provider how often you should have your eyes checked. Personal lifestyle choices, including: Daily care of your teeth and gums. Regular physical activity. Eating a healthy diet. Avoiding tobacco and drug use. Limiting alcohol use. Practicing safe sex. Taking low-dose aspirin every day. Taking vitamin and mineral supplements as recommended by your health care provider. What happens during an annual well check? The services and screenings done by your health care provider during your annual well check will depend on your age, overall health, lifestyle risk factors, and family history of disease. Counseling  Your health care provider may ask you questions about  your: Alcohol use. Tobacco use. Drug use. Emotional well-being. Home and relationship well-being. Sexual activity. Eating habits. History of falls. Memory and ability to understand (cognition). Work and work Statistician. Reproductive health. Screening  You may have the following tests or measurements: Height, weight, and BMI. Blood pressure. Lipid and cholesterol levels. These may be checked every 5 years, or more frequently if you are over 50 years old. Skin check. Lung cancer screening. You may have this screening every year starting at age 74 if you have a 30-pack-year history of smoking and currently smoke or have quit within the past 15 years. Fecal occult blood test (FOBT) of the stool. You may have this test every year starting at age 88. Flexible sigmoidoscopy or colonoscopy. You may have a sigmoidoscopy every 5 years or a colonoscopy every 10 years starting at age 55. Hepatitis C blood test. Hepatitis B blood test. Sexually transmitted disease (STD) testing. Diabetes screening. This is done by checking your blood sugar (glucose) after you have not eaten for a while (fasting). You may have this done every 1-3 years. Bone density scan. This is done to screen for osteoporosis. You may have this done starting at age 53. Mammogram. This may be done every 1-2 years. Talk to your health care provider about how often you should have regular mammograms. Talk with your health care provider about your test results, treatment options, and if necessary, the need for more tests. Vaccines  Your health care provider may recommend certain vaccines, such as: Influenza vaccine. This is recommended every year. Tetanus, diphtheria, and acellular pertussis (Tdap, Td) vaccine. You may need a Td booster every 10 years. Zoster vaccine. You may need this after age 31. Pneumococcal 13-valent conjugate (PCV13) vaccine. One dose is recommended after age 31.  Pneumococcal polysaccharide (PPSV23) vaccine.  One dose is recommended after age 21. Talk to your health care provider about which screenings and vaccines you need and how often you need them. This information is not intended to replace advice given to you by your health care provider. Make sure you discuss any questions you have with your health care provider. Document Released: 02/26/2015 Document Revised: 10/20/2015 Document Reviewed: 12/01/2014 Elsevier Interactive Patient Education  2017 [employer] Prevention in the Home Falls can cause injuries. They can happen to people of all ages. There are many things you can do to make your home safe and to help prevent falls. What can I do on the outside of my home? Regularly fix the edges of walkways and driveways and fix any cracks. Remove anything that might make you trip as you walk through a door, such as a raised step or threshold. Trim any bushes or trees on the path to your home. Use bright outdoor lighting. Clear any walking paths of anything that might make someone trip, such as rocks or tools. Regularly check to see if handrails are loose or broken. Make sure that both sides of any steps have handrails. Any raised decks and porches should have guardrails on the edges. Have any leaves, snow, or ice cleared regularly. Use sand or salt on walking paths during winter. Clean up any spills in your garage right away. This includes oil or grease spills. What can I do in the bathroom? Use night lights. Install grab bars by the toilet and in the tub and shower. Do not use towel bars as grab bars. Use non-skid mats or decals in the tub or shower. If you need to sit down in the shower, use a plastic, non-slip stool. Keep the floor dry. Clean up any water that spills on the floor as soon as it happens. Remove soap buildup in the tub or shower regularly. Attach bath mats securely with double-sided non-slip rug tape. Do not have throw rugs and other things on the floor that can make  you trip. What can I do in the bedroom? Use night lights. Make sure that you have a light by your bed that is easy to reach. Do not use any sheets or blankets that are too big for your bed. They should not hang down onto the floor. Have a firm chair that has side arms. You can use this for support while you get dressed. Do not have throw rugs and other things on the floor that can make you trip. What can I do in the kitchen? Clean up any spills right away. Avoid walking on wet floors. Keep items that you use a lot in easy-to-reach places. If you need to reach something above you, use a strong step stool that has a grab bar. Keep electrical cords out of the way. Do not use floor polish or wax that makes floors slippery. If you must use wax, use non-skid floor wax. Do not have throw rugs and other things on the floor that can make you trip. What can I do with my stairs? Do not leave any items on the stairs. Make sure that there are handrails on both sides of the stairs and use them. Fix handrails that are broken or loose. Make sure that handrails are as long as the stairways. Check any carpeting to make sure that it is firmly attached to the stairs. Fix any carpet that is loose or worn. Avoid having throw rugs at the  top or bottom of the stairs. If you do have throw rugs, attach them to the floor with carpet tape. Make sure that you have a light switch at the top of the stairs and the bottom of the stairs. If you do not have them, ask someone to add them for you. What else can I do to help prevent falls? Wear shoes that: Do not have high heels. Have rubber bottoms. Are comfortable and fit you well. Are closed at the toe. Do not wear sandals. If you use a stepladder: Make sure that it is fully opened. Do not climb a closed stepladder. Make sure that both sides of the stepladder are locked into place. Ask someone to hold it for you, if possible. Clearly mark and make sure that you can  see: Any grab bars or handrails. First and last steps. Where the edge of each step is. Use tools that help you move around (mobility aids) if they are needed. These include: Canes. Walkers. Scooters. Crutches. Turn on the lights when you go into a dark area. Replace any light bulbs as soon as they burn out. Set up your furniture so you have a clear path. Avoid moving your furniture around. If any of your floors are uneven, fix them. If there are any pets around you, be aware of where they are. Review your medicines with your doctor. Some medicines can make you feel dizzy. This can increase your chance of falling. Ask your doctor what other things that you can do to help prevent falls. This information is not intended to replace advice given to you by your health care provider. Make sure you discuss any questions you have with your health care provider. Document Released: 11/26/2008 Document Revised: 07/08/2015 Document Reviewed: 03/06/2014 Elsevier Interactive Patient Education  2017 [employer].

## 2021-01-31 NOTE — Progress Notes (Signed)
Subjective:   Anne [name] is a 80 y.o. female who presents for Medicare Annual (Subsequent) preventive examination.  I connected with Anne [name] today by telephone and verified that I am speaking with the correct person using two identifiers. Location patient: home Location provider: work Persons participating in the virtual visit: patient, Marine scientist.    I discussed the limitations, risks, security and privacy concerns of performing an evaluation and management service by telephone and the availability of in person appointments. I also discussed with the patient that there may be a patient responsible charge related to this service. The patient expressed understanding and verbally consented to this telephonic visit.    Interactive audio and video telecommunications were attempted between this provider and patient, however failed, due to patient having technical difficulties OR patient did not have access to video capability.  We continued and completed visit with audio only.  Some vital signs may be absent or patient reported.   Time Spent with patient on telephone encounter: 40 minutes   Review of Systems     Cardiac Risk Factors include: advanced age (>3men, >64 women);diabetes mellitus;dyslipidemia;hypertension;obesity (BMI >30kg/m2)     Objective:    Today's Vitals   01/31/21 1545  Weight: 216 lb (98 kg)  Height: 5\' 5"  (1.651 m)   Body mass index is 35.94 kg/m.  Advanced Directives 01/31/2021 07/16/2020 09/21/2017 02/15/2015  Does Patient Have a Medical Advance Directive? No No No No  Would patient like information on creating a medical advance directive? No - Patient declined Yes (ED - Information included in AVS) No - Patient declined -    Current Medications (verified) Outpatient Encounter Medications as of 01/31/2021  Medication Sig   acetaminophen (TYLENOL) 500 MG tablet Take 500 mg by mouth every 6 (six) hours as needed for mild pain.   aspirin 81 MG tablet Take 81 mg by  mouth at bedtime.    atorvastatin (LIPITOR) 20 MG tablet TAKE 1 TABLET(20 MG) BY MOUTH DAILY   b complex vitamins tablet Take 1 tablet by mouth daily.   Calcium-Phosphorus-Vitamin D (CITRACAL +D3 PO) Take 2 tablets by mouth daily.    Cholecalciferol (VITAMIN D-3) 5000 UNITS TABS Take 1 capsule by mouth daily.    famotidine (PEPCID) 20 MG tablet TAKE 1/2 TABLET(10 MG) BY MOUTH DAILY (Patient taking differently: Take 10 mg by mouth daily as needed.)   Glycerin-Hypromellose-PEG 400 ([facility] DRY EYE RELIEF OP) Apply 2 drops to eye as needed.    hydrochlorothiazide (MICROZIDE) 12.5 MG capsule Take 1 capsule (12.5 mg total) by mouth as needed. Take with losartan/hctz to equal 25 of hctz total   levothyroxine (SYNTHROID) 112 MCG tablet TAKE 1 TABLET(112 MCG) BY MOUTH DAILY   losartan-hydrochlorothiazide (HYZAAR) 50-12.5 MG tablet Take 1 tablet by mouth daily.   metFORMIN (GLUCOPHAGE) 500 MG tablet TAKE 1 TABLET BY MOUTH DAILY   trolamine salicylate (ASPERCREME) 10 % cream Apply 1 application topically as needed for muscle pain.   oxyCODONE-acetaminophen (PERCOCET/ROXICET) 5-325 MG tablet Take 0.5 tablets by mouth every 8 (eight) hours as needed for severe pain. (Patient not taking: Reported on 01/31/2021)   No facility-administered encounter medications on file as of 01/31/2021.    Allergies (verified) Amoxicillin   History: Past Medical History:  Diagnosis Date   Dental crowns status    01-14-2020 per pt had upper left front tooth abscess and has temporary crown from 2 wks ago,  pt stated abscess resolved and crown is not loose   Dyspnea  per pt gets sob at times with exertion   GERD (gastroesophageal reflux disease)    History of recent fall    per pt fell 01-08-2020 has bruised hip/ buttock / back area's and hit head ,  pt stated see provider, denies any dizziness, headache, or balance issues other than before   Hypertension    followed by pcp  (nulcear study in epic 03-06-2013 [facility]  perfusion w/ no ischemia, nuclear ef 74%)   Hypothyroidism, 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   followed by pcp---  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)   OA (osteoarthritis)    PMB (postmenopausal bleeding)    Thickened endometrium    Type 2 diabetes mellitus ([place])    followed by pcp   Wears glasses    Past Surgical History:  Procedure Laterality Date   CATARACT EXTRACTION W/ INTRAOCULAR LENS  IMPLANT, BILATERAL  2008   THYROIDECTOMY  12/25/2009   TONSILLECTOMY AND ADENOIDECTOMY  age 46   TOTAL HIP ARTHROPLASTY Bilateral left 10/07/2007;  right 06-03-2008  @[facility]    Family History  Problem Relation Age of Onset   Stroke Mother    Cancer Father    Cancer Sister    Cancer Sister    Cancer Sister    Miscarriages / Stillbirths Neg Hx    Breast cancer Neg Hx    Social History   Socioeconomic History   Marital status: Married    Spouse name: Anne [name]   Number of children: 1   Years of education: 14   Highest education level: Not on file  Occupational History    Employer: RETIRED  Tobacco Use   Smoking status: Never   Smokeless tobacco: Never  Vaping Use   Vaping Use: Never used  Substance and Sexual Activity   Alcohol use: No   Drug use: Never   Sexual activity: Not on file  Other Topics Concern   Not on file  Social History Narrative   Not on file   Social Determinants of Health   Financial Resource Strain: Low Risk    Difficulty of Paying Living Expenses: Not hard at all  Food Insecurity: No Food Insecurity   Worried About Charity fundraiser in the Last Year: Never true   Ran Out of Food in the Last Year: Never true  Transportation Needs: No Transportation Needs   Lack of Transportation (Medical): No   Lack of Transportation (Non-Medical): No  Physical Activity: Insufficiently Active   Days of Exercise per Week: 2 days   Minutes of Exercise per Session: 40 min  Stress: Not on file  Social Connections: Moderately Isolated   Frequency of Communication with  Friends and Family: More than three times a week   Frequency of Social Gatherings with Friends and Family: Once a week   Attends Religious Services: Never   Marine scientist or Organizations: No   Attends Music therapist: Never   Marital Status: Married    Tobacco Counseling Counseling given: Not Answered   Clinical Intake:  Pre-visit preparation completed: Yes  Pain : No/denies pain     BMI - recorded: 35.94 Nutritional Status: BMI > 30  Obese Nutritional Risks: None Diabetes: No  How often do you need to have someone help you when you read instructions, pamphlets, or other written materials from your doctor or pharmacy?: 1 - Never  Diabetes:  Is the patient diabetic?  Yes  If diabetic, was a CBG obtained today?  No  Did the patient bring  in their glucometer from home?  No phone visit How often do you monitor your CBG's? never.   Financial Strains and Diabetes Management:  Are you having any financial strains with the device, your supplies or your medication? No .  Does the patient want to be seen by Chronic Care Management for management of their diabetes?  No  Would the patient like to be referred to a Nutritionist or for Diabetic Management?  No    Diabetic Exams:  Diabetic Eye Exam:  Overdue for diabetic eye exam. Pt has been advised about the importance in completing this exam. Patient plans to make an appt soon  Diabetic Foot Exam: Completed 08/02/2020.   Interpreter Needed?: No  Information entered by :: Caroleen Hamman LPN   Activities of Daily Living In your present state of health, do you have any difficulty performing the following activities: 01/31/2021  Hearing? N  Vision? N  Difficulty concentrating or making decisions? N  Walking or climbing stairs? N  Dressing or bathing? N  Doing errands, shopping? Y  Comment husband goes with her  Conservation officer, nature and eating ? N  Using the Toilet? N  In the past six months, have you  accidently leaked urine? N  Do you have problems with loss of bowel control? N  Managing your Medications? N  Managing your Finances? N  Housekeeping or managing your Housekeeping? N  Some recent data might be hidden    Patient Care Team: [name], Gay Filler, MD as PCP - General (Family Medicine) Day, Melvenia Beam, [facility] (Inactive) as Pharmacist (Pharmacist)  Indicate any recent Medical Services you may have received from other than [facility] providers in the past year (date may be approximate).     Assessment:   This is a routine wellness examination for Anne [name].  Hearing/Vision screen Hearing Screening - Comments:: No issues Vision Screening - Comments:: Last eye exam-2021  Dietary issues and exercise activities discussed: Current Exercise Habits: Home exercise routine, Time (Minutes): 45, Frequency (Times/Week): 2, Weekly Exercise (Minutes/Week): 90, Intensity: Mild, Exercise limited by: None identified   Goals Addressed             This Visit's Progress    Patient Stated       Increase exercise & eat healthier       Depression Screen PHQ 2/9 Scores 01/31/2021 07/30/2019 09/21/2017 09/20/2016 11/10/2015 07/08/2015 03/10/2015  PHQ - 2 Score 0 0 0 0 0 0 0    Fall Risk Fall Risk  01/31/2021 08/02/2020 07/30/2019 09/21/2017 09/20/2016  Falls in the past year? 0 0 0 No No  Number falls in past yr: 0 0 0 - -  Injury with Fall? 0 0 - - -  Risk for fall due to : - Impaired balance/gait - - -  Follow up Falls prevention discussed Falls evaluation completed - - -    FALL RISK PREVENTION PERTAINING TO THE HOME:  Any stairs in or around the home? Yes  If so, are there any without handrails? No  Home free of loose throw rugs in walkways, pet beds, electrical cords, etc? Yes  Adequate lighting in your home to reduce risk of falls? Yes   ASSISTIVE DEVICES UTILIZED TO PREVENT FALLS:  Life alert? No  Use of a cane, walker or w/c? Yes cane occasionally Grab bars in the bathroom? No  Shower chair  or bench in shower? Yes  Elevated toilet seat or a handicapped toilet? Yes   TIMED UP AND GO:  Was the test performed?  No .phone visit    Cognitive Function:Normal cognitive status assessed by this Nurse Health Advisor. No abnormalities found.   MMSE - Mini Mental State Exam 09/21/2017  Orientation to time 5  Orientation to Place 5  Registration 3  Attention/ Calculation 5  Recall 3  Language- name 2 objects 2  Language- repeat 0  Language- follow 3 step command 3  Language- read & follow direction 1  Write a sentence 1  Copy design 1  Total score 29        Immunizations Immunization History  Administered Date(s) Administered   Fluad Quad(high Dose 65+) 12/15/2018   Influenza Split 01/04/2012   Influenza, High Dose Seasonal PF 11/10/2015   Influenza,inj,Quad PF,6+ Mos 01/22/2014   Influenza-Unspecified 12/14/2012, 09/27/2017, 12/31/2019, 01/08/2021   PFIZER(Purple Top)SARS-COV-2 Vaccination 04/06/2019, 04/29/2019   Pfizer Covid-19 Vaccine Bivalent Booster 51yrs & up 01/13/2021   Pneumococcal Conjugate-13 01/22/2014   Pneumococcal Polysaccharide-23 01/04/2012   Td 01/22/2014   Zoster Recombinat (Shingrix) 03/14/2019, 04/21/2019    TDAP status: Up to date  Flu Vaccine status: Up to date  Pneumococcal vaccine status: Up to date  Covid-19 vaccine status: Completed vaccines  Qualifies for Shingles Vaccine? No   Zostavax completed No   Shingrix Completed?: Yes  Screening Tests Health Maintenance  Topic Date Due   OPHTHALMOLOGY EXAM  12/22/2020   HEMOGLOBIN A1C  02/01/2021   COVID-19 Vaccine (4 - Booster for Pfizer series) 03/10/2021   FOOT EXAM  08/02/2021   TETANUS/TDAP  01/23/2024   Pneumonia Vaccine 77+ Years old  Completed   INFLUENZA VACCINE  Completed   DEXA SCAN  Completed   Zoster Vaccines- Shingrix  Completed   HPV VACCINES  Aged Out    Health Maintenance  Health Maintenance Due  Topic Date Due   OPHTHALMOLOGY EXAM  12/22/2020     Colorectal cancer screening: No longer required.   Mammogram status: Completed bilateral 12/17/2020. Repeat every year  Bone Density status: Ordered today. Pt provided with contact info and advised to call to schedule appt.  Lung Cancer Screening: (Low Dose CT Chest recommended if Age 21-80 years, 30 pack-year currently smoking OR have quit w/in 15years.) does not qualify.     Additional Screening:  Hepatitis C Screening: does not qualify  Vision Screening: Recommended annual ophthalmology exams for early detection of glaucoma and other disorders of the eye. Is the patient up to date with their annual eye exam?  No  Who is the provider or what is the name of the office in which the patient attends annual eye exams? Dr. Satira Sark   Dental Screening: Recommended annual dental exams for proper oral hygiene  Community Resource Referral / Chronic Care Management: CRR required this visit?  No   CCM required this visit?  No      Plan:     I have personally reviewed and noted the following in the patients chart:   Medical and social history Use of alcohol, tobacco or illicit drugs  Current medications and supplements including opioid prescriptions.  Functional ability and status Nutritional status Physical activity Advanced directives List of other physicians Hospitalizations, surgeries, and ER visits in previous 12 months Vitals Screenings to include cognitive, depression, and falls Referrals and appointments  In addition, I have reviewed and discussed with patient certain preventive protocols, quality metrics, and best practice recommendations. A written personalized care plan for preventive services as well as general preventive health recommendations were provided to patient.   Due to this being a telephonic  visit, the after visit summary with patients personalized plan was offered to patient via mail or my-chart. Patient would like to access on my-chart.   Marta Antu, LPN   22/30/0979  Nurse Health Advisor  Nurse Notes: None

## 2021-02-02 ENCOUNTER — Ambulatory Visit: Payer: Medicare Other | Admitting: Family Medicine

## 2021-02-04 NOTE — Progress Notes (Deleted)
[facility] [address] [place], [place] [address] [phone]  Date:  02/10/2021   Name:  Anne [name]   DOB:  1941/01/27   MRN:  [id number]  PCP:  Darreld Mclean, MD    Chief Complaint: No chief complaint on file.   History of Present Illness:  Anne [name] is a 80 y.o. very pleasant female patient who presents with the following:  Periodic follow-up visit Lat seen by myself in June History of controlled diabetes, hyperlipidemia, hypertension, hypothyroidism Thyroid cancer status post thyroidectomy in 2011 Married to [place] She has a grandson- Hydrologist- who has cancer   Eye exam A1c is due Lab Results  Component Value Date   HGBA1C 7.3 (H) 08/02/2020    Patient Active Problem List   Diagnosis Date Noted   Diverticulitis 08/02/2020   Controlled type 2 diabetes mellitus without complication, without long-term current use of insulin ([place]) 03/31/2015   Chest pressure 03/03/2013   Globus sensation 03/03/2013   DOE (dyspnea on exertion) 03/03/2013   Thyroid cancer ([place]) 01/05/2012   Hyperlipidemia 01/05/2012   Dysphonia 01/05/2012   Osteoarthritis, hip, 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   Hypertension 01/04/2012   Hypothyroid 01/04/2012    Past Medical History:  Diagnosis Date   Dental crowns status    01-14-2020 per pt had upper left front tooth abscess and has temporary crown from 2 wks ago,  pt stated abscess resolved and crown is not loose   Dyspnea    per pt gets sob at times with exertion   GERD (gastroesophageal reflux disease)    History of recent fall    per pt fell 01-08-2020 has bruised hip/ buttock / back area's and hit head ,  pt stated see provider, denies any dizziness, headache, or balance issues other than before   Hypertension    followed by pcp  (nulcear study in epic 03-06-2013 [facility] perfusion w/ no ischemia, nuclear ef 74%)   Hypothyroidism, 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   followed by  pcp---  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)   OA (osteoarthritis)    PMB (postmenopausal bleeding)    Thickened endometrium    Type 2 diabetes mellitus ([place])    followed by pcp   Wears glasses     Past Surgical History:  Procedure Laterality Date   CATARACT EXTRACTION W/ INTRAOCULAR LENS  IMPLANT, BILATERAL  2008   THYROIDECTOMY  12/25/2009   TONSILLECTOMY AND ADENOIDECTOMY  age 5   TOTAL HIP ARTHROPLASTY Bilateral left 10/07/2007;  right 06-03-2008  @[facility]     Social History   Tobacco Use   Smoking status: Never   Smokeless tobacco: Never  Vaping Use   Vaping Use: Never used  Substance Use Topics   Alcohol use: No   Drug use: Never    Family History  Problem Relation Age of Onset   Stroke Mother    Cancer Father    Cancer Sister    Cancer Sister    Cancer Sister    Miscarriages / Stillbirths Neg Hx    Breast cancer Neg Hx     Allergies  Allergen Reactions   Amoxicillin Itching    vaginal itching and bleeding (side effect)    Medication list has been reviewed and updated.  Current Outpatient Medications on File Prior to Visit  Medication Sig Dispense Refill   acetaminophen (TYLENOL) 500 MG tablet Take 500 mg by mouth every 6 (six) hours as needed  for mild pain.     aspirin 81 MG tablet Take 81 mg by mouth at bedtime.      atorvastatin (LIPITOR) 20 MG tablet TAKE 1 TABLET(20 MG) BY MOUTH DAILY 90 tablet 3   b complex vitamins tablet Take 1 tablet by mouth daily.     Calcium-Phosphorus-Vitamin D (CITRACAL +D3 PO) Take 2 tablets by mouth daily.      Cholecalciferol (VITAMIN D-3) 5000 UNITS TABS Take 1 capsule by mouth daily.      famotidine (PEPCID) 20 MG tablet TAKE 1/2 TABLET(10 MG) BY MOUTH DAILY (Patient taking differently: Take 10 mg by mouth daily as needed.) 45 tablet 3   Glycerin-Hypromellose-PEG 400 ([facility] DRY EYE RELIEF OP) Apply 2 drops to eye as needed.      hydrochlorothiazide (MICROZIDE) 12.5 MG capsule Take 1 capsule (12.5 mg total) by  mouth as needed. Take with losartan/hctz to equal 25 of hctz total 90 capsule 3   levothyroxine (SYNTHROID) 112 MCG tablet TAKE 1 TABLET(112 MCG) BY MOUTH DAILY 90 tablet 3   losartan-hydrochlorothiazide (HYZAAR) 50-12.5 MG tablet Take 1 tablet by mouth daily. 90 tablet 3   metFORMIN (GLUCOPHAGE) 500 MG tablet TAKE 1 TABLET BY MOUTH DAILY 90 tablet 3   oxyCODONE-acetaminophen (PERCOCET/ROXICET) 5-325 MG tablet Take 0.5 tablets by mouth every 8 (eight) hours as needed for severe pain. (Patient not taking: Reported on 01/31/2021) 3 tablet 0   trolamine salicylate (ASPERCREME) 10 % cream Apply 1 application topically as needed for muscle pain.     No current facility-administered medications on file prior to visit.    Review of Systems:  As per HPI- otherwise negative.   Physical Examination: There were no vitals filed for this visit. There were no vitals filed for this visit. There is no height or weight on file to calculate BMI. Ideal Body Weight:    GEN: no acute distress. HEENT: Atraumatic, Normocephalic.  Ears and Nose: No external deformity. CV: RRR, No M/G/R. No JVD. No thrill. No extra heart sounds. PULM: CTA B, no wheezes, crackles, rhonchi. No retractions. No resp. distress. No accessory muscle use. ABD: S, NT, ND, +BS. No rebound. No HSM. EXTR: No c/c/e PSYCH: Normally interactive. Conversant.    Assessment and Plan: ***  Signed Lamar Blinks, MD

## 2021-02-04 NOTE — Patient Instructions (Incomplete)
Good to see you today- I will be in touch with your labs Assuming all is well please see me in about 6 months

## 2021-02-10 ENCOUNTER — Ambulatory Visit: Payer: Medicare Other | Admitting: Family Medicine

## 2021-02-21 NOTE — Progress Notes (Deleted)
[place] at [facility] [address], [place], [place] [address] [phone]  Date:  02/23/2021   Name:  Anne [name]   DOB:  Apr 23, 1940   MRN:  [id number]  PCP:  Darreld Mclean, MD    Chief Complaint: No chief complaint on file.   History of Present Illness:  Anne [name] is a 81 y.o. very pleasant female patient who presents with the following:  Patient seen today for 5-month follow-up Most recent visit with myself was in June- History of controlled diabetes, hyperlipidemia, hypertension, hypothyroidism, diverticulitis in May of last year Thyroid cancer status post thyroidectomy in 2011  Married to [facility] exam Due for A1c Immunizations are up-to-date Some labs done in June, lipid, vitamin D, A1c, TSH Mammogram up-to-date Can update DEXA scan  Aspirin 81 Lipitor 20 Pepcid as needed HCTZ, losartan Levothyroxine Metformin  Lab Results  Component Value Date   HGBA1C 7.3 (H) 08/02/2020      Patient Active Problem List   Diagnosis Date Noted   Diverticulitis 08/02/2020   Controlled type 2 diabetes mellitus without complication, without long-term current use of insulin ([place]) 03/31/2015   Chest pressure 03/03/2013   Globus sensation 03/03/2013   DOE (dyspnea on exertion) 03/03/2013   Thyroid cancer ([place]) 01/05/2012   Hyperlipidemia 01/05/2012   Dysphonia 01/05/2012   Osteoarthritis, hip, 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   Hypertension 01/04/2012   Hypothyroid 01/04/2012    Past Medical History:  Diagnosis Date   Dental crowns status    01-14-2020 per pt had upper left front tooth abscess and has temporary crown from 2 wks ago,  pt stated abscess resolved and crown is not loose   Dyspnea    per pt gets sob at times with exertion   GERD (gastroesophageal reflux disease)    History of recent fall    per pt fell 01-08-2020 has bruised hip/ buttock / back area's and hit head ,  pt stated see provider, denies  any dizziness, headache, or balance issues other than before   Hypertension    followed by pcp  (nulcear study in epic 03-06-2013 normwl perfusion w/ no ischemia, nuclear ef 74%)   Hypothyroidism, 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   followed by pcp---  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)   OA (osteoarthritis)    PMB (postmenopausal bleeding)    Thickened endometrium    Type 2 diabetes mellitus ([place])    followed by pcp   Wears glasses     Past Surgical History:  Procedure Laterality Date   CATARACT EXTRACTION W/ INTRAOCULAR LENS  IMPLANT, BILATERAL  2008   THYROIDECTOMY  12/25/2009   TONSILLECTOMY AND ADENOIDECTOMY  age 73   TOTAL HIP ARTHROPLASTY Bilateral left 10/07/2007;  right 06-03-2008  @[facility]     Social History   Tobacco Use   Smoking status: Never   Smokeless tobacco: Never  Vaping Use   Vaping Use: Never used  Substance Use Topics   Alcohol use: No   Drug use: Never    Family History  Problem Relation Age of Onset   Stroke Mother    Cancer Father    Cancer Sister    Cancer Sister    Cancer Sister    Miscarriages / Stillbirths Neg Hx    Breast cancer Neg Hx     Allergies  Allergen Reactions   Amoxicillin Itching    vaginal itching and bleeding (side effect)    Medication list  has been reviewed and updated.  Current Outpatient Medications on File Prior to Visit  Medication Sig Dispense Refill   acetaminophen (TYLENOL) 500 MG tablet Take 500 mg by mouth every 6 (six) hours as needed for mild pain.     aspirin 81 MG tablet Take 81 mg by mouth at bedtime.      atorvastatin (LIPITOR) 20 MG tablet TAKE 1 TABLET(20 MG) BY MOUTH DAILY 90 tablet 3   b complex vitamins tablet Take 1 tablet by mouth daily.     Calcium-Phosphorus-Vitamin D (CITRACAL +D3 PO) Take 2 tablets by mouth daily.      Cholecalciferol (VITAMIN D-3) 5000 UNITS TABS Take 1 capsule by mouth daily.      famotidine (PEPCID) 20 MG tablet TAKE 1/2 TABLET(10 MG) BY MOUTH DAILY (Patient taking  differently: Take 10 mg by mouth daily as needed.) 45 tablet 3   Glycerin-Hypromellose-PEG 400 ([employer] DRY EYE RELIEF OP) Apply 2 drops to eye as needed.      hydrochlorothiazide (MICROZIDE) 12.5 MG capsule Take 1 capsule (12.5 mg total) by mouth as needed. Take with losartan/hctz to equal 25 of hctz total 90 capsule 3   levothyroxine (SYNTHROID) 112 MCG tablet TAKE 1 TABLET(112 MCG) BY MOUTH DAILY 90 tablet 3   losartan-hydrochlorothiazide (HYZAAR) 50-12.5 MG tablet Take 1 tablet by mouth daily. 90 tablet 3   metFORMIN (GLUCOPHAGE) 500 MG tablet TAKE 1 TABLET BY MOUTH DAILY 90 tablet 3   oxyCODONE-acetaminophen (PERCOCET/ROXICET) 5-325 MG tablet Take 0.5 tablets by mouth every 8 (eight) hours as needed for severe pain. (Patient not taking: Reported on 01/31/2021) 3 tablet 0   trolamine salicylate (ASPERCREME) 10 % cream Apply 1 application topically as needed for muscle pain.     No current facility-administered medications on file prior to visit.    Review of Systems:  As per HPI- otherwise negative.   Physical Examination: There were no vitals filed for this visit. There were no vitals filed for this visit. There is no height or weight on file to calculate BMI. Ideal Body Weight:    GEN: no acute distress. HEENT: Atraumatic, Normocephalic.  Ears and Nose: No external deformity. CV: RRR, No M/G/R. No JVD. No thrill. No extra heart sounds. PULM: CTA B, no wheezes, crackles, rhonchi. No retractions. No resp. distress. No accessory muscle use. ABD: S, NT, ND, +BS. No rebound. No HSM. EXTR: No c/c/e PSYCH: Normally interactive. Conversant.    Assessment and Plan: ***  Signed Lamar Blinks, MD

## 2021-02-21 NOTE — Patient Instructions (Addendum)
It was great to see you again today, I will be in touch with your labs.  Assuming all is well please see me in 6 months  Do try to work on a lower salt diet and see if you can lost a few lbs- we may need to increase your BP meds a bit as your blood pressure is too high

## 2021-02-23 ENCOUNTER — Ambulatory Visit (INDEPENDENT_AMBULATORY_CARE_PROVIDER_SITE_OTHER): Payer: Medicare Other | Admitting: Family Medicine

## 2021-02-23 VITALS — BP 150/85 | HR 84 | Temp 98.3°F | Resp 18 | Ht 65.0 in | Wt 221.8 lb

## 2021-02-23 DIAGNOSIS — E782 Mixed hyperlipidemia: Secondary | ICD-10-CM | POA: Diagnosis not present

## 2021-02-23 DIAGNOSIS — E890 Postprocedural hypothyroidism: Secondary | ICD-10-CM | POA: Diagnosis not present

## 2021-02-23 DIAGNOSIS — E119 Type 2 diabetes mellitus without complications: Secondary | ICD-10-CM

## 2021-02-23 DIAGNOSIS — I10 Essential (primary) hypertension: Secondary | ICD-10-CM

## 2021-02-23 DIAGNOSIS — E2839 Other primary ovarian failure: Secondary | ICD-10-CM

## 2021-02-23 NOTE — Progress Notes (Addendum)
[place] at [facility], [place],  [address] [phone]  Date:  02/23/2021   Name:  Anne [name]   DOB:  Jun 16, 1940   MRN:  [id number]  PCP:  Darreld Mclean, MD    Chief Complaint: 6 month follow up (Concerns/ questions: none/Eye exam: will make an exam)   History of Present Illness:  Anne [name] is a 81 y.o. very pleasant female patient who presents with the following:  Patient seen today for 70-month follow-up Most recent visit with myself was in June- History of controlled diabetes, hyperlipidemia, hypertension, hypothyroidism, diverticulitis in May of last year Thyroid cancer status post thyroidectomy in 2011  Married to [facility] exam- she will arrange this  Due for A1c Immunizations are up-to-date Some labs done in June, lipid, vitamin D, A1c, TSH Mammogram up-to-date Can update DEXA scan- will order   She uses a cane, no falls   Aspirin 81 Lipitor 20 Pepcid as needed HCTZ, losartan Levothyroxine Metformin  Lab Results  Component Value Date   HGBA1C 7.3 (H) 08/02/2020      Patient Active Problem List   Diagnosis Date Noted   Diverticulitis 08/02/2020   Controlled type 2 diabetes mellitus without complication, without long-term current use of insulin ([place]) 03/31/2015   Chest pressure 03/03/2013   Globus sensation 03/03/2013   DOE (dyspnea on exertion) 03/03/2013   Thyroid cancer ([place]) 01/05/2012   Hyperlipidemia 01/05/2012   Dysphonia 01/05/2012   Osteoarthritis, hip, 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   Hypertension 01/04/2012   Hypothyroid 01/04/2012    Past Medical History:  Diagnosis Date   Dental crowns status    01-14-2020 per pt had upper left front tooth abscess and has temporary crown from 2 wks ago,  pt stated abscess resolved and crown is not loose   Dyspnea    per pt gets sob at times with exertion   GERD (gastroesophageal reflux disease)    History of recent fall     per pt fell 01-08-2020 has bruised hip/ buttock / back area's and hit head ,  pt stated see provider, denies any dizziness, headache, or balance issues other than before   Hypertension    followed by pcp  (nulcear study in epic 03-06-2013 normwl perfusion w/ no ischemia, nuclear ef 74%)   Hypothyroidism, 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   followed by pcp---  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)   OA (osteoarthritis)    PMB (postmenopausal bleeding)    Thickened endometrium    Type 2 diabetes mellitus ([place])    followed by pcp   Wears glasses     Past Surgical History:  Procedure Laterality Date   CATARACT EXTRACTION W/ INTRAOCULAR LENS  IMPLANT, BILATERAL  2008   THYROIDECTOMY  12/25/2009   TONSILLECTOMY AND ADENOIDECTOMY  age 40   TOTAL HIP ARTHROPLASTY Bilateral left 10/07/2007;  right 06-03-2008  @[facility]     Social History   Tobacco Use   Smoking status: Never   Smokeless tobacco: Never  Vaping Use   Vaping Use: Never used  Substance Use Topics   Alcohol use: No   Drug use: Never    Family History  Problem Relation Age of Onset   Stroke Mother    Cancer Father    Cancer Sister    Cancer Sister    Cancer Sister    Miscarriages / Stillbirths Neg Hx    Breast cancer Neg Hx  Allergies  Allergen Reactions   Amoxicillin Itching    vaginal itching and bleeding (side effect)    Medication list has been reviewed and updated.  Current Outpatient Medications on File Prior to Visit  Medication Sig Dispense Refill   acetaminophen (TYLENOL) 500 MG tablet Take 500 mg by mouth every 6 (six) hours as needed for mild pain.     aspirin 81 MG tablet Take 81 mg by mouth at bedtime.      atorvastatin (LIPITOR) 20 MG tablet TAKE 1 TABLET(20 MG) BY MOUTH DAILY 90 tablet 3   b complex vitamins tablet Take 1 tablet by mouth daily.     Calcium-Phosphorus-Vitamin D (CITRACAL +D3 PO) Take 2 tablets by mouth daily.      Cholecalciferol (VITAMIN D-3) 5000 UNITS TABS Take 1 capsule  by mouth daily.      famotidine (PEPCID) 20 MG tablet TAKE 1/2 TABLET(10 MG) BY MOUTH DAILY (Patient taking differently: Take 10 mg by mouth daily as needed.) 45 tablet 3   Glycerin-Hypromellose-PEG 400 ([employer] DRY EYE RELIEF OP) Apply 2 drops to eye as needed.      hydrochlorothiazide (MICROZIDE) 12.5 MG capsule Take 1 capsule (12.5 mg total) by mouth as needed. Take with losartan/hctz to equal 25 of hctz total 90 capsule 3   levothyroxine (SYNTHROID) 112 MCG tablet TAKE 1 TABLET(112 MCG) BY MOUTH DAILY 90 tablet 3   losartan-hydrochlorothiazide (HYZAAR) 50-12.5 MG tablet Take 1 tablet by mouth daily. 90 tablet 3   metFORMIN (GLUCOPHAGE) 500 MG tablet TAKE 1 TABLET BY MOUTH DAILY 90 tablet 3   oxyCODONE-acetaminophen (PERCOCET/ROXICET) 5-325 MG tablet Take 0.5 tablets by mouth every 8 (eight) hours as needed for severe pain. 3 tablet 0   trolamine salicylate (ASPERCREME) 10 % cream Apply 1 application topically as needed for muscle pain.     No current facility-administered medications on file prior to visit.    Review of Systems:  As per HPI- otherwise negative. BP Readings from Last 3 Encounters:  02/23/21 (!) 150/85  08/02/20 (!) 160/80  07/16/20 (!) 167/72      Physical Examination: Vitals:   02/23/21 1522 02/23/21 1540  BP: (!) 162/90 (!) 150/85  Pulse: 84   Resp: 18   Temp: 98.3 F (36.8 C)   SpO2: 96%    Vitals:   02/23/21 1522  Weight: 221 lb 12.8 oz (100.6 kg)  Height: 5\' 5"  (1.651 m)   Body mass index is 36.91 kg/m. Ideal Body Weight: Weight in (lb) to have BMI = 25: 149.9  GEN: no acute distress.  Obese, looks well HEENT: Atraumatic, Normocephalic.  Ears and Nose: No external deformity. CV: RRR, No M/G/R. No JVD. No thrill. No extra heart sounds. PULM: CTA B, no wheezes, crackles, rhonchi. No retractions. No resp. distress. No accessory muscle use. ABD: S, NT, ND, +BS. No rebound. No HSM. EXTR: No c/c/e PSYCH: Normally interactive. Conversant.  Uses cane  for ambulation  Assessment and Plan: Controlled type 2 diabetes mellitus without complication, without long-term current use of insulin (HCC) - Plan: Hemoglobin A1c  Postoperative hypothyroidism - Plan: TSH  Essential hypertension - Plan: CBC, Comprehensive metabolic panel  Mixed hyperlipidemia  Estrogen deficiency - Plan: DG Bone Density Patient seen today for follow-up.  Await labs as above Ordered bone density Discussed her blood pressure, I explained to her pressure has been too high on the last several visits and I would like to adjust her blood pressure medication.  However for now she declines to change  medication, she would like to work first on her diet-this is certainly up to her  Will plan further follow- up pending labs. I asked her to follow-up in 6 months assuming all is well  Signed Lamar Blinks, MD  Addendum 1/12, received labs as below.  Message to patient  Results for orders placed or performed in visit on 02/23/21  CBC  Result Value Ref Range   WBC 5.4 4.0 - 10.5 K/uL   RBC 4.12 3.87 - 5.11 Mil/uL   Platelets 232.0 150.0 - 400.0 K/uL   Hemoglobin 13.4 12.0 - 15.0 g/dL   HCT 39.4 36.0 - 46.0 %   MCV 95.8 78.0 - 100.0 fl   MCHC 34.0 30.0 - 36.0 g/dL   RDW 14.1 11.5 - 15.5 %  Comprehensive metabolic panel  Result Value Ref Range   Sodium 140 135 - 145 mEq/L   Potassium 3.7 3.5 - 5.1 mEq/L   Chloride 100 96 - 112 mEq/L   CO2 29 19 - 32 mEq/L   Glucose, Bld 117 (H) 70 - 99 mg/dL   BUN 14 6 - 23 mg/dL   Creatinine, Ser 0.83 0.40 - 1.20 mg/dL   Total Bilirubin 0.6 0.2 - 1.2 mg/dL   Alkaline Phosphatase 81 39 - 117 U/L   AST 25 0 - 37 U/L   ALT 23 0 - 35 U/L   Total Protein 7.6 6.0 - 8.3 g/dL   Albumin 4.3 3.5 - 5.2 g/dL   GFR 66.35 >60.00 mL/min   Calcium 10.0 8.4 - 10.5 mg/dL  Hemoglobin A1c  Result Value Ref Range   Hgb A1c MFr Bld 7.6 (H) 4.6 - 6.5 %  TSH  Result Value Ref Range   TSH 2.59 0.35 - 5.50 uIU/mL

## 2021-02-24 ENCOUNTER — Encounter: Payer: Self-pay | Admitting: Family Medicine

## 2021-02-24 LAB — CBC
HCT: 39.4 % (ref 36.0–46.0)
Hemoglobin: 13.4 g/dL (ref 12.0–15.0)
MCHC: 34 g/dL (ref 30.0–36.0)
MCV: 95.8 fl (ref 78.0–100.0)
Platelets: 232 10*3/uL (ref 150.0–400.0)
RBC: 4.12 Mil/uL (ref 3.87–5.11)
RDW: 14.1 % (ref 11.5–15.5)
WBC: 5.4 10*3/uL (ref 4.0–10.5)

## 2021-02-24 LAB — COMPREHENSIVE METABOLIC PANEL
ALT: 23 U/L (ref 0–35)
AST: 25 U/L (ref 0–37)
Albumin: 4.3 g/dL (ref 3.5–5.2)
Alkaline Phosphatase: 81 U/L (ref 39–117)
BUN: 14 mg/dL (ref 6–23)
CO2: 29 mEq/L (ref 19–32)
Calcium: 10 mg/dL (ref 8.4–10.5)
Chloride: 100 mEq/L (ref 96–112)
Creatinine, Ser: 0.83 mg/dL (ref 0.40–1.20)
GFR: 66.35 mL/min (ref >60.00)
Glucose, Bld: 117 mg/dL — ABNORMAL HIGH (ref 70–99)
Potassium: 3.7 mEq/L (ref 3.5–5.1)
Sodium: 140 mEq/L (ref 135–145)
Total Bilirubin: 0.6 mg/dL (ref 0.2–1.2)
Total Protein: 7.6 g/dL (ref 6.0–8.3)

## 2021-02-24 LAB — TSH: TSH: 2.59 u[IU]/mL (ref 0.35–5.50)

## 2021-02-24 LAB — HEMOGLOBIN A1C: Hgb A1c MFr Bld: 7.6 % — ABNORMAL HIGH (ref 4.6–6.5)

## 2021-02-26 ENCOUNTER — Telehealth (HOSPITAL_BASED_OUTPATIENT_CLINIC_OR_DEPARTMENT_OTHER): Payer: Self-pay

## 2021-03-03 ENCOUNTER — Other Ambulatory Visit: Payer: Self-pay

## 2021-03-03 ENCOUNTER — Ambulatory Visit (HOSPITAL_BASED_OUTPATIENT_CLINIC_OR_DEPARTMENT_OTHER)
Admission: RE | Admit: 2021-03-03 | Discharge: 2021-03-03 | Disposition: A | Discharge status: Home / Self Care | Payer: Medicare Other | Source: Ambulatory Visit | Attending: Family Medicine | Admitting: Family Medicine

## 2021-03-03 ENCOUNTER — Encounter: Payer: Self-pay | Admitting: Family Medicine

## 2021-03-03 DIAGNOSIS — Z780 Asymptomatic menopausal state: Secondary | ICD-10-CM | POA: Diagnosis not present

## 2021-03-03 DIAGNOSIS — E2839 Other primary ovarian failure: Secondary | ICD-10-CM | POA: Insufficient documentation

## 2021-03-17 ENCOUNTER — Telehealth: Payer: Self-pay | Admitting: Family Medicine

## 2021-03-17 NOTE — Telephone Encounter (Signed)
Mailed to pt per request

## 2021-03-17 NOTE — Telephone Encounter (Signed)
Pt called to go over most recent labs and bone density scan. Please advise.

## 2021-04-08 DIAGNOSIS — D23121 Other benign neoplasm of skin of left upper eyelid, including canthus: Secondary | ICD-10-CM | POA: Diagnosis not present

## 2021-04-08 DIAGNOSIS — H25011 Cortical age-related cataract, right eye: Secondary | ICD-10-CM | POA: Diagnosis not present

## 2021-04-08 DIAGNOSIS — H43813 Vitreous degeneration, bilateral: Secondary | ICD-10-CM | POA: Diagnosis not present

## 2021-04-08 DIAGNOSIS — H524 Presbyopia: Secondary | ICD-10-CM | POA: Diagnosis not present

## 2021-04-08 LAB — HM DIABETES EYE EXAM

## 2021-04-28 DIAGNOSIS — H2511 Age-related nuclear cataract, right eye: Secondary | ICD-10-CM | POA: Diagnosis not present

## 2021-04-28 DIAGNOSIS — H25011 Cortical age-related cataract, right eye: Secondary | ICD-10-CM | POA: Diagnosis not present

## 2021-04-28 DIAGNOSIS — H25811 Combined forms of age-related cataract, right eye: Secondary | ICD-10-CM | POA: Diagnosis not present

## 2021-06-01 DIAGNOSIS — M25551 Pain in right hip: Secondary | ICD-10-CM | POA: Diagnosis not present

## 2021-06-01 DIAGNOSIS — M79621 Pain in right upper arm: Secondary | ICD-10-CM | POA: Diagnosis not present

## 2021-07-15 ENCOUNTER — Other Ambulatory Visit: Payer: Self-pay | Admitting: Family Medicine

## 2021-07-15 DIAGNOSIS — E890 Postprocedural hypothyroidism: Secondary | ICD-10-CM

## 2021-07-15 DIAGNOSIS — E782 Mixed hyperlipidemia: Secondary | ICD-10-CM

## 2021-07-15 DIAGNOSIS — I10 Essential (primary) hypertension: Secondary | ICD-10-CM

## 2021-08-17 NOTE — Patient Instructions (Addendum)
It was great to see you again today, assuming all is well please see me in about 6 months  We will set up an MRI to check on your head / make sure no issues with the blood vessels  Please let me know if anything is changing or getting worse Please continue to keep an eye on your BP at home- let me know if going significantly higher

## 2021-08-17 NOTE — Progress Notes (Unsigned)
[place] at [facility] [address], [place], [place] [address] [phone]  Date:  08/24/2021   Name:  Anne [name]   DOB:  01-Apr-1940   MRN:  [id number]  PCP:  Darreld Mclean, MD    Chief Complaint: No chief complaint on file.   History of Present Illness:  [name] is a 81 y.o. very pleasant female patient who presents with the following:  Patient seen today for periodic follow-up Most recent visit with myself was in January History of controlled diabetes, hyperlipidemia, hypertension, hypothyroidism, diverticulitis in May of last year Thyroid cancer status post thyroidectomy in 2011 Married to [place]  She had her cataracts done recently  She had a fall in [place] April- (she tripped tying to get away from a spider) she was seen by ortho and all is ok  Her brother recently died after a brief bout with liver cancer, her BIL just had a brain aneurysm but he had a successful operation   She will note a strange feeling in her head that comes and goes, has noted it for 2-3 months May occur about every 2-3 weeks She notes it feels like there is something on her scalp, like a bug or something touching her.  However when she feels there is nothing there.  It can also be painful.  Pressing on the area seems to help make it go away  Foot exam can be updated Update A1c Mammogram up-to-date Bone density up-to-date Cologuard completed 2 years ago, negative  Labs done in January-today needs BMP, lipids, A1c, TSH  They do check her BP at home - readings running 118- 144/ 56- 77  Aspirin 81 Lipitor HCTZ 12.5 Losartan/HCTZ 50/12.5 Metformin 500 daily Levothyroxine 112 Patient Active Problem List   Diagnosis Date Noted   Diverticulitis 08/02/2020   Controlled type 2 diabetes mellitus without complication, without long-term current use of insulin ([place]) 03/31/2015   Chest pressure 03/03/2013   Globus sensation 03/03/2013    DOE (dyspnea on exertion) 03/03/2013   Thyroid cancer ([place]) 01/05/2012   Hyperlipidemia 01/05/2012   Dysphonia 01/05/2012   Osteoarthritis, hip, 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   Hypertension 01/04/2012   Hypothyroid 01/04/2012    Past Medical History:  Diagnosis Date   Dental crowns status    01-14-2020 per pt had upper left front tooth abscess and has temporary crown from 2 wks ago,  pt stated abscess resolved and crown is not loose   Dyspnea    per pt gets sob at times with exertion   GERD (gastroesophageal reflux disease)    History of recent fall    per pt fell 01-08-2020 has bruised hip/ buttock / back area's and hit head ,  pt stated see provider, denies any dizziness, headache, or balance issues other than before   Hypertension    followed by pcp  (nulcear study in epic 03-06-2013 normwl perfusion w/ no ischemia, nuclear ef 74%)   Hypothyroidism, 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   followed by pcp---  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)   OA (osteoarthritis)    PMB (postmenopausal bleeding)    Thickened endometrium    Type 2 diabetes mellitus ([place])    followed by pcp   Wears glasses     Past Surgical History:  Procedure Laterality Date   CATARACT EXTRACTION W/ INTRAOCULAR LENS  IMPLANT, BILATERAL  2008   THYROIDECTOMY  12/25/2009   TONSILLECTOMY AND ADENOIDECTOMY  age 30  TOTAL HIP ARTHROPLASTY Bilateral left 10/07/2007;  right 06-03-2008  '@[facility]     Social History   Tobacco Use   Smoking status: Never   Smokeless tobacco: Never  Vaping Use   Vaping Use: Never used  Substance Use Topics   Alcohol use: No   Drug use: Never    Family History  Problem Relation Age of Onset   Stroke Mother    Cancer Father    Cancer Sister    Cancer Sister    Cancer Sister    Miscarriages / Stillbirths Neg Hx    Breast cancer Neg Hx     Allergies  Allergen Reactions   Amoxicillin Itching    vaginal itching and bleeding (side effect)    Medication list has been reviewed  and updated.  Current Outpatient Medications on File Prior to Visit  Medication Sig Dispense Refill   acetaminophen (TYLENOL) 500 MG tablet Take 500 mg by mouth every 6 (six) hours as needed for mild pain.     aspirin 81 MG tablet Take 81 mg by mouth at bedtime.      atorvastatin (LIPITOR) 20 MG tablet TAKE 1 TABLET(20 MG) BY MOUTH DAILY 90 tablet 3   b complex vitamins tablet Take 1 tablet by mouth daily.     Calcium-Phosphorus-Vitamin D (CITRACAL +D3 PO) Take 2 tablets by mouth daily.      Cholecalciferol (VITAMIN D-3) 5000 UNITS TABS Take 1 capsule by mouth daily.      famotidine (PEPCID) 20 MG tablet TAKE 1/2 TABLET(10 MG) BY MOUTH DAILY (Patient taking differently: Take 10 mg by mouth daily as needed.) 45 tablet 3   Glycerin-Hypromellose-PEG 400 ([employer] DRY EYE RELIEF OP) Apply 2 drops to eye as needed.      levothyroxine (SYNTHROID) 112 MCG tablet TAKE 1 TABLET(112 MCG) BY MOUTH DAILY 90 tablet 3   losartan-hydrochlorothiazide (HYZAAR) 50-12.5 MG tablet TAKE 1 TABLET BY MOUTH DAILY 90 tablet 3   metFORMIN (GLUCOPHAGE) 500 MG tablet TAKE 1 TABLET BY MOUTH DAILY 90 tablet 3   oxyCODONE-acetaminophen (PERCOCET/ROXICET) 5-325 MG tablet Take 0.5 tablets by mouth every 8 (eight) hours as needed for severe pain. 3 tablet 0   trolamine salicylate (ASPERCREME) 10 % cream Apply 1 application topically as needed for muscle pain.     No current facility-administered medications on file prior to visit.    Review of Systems:  As per HPI- otherwise negative.   Physical Examination: Vitals:   08/24/21 0944 08/24/21 1007  BP: (!) 182/86 (!) 160/95  Pulse: 87    Vitals:   08/24/21 0944  Weight: 222 lb 6.4 oz (100.9 kg)  Height: '5\' 5"'$  (1.651 m)   Body mass index is 37.01 kg/m. Ideal Body Weight: Weight in (lb) to have BMI = 25: 149.9  GEN: no acute distress. Obese, looks well  HEENT: Atraumatic, Normocephalic. Bilateral TM wnl, oropharynx normal.  PEERL,EOMI.   Ears and Nose: No external  deformity. CV: RRR, No M/G/R. No JVD. No thrill. No extra heart sounds. PULM: CTA B, no wheezes, crackles, rhonchi. No retractions. No resp. distress. No accessory muscle use. ABD: S, NT, ND, +BS. No rebound. No HSM. EXTR: No c/c/e PSYCH: Normally interactive. Conversant.  Normal strength, sensation and DTR of all extremities  I am not able to reproduce discomfort on scalp, no masses or tenderness noted.  Do not suspect temporal arteritis as patient has noted the sensation in several different areas  Assessment and Plan: Controlled type 2 diabetes mellitus without complication, without  long-term current use of insulin ([place]) - Plan: Basic metabolic panel, Hemoglobin A1c  Postoperative hypothyroidism - Plan: TSH  Essential hypertension - Plan: hydrochlorothiazide (MICROZIDE) 12.5 MG capsule  Mixed hyperlipidemia - Plan: Lipid panel  Chronic nonintractable headache, unspecified headache type  Patient seen today for follow-up.  Await lab work to follow-up on diabetes.  Blood pressure under good control on current medication Thyroid recheck pending today Blood pressure elevated in clinic today.  They are checking her blood pressure regularly at home and all of her readings are normal.  We checked her blood pressure cuff today for accuracy Patient has noted an unusual pain in her head-a feeling that there is something present on her scalp, pressing on the area seems to resolve it. Would like to get an MRI brain to rule out aneurysm  Signed Lamar Blinks, MD  Called and left detailed message.  I am unable to sign order for MRI brain, Medicare does not cover this test apparently.  I would suggest we start with a CT head, this would help [place] rule out major abnormalities and if anything looks abnormal would assist in getting the MRI covered.  I will order a CT noncontrast to be done here at the med center ASAP

## 2021-08-24 ENCOUNTER — Encounter: Payer: Self-pay | Admitting: Family Medicine

## 2021-08-24 ENCOUNTER — Ambulatory Visit (INDEPENDENT_AMBULATORY_CARE_PROVIDER_SITE_OTHER): Payer: Medicare Other | Admitting: Family Medicine

## 2021-08-24 VITALS — BP 160/95 | HR 87 | Ht 65.0 in | Wt 222.4 lb

## 2021-08-24 DIAGNOSIS — E890 Postprocedural hypothyroidism: Secondary | ICD-10-CM

## 2021-08-24 DIAGNOSIS — I10 Essential (primary) hypertension: Secondary | ICD-10-CM | POA: Diagnosis not present

## 2021-08-24 DIAGNOSIS — E119 Type 2 diabetes mellitus without complications: Secondary | ICD-10-CM

## 2021-08-24 DIAGNOSIS — R519 Headache, unspecified: Secondary | ICD-10-CM | POA: Diagnosis not present

## 2021-08-24 DIAGNOSIS — G8929 Other chronic pain: Secondary | ICD-10-CM | POA: Diagnosis not present

## 2021-08-24 DIAGNOSIS — E782 Mixed hyperlipidemia: Secondary | ICD-10-CM | POA: Diagnosis not present

## 2021-08-24 LAB — LIPID PANEL
Cholesterol: 158 mg/dL (ref 0–200)
HDL: 51.8 mg/dL (ref >39.00)
LDL Cholesterol: 77 mg/dL (ref 0–99)
NonHDL: 106.18
Total CHOL/HDL Ratio: 3
Triglycerides: 148 mg/dL (ref 0.0–149.0)
VLDL: 29.6 mg/dL (ref 0.0–40.0)

## 2021-08-24 LAB — BASIC METABOLIC PANEL
BUN: 14 mg/dL (ref 6–23)
CO2: 27 mEq/L (ref 19–32)
Calcium: 9.8 mg/dL (ref 8.4–10.5)
Chloride: 100 mEq/L (ref 96–112)
Creatinine, Ser: 0.77 mg/dL (ref 0.40–1.20)
GFR: 72.35 mL/min (ref >60.00)
Glucose, Bld: 159 mg/dL — ABNORMAL HIGH (ref 70–99)
Potassium: 3.8 mEq/L (ref 3.5–5.1)
Sodium: 138 mEq/L (ref 135–145)

## 2021-08-24 LAB — HEMOGLOBIN A1C: Hgb A1c MFr Bld: 8 % — ABNORMAL HIGH (ref 4.6–6.5)

## 2021-08-24 LAB — TSH: TSH: 0.83 u[IU]/mL (ref 0.35–5.50)

## 2021-08-24 MED ORDER — HYDROCHLOROTHIAZIDE 12.5 MG PO CAPS: 12.5000 mg | ORAL_CAPSULE | ORAL | 3 refills | Status: DC | PRN

## 2021-08-31 ENCOUNTER — Ambulatory Visit (HOSPITAL_BASED_OUTPATIENT_CLINIC_OR_DEPARTMENT_OTHER)
Admission: RE | Admit: 2021-08-31 | Discharge: 2021-08-31 | Disposition: A | Discharge status: Home / Self Care | Payer: Medicare Other | Source: Ambulatory Visit | Attending: Family Medicine | Admitting: Family Medicine

## 2021-08-31 ENCOUNTER — Encounter: Payer: Self-pay | Admitting: Family Medicine

## 2021-08-31 DIAGNOSIS — R519 Headache, unspecified: Secondary | ICD-10-CM | POA: Insufficient documentation

## 2021-08-31 DIAGNOSIS — G8929 Other chronic pain: Secondary | ICD-10-CM | POA: Diagnosis not present

## 2021-09-08 ENCOUNTER — Other Ambulatory Visit: Payer: Self-pay | Admitting: Family Medicine

## 2021-09-14 ENCOUNTER — Telehealth: Payer: Self-pay | Admitting: Family Medicine

## 2021-09-14 DIAGNOSIS — E119 Type 2 diabetes mellitus without complications: Secondary | ICD-10-CM

## 2021-09-14 NOTE — Telephone Encounter (Signed)
Patient called to follow up on a letter that she received from our office regarding updating a medication. She is okay with updating the medication but would like a call to discuss. Also she wanted to go over the CT Scan. Please call patient to advise.

## 2021-09-15 ENCOUNTER — Other Ambulatory Visit: Payer: Self-pay | Admitting: Family Medicine

## 2021-09-15 MED ORDER — METFORMIN HCL 500 MG PO TABS: 500.0000 mg | ORAL_TABLET | Freq: Two times a day (BID) | ORAL | 3 refills | Status: DC

## 2021-09-15 NOTE — Telephone Encounter (Signed)
I called her but had to [facility] Will increase metformin dose CT is ok- we can proceed to MRI if you like  I will deactivate her mychart account as she is not reading it  Called her cell phone and did reach her- see above info She declines MRI at this time but will let me know if any sx persist She is ok with my turning off her mychart

## 2021-09-15 NOTE — Telephone Encounter (Signed)
Pt following up on doubling Metformin and  the message that reads: "If you like, we can try to get an MRI done now for further detail- or I am also glad to refer you to neurology for their opinion if you like"   Please advise:

## 2021-12-06 ENCOUNTER — Other Ambulatory Visit: Payer: Self-pay | Admitting: Family Medicine

## 2021-12-06 DIAGNOSIS — Z1231 Encounter for screening mammogram for malignant neoplasm of breast: Secondary | ICD-10-CM

## 2022-01-31 ENCOUNTER — Ambulatory Visit
Admission: RE | Admit: 2022-01-31 | Discharge: 2022-01-31 | Disposition: A | Discharge status: Home / Self Care | Payer: Medicare Other | Source: Ambulatory Visit | Attending: Family Medicine | Admitting: Family Medicine

## 2022-01-31 DIAGNOSIS — Z1231 Encounter for screening mammogram for malignant neoplasm of breast: Secondary | ICD-10-CM

## 2022-02-10 DIAGNOSIS — Z23 Encounter for immunization: Secondary | ICD-10-CM | POA: Diagnosis not present

## 2022-02-26 NOTE — Patient Instructions (Incomplete)
It was great to see you again today, I will be in touch with your labs soon as possible Recommend the latest COVID booster, also consider dose of RSV if not done already Assuming all is well lets check back in 6 months

## 2022-02-26 NOTE — Progress Notes (Addendum)
[place] at [facility] [address], [place], [place] [address] [phone]  Date:  02/27/2022   Name:  Anne [name]   DOB:  [birth year]   MRN:  [id number]  PCP:  Darreld Mclean, MD    Chief Complaint: 6 month follow up (Concerns/ questions: 1. Pt says she eats when taking either her Lipitor or DM med, after that she gets a bitter taste in her mouth. 2.L hip pain x 1 week, right hip was aching some as well. /Flu shot today: 02/10/22/)   History of Present Illness:  Anne [name] is a 82 y.o. very pleasant female patient who presents with the following:  Patient seen today for periodic follow-up History of controlled diabetes, hyperlipidemia, hypertension, hypothyroidism, diverticulitis in May of last year Most recent visit with myself was in July-at that time she was doing well except for an unusual feeling in her scalp.  A CT of her head was negative at that time  Thyroid cancer status post thyroidectomy in 2011 Married to [place]  Aspirin 81 Lipitor 20 HCTZ 12.5 Levothyroxine 112 losartan/HCTZ Metformin 500 twice daily Blood work completed in July-BMP, lipid, TSH, A1c 8%  Lipids looked good at that time, ratio 3 Can offer CT coronary calcium She did do a stress test in 2015, negative Mammogram completed last month DEXA scan 1 year ago, normal Flu shot- done   She notes her brother and BIL died since our last visit   She notes her home BP running perhaps 120-140/60-70  Pt notes she tends to feel more tired and "sleepy" like she could go back to sleep after getting up in the am for 3-4 months. She describes feeling unmotivated to do things  No CP. She notes her DOE is stable- she has had this for years   Wt Readings from Last 3 Encounters:  02/27/22 228 lb 6.4 oz (103.6 kg)  08/24/21 222 lb 6.4 oz (100.9 kg)  02/23/21 221 lb 12.8 oz (100.6 kg)   We had thought her weight was down, but a repeat showed this is  not the case     Patient Active Problem List   Diagnosis Date Noted   Diverticulitis 08/02/2020   Controlled type 2 diabetes mellitus without complication, without long-term current use of insulin ([place]) 03/31/2015   Chest pressure 03/03/2013   Globus sensation 03/03/2013   DOE (dyspnea on exertion) 03/03/2013   Thyroid cancer ([place]) 01/05/2012   Hyperlipidemia 01/05/2012   Dysphonia 01/05/2012   Osteoarthritis, hip, 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   Hypertension 01/04/2012   Hypothyroid 01/04/2012    Past Medical History:  Diagnosis Date   Dental crowns status    01-14-2020 per pt had upper left front tooth abscess and has temporary crown from 2 wks ago,  pt stated abscess resolved and crown is not loose   Dyspnea    per pt gets sob at times with exertion   GERD (gastroesophageal reflux disease)    History of recent fall    per pt fell 01-08-2020 has bruised hip/ buttock / back area's and hit head ,  pt stated see provider, denies any dizziness, headache, or balance issues other than before   Hypertension    followed by pcp  (nulcear study in epic 03-06-2013 normwl perfusion w/ no ischemia, nuclear ef 74%)   Hypothyroidism, 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   followed by pcp---  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)   OA (osteoarthritis)  PMB (postmenopausal bleeding)    Thickened endometrium    Type 2 diabetes mellitus ([place])    followed by pcp   Wears glasses     Past Surgical History:  Procedure Laterality Date   CATARACT EXTRACTION W/ INTRAOCULAR LENS  IMPLANT, BILATERAL  2008   THYROIDECTOMY  12/25/2009   TONSILLECTOMY AND ADENOIDECTOMY  age 45   TOTAL HIP ARTHROPLASTY Bilateral left 10/07/2007;  right 06-03-2008  '@[facility]'$     Social History   Tobacco Use   Smoking status: Never   Smokeless tobacco: Never  Vaping Use   Vaping Use: Never used  Substance Use Topics   Alcohol use: No   Drug use: Never    Family History  Problem Relation Age of Onset   Stroke Mother     Cancer Father    Cancer Sister    Cancer Sister    Cancer Sister    Miscarriages / Stillbirths Neg Hx    Breast cancer Neg Hx     Allergies  Allergen Reactions   Amoxicillin Itching    vaginal itching and bleeding (side effect)    Medication list has been reviewed and updated.  Current Outpatient Medications on File Prior to Visit  Medication Sig Dispense Refill   acetaminophen (TYLENOL) 500 MG tablet Take 500 mg by mouth every 6 (six) hours as needed for mild pain.     aspirin 81 MG tablet Take 81 mg by mouth at bedtime.      atorvastatin (LIPITOR) 20 MG tablet TAKE 1 TABLET(20 MG) BY MOUTH DAILY 90 tablet 3   b complex vitamins tablet Take 1 tablet by mouth daily.     Calcium-Phosphorus-Vitamin D (CITRACAL +D3 PO) Take 2 tablets by mouth daily.      Cholecalciferol (VITAMIN D-3) 5000 UNITS TABS Take 1 capsule by mouth daily.      famotidine (PEPCID) 20 MG tablet TAKE 1/2 TABLET(10 MG) BY MOUTH DAILY (Patient taking differently: Take 10 mg by mouth daily as needed.) 45 tablet 3   Glycerin-Hypromellose-PEG 400 ([employer] DRY EYE RELIEF OP) Apply 2 drops to eye as needed.      hydrochlorothiazide (MICROZIDE) 12.5 MG capsule Take 1 capsule (12.5 mg total) by mouth as needed. Take with losartan/hctz to equal 25 of hctz total 90 capsule 3   levothyroxine (SYNTHROID) 112 MCG tablet TAKE 1 TABLET(112 MCG) BY MOUTH DAILY 90 tablet 3   losartan-hydrochlorothiazide (HYZAAR) 50-12.5 MG tablet TAKE 1 TABLET BY MOUTH DAILY 90 tablet 3   metFORMIN (GLUCOPHAGE) 500 MG tablet Take 1 tablet (500 mg total) by mouth 2 (two) times daily with a meal. 180 tablet 3   oxyCODONE-acetaminophen (PERCOCET/ROXICET) 5-325 MG tablet Take 0.5 tablets by mouth every 8 (eight) hours as needed for severe pain. 3 tablet 0   trolamine salicylate (ASPERCREME) 10 % cream Apply 1 application topically as needed for muscle pain.     No current facility-administered medications on file prior to visit.    Review of  Systems:  As per HPI- otherwise negative.   Physical Examination: Vitals:   02/27/22 0946  BP: (!) 150/80  Pulse: 80  Resp: 19  Temp: 97.8 F (36.6 C)  SpO2: 98%   Vitals:   02/27/22 0946  Weight: 228 lb 6.4 oz (103.6 kg)  Height: '5\' 5"'$  (1.651 m)   Body mass index is 38.01 kg/m. Ideal Body Weight: Weight in (lb) to have BMI = 25: 149.9  GEN: no acute distress. Obese, looks well  HEENT: Atraumatic, Normocephalic.  Ears  and Nose: No external deformity. CV: RRR, No M/G/R. No JVD. No thrill. No extra heart sounds. PULM: CTA B, no wheezes, crackles, rhonchi. No retractions. No resp. distress. No accessory muscle use. ABD: S, NT, ND, +BS. No rebound. No HSM. EXTR: No c/c/e PSYCH: Normally interactive. Conversant.    Assessment and Plan: Controlled type 2 diabetes mellitus without complication, without long-term current use of insulin ([place]) - Plan: Comprehensive metabolic panel, Hemoglobin A1c, Microalbumin / creatinine urine ratio, CT CARDIAC SCORING (SELF PAY ONLY)  Postoperative hypothyroidism - Plan: TSH  Essential hypertension - Plan: CBC, CT CARDIAC SCORING (SELF PAY ONLY)  Mixed hyperlipidemia - Plan: CT CARDIAC SCORING (SELF PAY ONLY)  Fatigue, unspecified type - Plan: VITAMIN D 25 Hydroxy (Vit-D Deficiency, Fractures)  Vitamin D deficiency - Plan: VITAMIN D 25 Hydroxy (Vit-D Deficiency, Fractures)  Pt seen today with concern of feeling more tired and sleepy Labs pending as above   It was great to see you again today, I will be in touch with your labs soon as possible and we will look for any explanation for your fatigue.  If all is normal we might consider -sleep evaluation -medication for depression?  Recommend the latest COVID booster, also consider dose of RSV if not done already  Please stop by imaging on the way out today and schedule your CT coronary/ heart artery test   Signed Lamar Blinks, MD  Received labs as below-patient does not have  MyChart, will need to call her Addendum 1/17, called patient to go over lab work She notes that she is taking her thyroid medication faithfully.  As such, we will increase her from 112 to 225 mcg, plan to recheck TSH in 4 to 6 weeks as a lab visit only  Advised blood sugars are a bit too high, will have her take Jardiance 10 mg.  Sent to her pharmacy.  Plan to follow-up in 4 to 6 months for diabetes recheck Results for orders placed or performed in visit on 02/27/22  CBC  Result Value Ref Range   WBC 4.9 4.0 - 10.5 K/uL   RBC 4.07 3.87 - 5.11 Mil/uL   Platelets 227.0 150.0 - 400.0 K/uL   Hemoglobin 13.5 12.0 - 15.0 g/dL   HCT 39.1 36.0 - 46.0 %   MCV 96.2 78.0 - 100.0 fl   MCHC 34.5 30.0 - 36.0 g/dL   RDW 14.6 11.5 - 15.5 %  Comprehensive metabolic panel  Result Value Ref Range   Sodium 138 135 - 145 mEq/L   Potassium 4.0 3.5 - 5.1 mEq/L   Chloride 99 96 - 112 mEq/L   CO2 27 19 - 32 mEq/L   Glucose, Bld 206 (H) 70 - 99 mg/dL   BUN 12 6 - 23 mg/dL   Creatinine, Ser 0.82 0.40 - 1.20 mg/dL   Total Bilirubin 0.6 0.2 - 1.2 mg/dL   Alkaline Phosphatase 83 39 - 117 U/L   AST 24 0 - 37 U/L   ALT 25 0 - 35 U/L   Total Protein 7.7 6.0 - 8.3 g/dL   Albumin 4.5 3.5 - 5.2 g/dL   GFR 66.85 >60.00 mL/min   Calcium 10.2 8.4 - 10.5 mg/dL  Hemoglobin A1c  Result Value Ref Range   Hgb A1c MFr Bld 8.6 (H) 4.6 - 6.5 %  TSH  Result Value Ref Range   TSH 5.79 (H) 0.35 - 5.50 uIU/mL  Microalbumin / creatinine urine ratio  Result Value Ref Range   Microalb, Ur 1.6  0.0 - 1.9 mg/dL   Creatinine,U 116.9 mg/dL   Microalb Creat Ratio 1.4 0.0 - 30.0 mg/g  VITAMIN D 25 Hydroxy (Vit-D Deficiency, Fractures)  Result Value Ref Range   VITD 30.81 30.00 - 100.00 ng/mL

## 2022-02-27 ENCOUNTER — Encounter: Payer: Self-pay | Admitting: Family Medicine

## 2022-02-27 ENCOUNTER — Ambulatory Visit (INDEPENDENT_AMBULATORY_CARE_PROVIDER_SITE_OTHER): Payer: Medicare Other | Admitting: Family Medicine

## 2022-02-27 VITALS — BP 132/82 | HR 80 | Temp 97.8°F | Resp 19 | Ht 65.0 in | Wt 228.4 lb

## 2022-02-27 DIAGNOSIS — E890 Postprocedural hypothyroidism: Secondary | ICD-10-CM | POA: Diagnosis not present

## 2022-02-27 DIAGNOSIS — E782 Mixed hyperlipidemia: Secondary | ICD-10-CM | POA: Diagnosis not present

## 2022-02-27 DIAGNOSIS — R5383 Other fatigue: Secondary | ICD-10-CM

## 2022-02-27 DIAGNOSIS — E559 Vitamin D deficiency, unspecified: Secondary | ICD-10-CM

## 2022-02-27 DIAGNOSIS — E119 Type 2 diabetes mellitus without complications: Secondary | ICD-10-CM | POA: Diagnosis not present

## 2022-02-27 DIAGNOSIS — I10 Essential (primary) hypertension: Secondary | ICD-10-CM | POA: Diagnosis not present

## 2022-02-27 LAB — COMPREHENSIVE METABOLIC PANEL
ALT: 25 U/L (ref 0–35)
AST: 24 U/L (ref 0–37)
Albumin: 4.5 g/dL (ref 3.5–5.2)
Alkaline Phosphatase: 83 U/L (ref 39–117)
BUN: 12 mg/dL (ref 6–23)
CO2: 27 mEq/L (ref 19–32)
Calcium: 10.2 mg/dL (ref 8.4–10.5)
Chloride: 99 mEq/L (ref 96–112)
Creatinine, Ser: 0.82 mg/dL (ref 0.40–1.20)
GFR: 66.85 mL/min (ref >60.00)
Glucose, Bld: 206 mg/dL — ABNORMAL HIGH (ref 70–99)
Potassium: 4 mEq/L (ref 3.5–5.1)
Sodium: 138 mEq/L (ref 135–145)
Total Bilirubin: 0.6 mg/dL (ref 0.2–1.2)
Total Protein: 7.7 g/dL (ref 6.0–8.3)

## 2022-02-27 LAB — CBC
HCT: 39.1 % (ref 36.0–46.0)
Hemoglobin: 13.5 g/dL (ref 12.0–15.0)
MCHC: 34.5 g/dL (ref 30.0–36.0)
MCV: 96.2 fl (ref 78.0–100.0)
Platelets: 227 10*3/uL (ref 150.0–400.0)
RBC: 4.07 Mil/uL (ref 3.87–5.11)
RDW: 14.6 % (ref 11.5–15.5)
WBC: 4.9 10*3/uL (ref 4.0–10.5)

## 2022-02-27 LAB — MICROALBUMIN / CREATININE URINE RATIO
Creatinine,U: 116.9 mg/dL
Microalb Creat Ratio: 1.4 mg/g (ref 0.0–30.0)
Microalb, Ur: 1.6 mg/dL (ref 0.0–1.9)

## 2022-02-27 LAB — HEMOGLOBIN A1C: Hgb A1c MFr Bld: 8.6 % — ABNORMAL HIGH (ref 4.6–6.5)

## 2022-02-27 LAB — VITAMIN D 25 HYDROXY (VIT D DEFICIENCY, FRACTURES): VITD: 30.81 ng/mL (ref 30.00–100.00)

## 2022-02-27 LAB — TSH: TSH: 5.79 u[IU]/mL — ABNORMAL HIGH (ref 0.35–5.50)

## 2022-03-01 MED ORDER — EMPAGLIFLOZIN 10 MG PO TABS: 10.0000 mg | ORAL_TABLET | Freq: Every day | ORAL | 3 refills | Status: DC

## 2022-03-01 MED ORDER — LEVOTHYROXINE SODIUM 125 MCG PO TABS: 125.0000 ug | ORAL_TABLET | Freq: Every day | ORAL | 3 refills | Status: AC

## 2022-03-01 NOTE — Addendum Note (Signed)
Addended by: Lamar Blinks C on: 03/01/2022 12:38 PM   Modules accepted: Orders

## 2022-03-02 ENCOUNTER — Telehealth: Payer: Self-pay | Admitting: Family Medicine

## 2022-03-02 NOTE — Telephone Encounter (Signed)
Spoke with patient she req CB she was driving.

## 2022-03-06 ENCOUNTER — Ambulatory Visit (HOSPITAL_BASED_OUTPATIENT_CLINIC_OR_DEPARTMENT_OTHER)
Admission: RE | Admit: 2022-03-06 | Discharge: 2022-03-06 | Disposition: A | Discharge status: Home / Self Care | Payer: Medicare Other | Source: Ambulatory Visit | Attending: Family Medicine | Admitting: Family Medicine

## 2022-03-06 DIAGNOSIS — E119 Type 2 diabetes mellitus without complications: Secondary | ICD-10-CM | POA: Insufficient documentation

## 2022-03-06 DIAGNOSIS — I10 Essential (primary) hypertension: Secondary | ICD-10-CM | POA: Insufficient documentation

## 2022-03-06 DIAGNOSIS — E782 Mixed hyperlipidemia: Secondary | ICD-10-CM | POA: Insufficient documentation

## 2022-03-07 ENCOUNTER — Other Ambulatory Visit: Payer: Self-pay | Admitting: Family Medicine

## 2022-03-13 ENCOUNTER — Telehealth: Payer: Self-pay | Admitting: Family Medicine

## 2022-03-13 NOTE — Telephone Encounter (Signed)
Spoke with patient she stated she will call back to sched AWV in person.

## 2022-03-13 NOTE — Telephone Encounter (Signed)
Pt just wanted to know if she should take jardiance and metformin or just jardiance.

## 2022-03-14 ENCOUNTER — Other Ambulatory Visit (HOSPITAL_BASED_OUTPATIENT_CLINIC_OR_DEPARTMENT_OTHER): Payer: Medicare Other

## 2022-03-14 NOTE — Telephone Encounter (Signed)
Called her back- she should take both

## 2022-04-05 ENCOUNTER — Telehealth: Payer: Self-pay | Admitting: Family Medicine

## 2022-04-05 NOTE — Telephone Encounter (Signed)
Contacted Anne [name] to schedule their annual wellness visit. Patient declined to schedule AWV at this time.She was out of town req CB next week   [name]; [place] Direct Dial: [phone]

## 2022-04-18 DIAGNOSIS — M25551 Pain in right hip: Secondary | ICD-10-CM | POA: Diagnosis not present

## 2022-04-20 ENCOUNTER — Ambulatory Visit: Payer: Medicare Other

## 2022-04-27 DIAGNOSIS — M25551 Pain in right hip: Secondary | ICD-10-CM | POA: Diagnosis not present

## 2022-05-04 DIAGNOSIS — M1712 Unilateral primary osteoarthritis, left knee: Secondary | ICD-10-CM | POA: Insufficient documentation

## 2022-05-04 DIAGNOSIS — M25562 Pain in left knee: Secondary | ICD-10-CM | POA: Diagnosis not present

## 2022-05-26 NOTE — Patient Instructions (Incomplete)
It was great to see you again today!  Please stop by lab and then imaging on the ground floor to set up your thyroid US If your thyroid is still underactive that can make you feel tired  Try some tucks pads for you sore bottom  If the thyroid ultrasound is normal and you continue to have difficulty swallowing we can have you see a GI doctor to check on your esophagus

## 2022-05-26 NOTE — Progress Notes (Unsigned)
[facility] at [facility] [address], [address] [place], [place] [address] [phone]  Date:  05/29/2022   Name:  Anne [name]   DOB:  [birth year]   MRN:  [id number]  PCP:  Pearline Cables, MD    Chief Complaint: No chief complaint on file.   History of Present Illness:  Anne [name] is a 82 y.o. very pleasant female patient who presents with the following:  Patient seen today for periodic follow-up Most recent visit with myself was in January History of controlled diabetes, hyperlipidemia, hypertension, hypothyroidism, diverticulitis, thyroid cancer status post thyroidectomy in 2011  Married to [place], they have several children and grandchildren  At her last visit she was feeling fatigued and sleepy.  Her TSH was elevated so I increased her thyroid replacement from 112 to 225 We also added Jardiance 10 mg for elevated A1c  Need to recheck TSH and A1c today  Eye exam  Lab Results  Component Value Date   HGBA1C 8.6 (H) 02/27/2022   Lipitor 20 mg Aspirin 81 Jardiance 10 HCTZ 12.5/losartan/HCTZ 50/12.5 Metformin 500 Patient Active Problem List   Diagnosis Date Noted   Diverticulitis 08/02/2020   Controlled type 2 diabetes mellitus without complication, without long-term current use of insulin 03/31/2015   Chest pressure 03/03/2013   Globus sensation 03/03/2013   DOE (dyspnea on exertion) 03/03/2013   Thyroid cancer 01/05/2012   Hyperlipidemia 01/05/2012   Dysphonia 01/05/2012   Osteoarthritis, hip, 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   Hypertension 01/04/2012   Hypothyroid 01/04/2012    Past Medical History:  Diagnosis Date   Dental crowns status    01-14-2020 per pt had upper left front tooth abscess and has temporary crown from 2 wks ago,  pt stated abscess resolved and crown is not loose   Dyspnea    per pt gets sob at times with exertion   GERD (gastroesophageal reflux disease)    History of recent fall    per pt fell  01-08-2020 has bruised hip/ buttock / back area's and hit head ,  pt stated see provider, denies any dizziness, headache, or balance issues other than before   Hypertension    followed by pcp  (nulcear study in epic 03-06-2013 normwl perfusion w/ no ischemia, nuclear ef 74%)   Hypothyroidism, 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   followed by pcp---  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)   OA (osteoarthritis)    PMB (postmenopausal bleeding)    Thickened endometrium    Type 2 diabetes mellitus ([facility])    followed by pcp   Wears glasses     Past Surgical History:  Procedure Laterality Date   CATARACT EXTRACTION W/ INTRAOCULAR LENS  IMPLANT, BILATERAL  2008   THYROIDECTOMY  12/25/2009   TONSILLECTOMY AND ADENOIDECTOMY  age 75   TOTAL HIP ARTHROPLASTY Bilateral left 10/07/2007;  right 06-03-2008      Social History   Tobacco Use   Smoking status: Never   Smokeless tobacco: Never  Vaping Use   Vaping Use: Never used  Substance Use Topics   Alcohol use: No   Drug use: Never    Family History  Problem Relation Age of Onset   Stroke Mother    Cancer Father    Cancer Sister    Cancer Sister    Cancer Sister    Miscarriages / Stillbirths Neg Hx    Breast cancer Neg Hx     Allergies  Allergen Reactions  Amoxicillin Itching    vaginal itching and bleeding (side effect)    Medication list has been reviewed and updated.  Current Outpatient Medications on File Prior to Visit  Medication Sig Dispense Refill   acetaminophen (TYLENOL) 500 MG tablet Take 500 mg by mouth every 6 (six) hours as needed for mild pain.     aspirin 81 MG tablet Take 81 mg by mouth at bedtime.      atorvastatin (LIPITOR) 20 MG tablet TAKE 1 TABLET(20 MG) BY MOUTH DAILY 90 tablet 3   b complex vitamins tablet Take 1 tablet by mouth daily.     Calcium-Phosphorus-Vitamin D (CITRACAL +D3 PO) Take 2 tablets by mouth daily.      Cholecalciferol (VITAMIN D-3) 5000 UNITS TABS Take 1 capsule by mouth daily.       empagliflozin (JARDIANCE) 10 MG TABS tablet Take 1 tablet (10 mg total) by mouth daily before breakfast. 90 tablet 3   famotidine (PEPCID) 20 MG tablet TAKE 1/2 TABLET(10 MG) BY MOUTH DAILY (Patient taking differently: Take 10 mg by mouth daily as needed.) 45 tablet 3   Glycerin-Hypromellose-PEG 400 ([employer] DRY EYE RELIEF OP) Apply 2 drops to eye as needed.      hydrochlorothiazide (MICROZIDE) 12.5 MG capsule Take 1 capsule (12.5 mg total) by mouth as needed. Take with losartan/hctz to equal 25 of hctz total 90 capsule 3   levothyroxine (SYNTHROID) 125 MCG tablet Take 1 tablet (125 mcg total) by mouth daily before breakfast. 90 tablet 3   losartan-hydrochlorothiazide (HYZAAR) 50-12.5 MG tablet TAKE 1 TABLET BY MOUTH DAILY 90 tablet 3   metFORMIN (GLUCOPHAGE) 500 MG tablet Take 1 tablet (500 mg total) by mouth 2 (two) times daily with a meal. 180 tablet 3   oxyCODONE-acetaminophen (PERCOCET/ROXICET) 5-325 MG tablet Take 0.5 tablets by mouth every 8 (eight) hours as needed for severe pain. 3 tablet 0   trolamine salicylate (ASPERCREME) 10 % cream Apply 1 application topically as needed for muscle pain.     No current facility-administered medications on file prior to visit.    Review of Systems:  As per HPI- otherwise negative.   Physical Examination: There were no vitals filed for this visit. There were no vitals filed for this visit. There is no height or weight on file to calculate BMI. Ideal Body Weight:    GEN: no acute distress. HEENT: Atraumatic, Normocephalic.  Ears and Nose: No external deformity. CV: RRR, No M/G/R. No JVD. No thrill. No extra heart sounds. PULM: CTA B, no wheezes, crackles, rhonchi. No retractions. No resp. distress. No accessory muscle use. ABD: S, NT, ND, +BS. No rebound. No HSM. EXTR: No c/c/e PSYCH: Normally interactive. Conversant.    Assessment and Plan: ***  Signed Abbe Amsterdam, MD

## 2022-05-29 ENCOUNTER — Ambulatory Visit (INDEPENDENT_AMBULATORY_CARE_PROVIDER_SITE_OTHER): Payer: Medicare Other | Admitting: Family Medicine

## 2022-05-29 VITALS — BP 134/80 | HR 98 | Temp 97.9°F | Resp 18 | Ht 65.0 in | Wt 223.0 lb

## 2022-05-29 DIAGNOSIS — K6289 Other specified diseases of anus and rectum: Secondary | ICD-10-CM

## 2022-05-29 DIAGNOSIS — E782 Mixed hyperlipidemia: Secondary | ICD-10-CM | POA: Diagnosis not present

## 2022-05-29 DIAGNOSIS — I10 Essential (primary) hypertension: Secondary | ICD-10-CM | POA: Diagnosis not present

## 2022-05-29 DIAGNOSIS — E119 Type 2 diabetes mellitus without complications: Secondary | ICD-10-CM | POA: Diagnosis not present

## 2022-05-29 DIAGNOSIS — E890 Postprocedural hypothyroidism: Secondary | ICD-10-CM | POA: Diagnosis not present

## 2022-05-29 LAB — TSH: TSH: 2.93 u[IU]/mL (ref 0.35–5.50)

## 2022-05-29 LAB — HEMOGLOBIN A1C: Hgb A1c MFr Bld: 8.8 % — ABNORMAL HIGH (ref 4.6–6.5)

## 2022-06-01 ENCOUNTER — Ambulatory Visit (HOSPITAL_BASED_OUTPATIENT_CLINIC_OR_DEPARTMENT_OTHER): Admission: RE | Admit: 2022-06-01 | Payer: Medicare Other | Source: Ambulatory Visit

## 2022-06-01 ENCOUNTER — Telehealth: Payer: Self-pay | Admitting: Family Medicine

## 2022-06-01 NOTE — Telephone Encounter (Signed)
Copied from CRM [id number]. Topic: Medicare AWV >> Jun 01, 2022 11:31 AM Payton Doughty wrote: Reason for CRM: Called patient to schedule Medicare Annual Wellness Visit (AWV). Left message for patient to call back and schedule Medicare Annual Wellness Visit (AWV).  Last date of AWV: 01/31/2021  Please schedule an appointment at any time with Donne Anon, CMA  .  If any questions, please contact me.  Thank you ,  Verlee Rossetti; Care Guide Ambulatory Clinical Support [place] [facility] Direct Dial: [phone]

## 2022-06-04 ENCOUNTER — Other Ambulatory Visit: Payer: Self-pay | Admitting: Family Medicine

## 2022-06-04 ENCOUNTER — Ambulatory Visit (HOSPITAL_BASED_OUTPATIENT_CLINIC_OR_DEPARTMENT_OTHER)
Admission: RE | Admit: 2022-06-04 | Discharge: 2022-06-04 | Disposition: A | Discharge status: Home / Self Care | Payer: Medicare Other | Source: Ambulatory Visit | Attending: Family Medicine | Admitting: Family Medicine

## 2022-06-04 DIAGNOSIS — E890 Postprocedural hypothyroidism: Secondary | ICD-10-CM | POA: Diagnosis not present

## 2022-06-06 DIAGNOSIS — H524 Presbyopia: Secondary | ICD-10-CM | POA: Diagnosis not present

## 2022-06-06 DIAGNOSIS — D23121 Other benign neoplasm of skin of left upper eyelid, including canthus: Secondary | ICD-10-CM | POA: Diagnosis not present

## 2022-06-06 DIAGNOSIS — Z961 Presence of intraocular lens: Secondary | ICD-10-CM | POA: Diagnosis not present

## 2022-06-06 DIAGNOSIS — H52203 Unspecified astigmatism, bilateral: Secondary | ICD-10-CM | POA: Diagnosis not present

## 2022-06-06 DIAGNOSIS — E119 Type 2 diabetes mellitus without complications: Secondary | ICD-10-CM | POA: Diagnosis not present

## 2022-06-06 DIAGNOSIS — H43813 Vitreous degeneration, bilateral: Secondary | ICD-10-CM | POA: Diagnosis not present

## 2022-06-06 LAB — HM DIABETES EYE EXAM

## 2022-06-07 ENCOUNTER — Telehealth: Payer: Self-pay | Admitting: Family Medicine

## 2022-06-07 DIAGNOSIS — E119 Type 2 diabetes mellitus without complications: Secondary | ICD-10-CM

## 2022-06-07 NOTE — Telephone Encounter (Signed)
Pt called asking if Dr. Patsy Lager could give her a call back to go over some details about her medication.

## 2022-06-08 MED ORDER — EMPAGLIFLOZIN 25 MG PO TABS
25.0000 mg | ORAL_TABLET | Freq: Every day | ORAL | 3 refills | Status: DC
Start: 2022-06-08 — End: 2022-11-02

## 2022-06-08 NOTE — Telephone Encounter (Signed)
Patient stated that she would like to start on the Jardiance .  She just picked up 30 of the .

## 2022-06-08 NOTE — Telephone Encounter (Signed)
Left message to call back to get more information.    Not sure if you want to try to call her.

## 2022-06-08 NOTE — Addendum Note (Signed)
Addended by: Abbe Amsterdam C on: 06/08/2022 03:22 PM   Modules accepted: Orders

## 2022-06-15 ENCOUNTER — Telehealth: Payer: Self-pay | Admitting: Family Medicine

## 2022-06-15 NOTE — Telephone Encounter (Signed)
Contacted Anne [name] to schedule their annual wellness visit. Appointment made for 06/28/2022.  Verlee Rossetti; Care Guide Ambulatory Clinical Support  l [facility] Direct Dial: [phone]

## 2022-06-28 ENCOUNTER — Ambulatory Visit (INDEPENDENT_AMBULATORY_CARE_PROVIDER_SITE_OTHER): Payer: Medicare Other | Admitting: *Deleted

## 2022-06-28 VITALS — BP 149/75 | HR 82 | Ht 65.0 in | Wt 220.0 lb

## 2022-06-28 DIAGNOSIS — Z0000 Encounter for general adult medical examination without abnormal findings: Secondary | ICD-10-CM

## 2022-06-28 NOTE — Patient Instructions (Signed)
Anne [name] , Thank you for taking time to come for your Medicare Wellness Visit. I appreciate your ongoing commitment to your health goals. Please review the following plan we discussed and let me know if I can assist you in the future.   These are the goals we discussed:  Goals      Patient Stated     Increase exercise & eat healthier        This is a list of the screening recommended for you and due dates:  Health Maintenance  Topic Date Due   COVID-19 Vaccine (4 - 2023-24 season) 10/14/2021   Eye exam for diabetics  04/08/2022   Complete foot exam   08/25/2022   Flu Shot  09/14/2022   Hemoglobin A1C  11/28/2022   Yearly kidney function blood test for diabetes  02/28/2023   Yearly kidney health urinalysis for diabetes  02/28/2023   Medicare Annual Wellness Visit  06/28/2023   DTaP/Tdap/Td vaccine (2 - Tdap) 01/23/2024   Pneumonia Vaccine  Completed   DEXA scan (bone density measurement)  Completed   Zoster (Shingles) Vaccine  Completed   HPV Vaccine  Aged Out     Next appointment: Follow up in one year for your annual wellness visit.   Preventive Care 72 Years and Older, Female Preventive care refers to lifestyle choices and visits with your health care provider that can promote health and wellness. What does preventive care include? A yearly physical exam. This is also called an annual well check. Dental exams once or twice a year. Routine eye exams. Ask your health care provider how often you should have your eyes checked. Personal lifestyle choices, including: Daily care of your teeth and gums. Regular physical activity. Eating a healthy diet. Avoiding tobacco and drug use. Limiting alcohol use. Practicing safe sex. Taking low-dose aspirin every day. Taking vitamin and mineral supplements as recommended by your health care provider. What happens during an annual well check? The services and screenings done by your health care provider during your annual well  check will depend on your age, overall health, lifestyle risk factors, and family history of disease. Counseling  Your health care provider may ask you questions about your: Alcohol use. Tobacco use. Drug use. Emotional well-being. Home and relationship well-being. Sexual activity. Eating habits. History of falls. Memory and ability to understand (cognition). Work and work Astronomer. Reproductive health. Screening  You may have the following tests or measurements: Height, weight, and BMI. Blood pressure. Lipid and cholesterol levels. These may be checked every 5 years, or more frequently if you are over 67 years old. Skin check. Lung cancer screening. You may have this screening every year starting at age 55 if you have a 30-pack-year history of smoking and currently smoke or have quit within the past 15 years. Fecal occult blood test (FOBT) of the stool. You may have this test every year starting at age 71. Flexible sigmoidoscopy or colonoscopy. You may have a sigmoidoscopy every 5 years or a colonoscopy every 10 years starting at age 46. Hepatitis C blood test. Hepatitis B blood test. Sexually transmitted disease (STD) testing. Diabetes screening. This is done by checking your blood sugar (glucose) after you have not eaten for a while (fasting). You may have this done every 1-3 years. Bone density scan. This is done to screen for osteoporosis. You may have this done starting at age 25. Mammogram. This may be done every 1-2 years. Talk to your health care provider about how often  you should have regular mammograms. Talk with your health care provider about your test results, treatment options, and if necessary, the need for more tests. Vaccines  Your health care provider may recommend certain vaccines, such as: Influenza vaccine. This is recommended every year. Tetanus, diphtheria, and acellular pertussis (Tdap, Td) vaccine. You may need a Td booster every 10 years. Zoster  vaccine. You may need this after age 63. Pneumococcal 13-valent conjugate (PCV13) vaccine. One dose is recommended after age 71. Pneumococcal polysaccharide (PPSV23) vaccine. One dose is recommended after age 74. Talk to your health care provider about which screenings and vaccines you need and how often you need them. This information is not intended to replace advice given to you by your health care provider. Make sure you discuss any questions you have with your health care provider. Document Released: 02/26/2015 Document Revised: 10/20/2015 Document Reviewed: 12/01/2014 Elsevier Interactive Patient Education  2017 [employer] Prevention in the Home Falls can cause injuries. They can happen to people of all ages. There are many things you can do to make your home safe and to help prevent falls. What can I do on the outside of my home? Regularly fix the edges of walkways and driveways and fix any cracks. Remove anything that might make you trip as you walk through a door, such as a raised step or threshold. Trim any bushes or trees on the path to your home. Use bright outdoor lighting. Clear any walking paths of anything that might make someone trip, such as rocks or tools. Regularly check to see if handrails are loose or broken. Make sure that both sides of any steps have handrails. Any raised decks and porches should have guardrails on the edges. Have any leaves, snow, or ice cleared regularly. Use sand or salt on walking paths during winter. Clean up any spills in your garage right away. This includes oil or grease spills. What can I do in the bathroom? Use night lights. Install grab bars by the toilet and in the tub and shower. Do not use towel bars as grab bars. Use non-skid mats or decals in the tub or shower. If you need to sit down in the shower, use a plastic, non-slip stool. Keep the floor dry. Clean up any water that spills on the floor as soon as it happens. Remove  soap buildup in the tub or shower regularly. Attach bath mats securely with double-sided non-slip rug tape. Do not have throw rugs and other things on the floor that can make you trip. What can I do in the bedroom? Use night lights. Make sure that you have a light by your bed that is easy to reach. Do not use any sheets or blankets that are too big for your bed. They should not hang down onto the floor. Have a firm chair that has side arms. You can use this for support while you get dressed. Do not have throw rugs and other things on the floor that can make you trip. What can I do in the kitchen? Clean up any spills right away. Avoid walking on wet floors. Keep items that you use a lot in easy-to-reach places. If you need to reach something above you, use a strong step stool that has a grab bar. Keep electrical cords out of the way. Do not use floor polish or wax that makes floors slippery. If you must use wax, use non-skid floor wax. Do not have throw rugs and other things on  the floor that can make you trip. What can I do with my stairs? Do not leave any items on the stairs. Make sure that there are handrails on both sides of the stairs and use them. Fix handrails that are broken or loose. Make sure that handrails are as long as the stairways. Check any carpeting to make sure that it is firmly attached to the stairs. Fix any carpet that is loose or worn. Avoid having throw rugs at the top or bottom of the stairs. If you do have throw rugs, attach them to the floor with carpet tape. Make sure that you have a light switch at the top of the stairs and the bottom of the stairs. If you do not have them, ask someone to add them for you. What else can I do to help prevent falls? Wear shoes that: Do not have high heels. Have rubber bottoms. Are comfortable and fit you well. Are closed at the toe. Do not wear sandals. If you use a stepladder: Make sure that it is fully opened. Do not climb a  closed stepladder. Make sure that both sides of the stepladder are locked into place. Ask someone to hold it for you, if possible. Clearly mark and make sure that you can see: Any grab bars or handrails. First and last steps. Where the edge of each step is. Use tools that help you move around (mobility aids) if they are needed. These include: Canes. Walkers. Scooters. Crutches. Turn on the lights when you go into a dark area. Replace any light bulbs as soon as they burn out. Set up your furniture so you have a clear path. Avoid moving your furniture around. If any of your floors are uneven, fix them. If there are any pets around you, be aware of where they are. Review your medicines with your doctor. Some medicines can make you feel dizzy. This can increase your chance of falling. Ask your doctor what other things that you can do to help prevent falls. This information is not intended to replace advice given to you by your health care provider. Make sure you discuss any questions you have with your health care provider. Document Released: 11/26/2008 Document Revised: 07/08/2015 Document Reviewed: 03/06/2014 Elsevier Interactive Patient Education  2017 [employer].

## 2022-06-28 NOTE — Progress Notes (Signed)
Subjective:   Anne [name] is a 82 y.o. female who presents for Medicare Annual (Subsequent) preventive examination.  Review of Systems     Cardiac Risk Factors include: advanced age (>44men, >11 women);diabetes mellitus;dyslipidemia;hypertension;obesity (BMI >30kg/m2)     Objective:    Today's Vitals   06/28/22 1500 06/28/22 1530  BP: (!) 156/74 (!) 149/75  Pulse: 98 82  Weight: 220 lb (99.8 kg)   Height: 5\' 5"  (1.651 m)    Body mass index is 36.61 kg/m.     06/28/2022    3:09 PM 01/31/2021    3:53 PM 07/16/2020   12:27 PM 09/21/2017    9:23 AM 02/15/2015    5:25 PM  Advanced Directives  Does Patient Have a Medical Advance Directive? No No No No No  Would patient like information on creating a medical advance directive? No - Patient declined No - Patient declined Yes (ED - Information included in AVS) No - Patient declined     Current Medications (verified) Outpatient Encounter Medications as of 06/28/2022  Medication Sig   acetaminophen (TYLENOL) 500 MG tablet Take 500 mg by mouth every 6 (six) hours as needed for mild pain.   aspirin 81 MG tablet Take 81 mg by mouth at bedtime.    atorvastatin (LIPITOR) 20 MG tablet TAKE 1 TABLET(20 MG) BY MOUTH DAILY   b complex vitamins tablet Take 1 tablet by mouth daily.   Calcium-Phosphorus-Vitamin D (CITRACAL +D3 PO) Take 2 tablets by mouth daily.    Cholecalciferol (VITAMIN D-3) 5000 UNITS TABS Take 1 capsule by mouth daily.    empagliflozin (JARDIANCE) 25 MG TABS tablet Take 1 tablet (25 mg total) by mouth daily before breakfast.   famotidine (PEPCID) 20 MG tablet TAKE 1/2 TABLET(10 MG) BY MOUTH DAILY (Patient taking differently: Take 10 mg by mouth daily as needed.)   Glycerin-Hypromellose-PEG 400 ([facility] DRY EYE RELIEF OP) Apply 2 drops to eye as needed.    hydrochlorothiazide (MICROZIDE) 12.5 MG capsule Take 1 capsule (12.5 mg total) by mouth as needed. Take with losartan/hctz to equal 25 of hctz total   levothyroxine (SYNTHROID)  125 MCG tablet Take 1 tablet (125 mcg total) by mouth daily before breakfast.   losartan-hydrochlorothiazide (HYZAAR) 50-12.5 MG tablet TAKE 1 TABLET BY MOUTH DAILY   metFORMIN (GLUCOPHAGE) 500 MG tablet Take 1 tablet (500 mg total) by mouth 2 (two) times daily with a meal.   methocarbamol (ROBAXIN) 500 MG tablet Take 1 tablet by mouth every 8 (eight) hours as needed.   trolamine salicylate (ASPERCREME) 10 % cream Apply 1 application topically as needed for muscle pain.   [DISCONTINUED] meloxicam (MOBIC) 15 MG tablet Take 1 tablet every day by oral route with meal(s).   [DISCONTINUED] oxyCODONE-acetaminophen (PERCOCET/ROXICET) 5-325 MG tablet Take 0.5 tablets by mouth every 8 (eight) hours as needed for severe pain.   No facility-administered encounter medications on file as of 06/28/2022.    Allergies (verified) Amoxicillin   History: Past Medical History:  Diagnosis Date   Dental crowns status    01-14-2020 per pt had upper left front tooth abscess and has temporary crown from 2 wks ago,  pt stated abscess resolved and crown is not loose   Dyspnea    per pt gets sob at times with exertion   GERD (gastroesophageal reflux disease)    History of recent fall    per pt fell 01-08-2020 has bruised hip/ buttock / back area's and hit head ,  pt stated see provider,  denies any dizziness, headache, or balance issues other than before   Hypertension    followed by pcp  (nulcear study in epic 03-06-2013 [facility] perfusion w/ no ischemia, nuclear ef 74%)   Hypothyroidism, 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   followed by pcp---  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)   OA (osteoarthritis)    PMB (postmenopausal bleeding)    Thickened endometrium    Type 2 diabetes mellitus (HCC)    followed by pcp   Wears glasses    Past Surgical History:  Procedure Laterality Date   CATARACT EXTRACTION W/ INTRAOCULAR LENS  IMPLANT, BILATERAL  2008   THYROIDECTOMY  12/25/2009   TONSILLECTOMY AND  ADENOIDECTOMY  age 80   TOTAL HIP ARTHROPLASTY Bilateral left 10/07/2007;  right 06-03-2008  @[facility]    Family History  Problem Relation Age of Onset   Stroke Mother    Cancer Father    Cancer Sister    Cancer Sister    Cancer Sister    Miscarriages / Stillbirths Neg Hx    Breast cancer Neg Hx    Social History   Socioeconomic History   Marital status: Married    Spouse name: Leonette Most   Number of children: 1   Years of education: 14   Highest education level: Not on file  Occupational History    Employer: RETIRED  Tobacco Use   Smoking status: Never   Smokeless tobacco: Never  Vaping Use   Vaping Use: Never used  Substance and Sexual Activity   Alcohol use: No   Drug use: Never   Sexual activity: Not on file  Other Topics Concern   Not on file  Social History Narrative   Not on file   Social Determinants of Health   Financial Resource Strain: Low Risk  (01/31/2021)   Overall Financial Resource Strain (CARDIA)    Difficulty of Paying Living Expenses: Not hard at all  Food Insecurity: No Food Insecurity (06/28/2022)   Hunger Vital Sign    Worried About Running Out of Food in the Last Year: Never true    Ran Out of Food in the Last Year: Never true  Transportation Needs: No Transportation Needs (06/28/2022)   PRAPARE - Administrator, Civil Service (Medical): No    Lack of Transportation (Non-Medical): No  Physical Activity: Insufficiently Active (01/31/2021)   Exercise Vital Sign    Days of Exercise per Week: 2 days    Minutes of Exercise per Session: 40 min  Stress: Not on file  Social Connections: Moderately Isolated (01/31/2021)   Social Connection and Isolation Panel [NHANES]    Frequency of Communication with Friends and Family: More than three times a week    Frequency of Social Gatherings with Friends and Family: Once a week    Attends Religious Services: Never    Database administrator or Organizations: No    Attends Engineer, structural:  Never    Marital Status: Married    Tobacco Counseling Counseling given: Not Answered   Clinical Intake:  Pre-visit preparation completed: Yes  Pain : No/denies pain  BMI - recorded: 36.61 Nutritional Status: BMI > 30  Obese Nutritional Risks: None Diabetes: Yes CBG done?: No Did pt. bring in CBG monitor from home?: No  How often do you need to have someone help you when you read instructions, pamphlets, or other written materials from your doctor or pharmacy?: 1 - Never   Activities of Daily Living    06/28/2022  3:18 PM  In your present state of health, do you have any difficulty performing the following activities:  Hearing? 0  Vision? 0  Difficulty concentrating or making decisions? 1  Comment some forgetfullness  Walking or climbing stairs? 0  Dressing or bathing? 0  Doing errands, shopping? 1  Comment husband assists  Preparing Food and eating ? Y  Comment husband assists preparing food  Using the Toilet? N  In the past six months, have you accidently leaked urine? Y  Do you have problems with loss of bowel control? N  Managing your Medications? N  Managing your Finances? N  Housekeeping or managing your Housekeeping? Y  Comment husband assists    Patient Care Team: [name], Gwenlyn Found, MD as PCP - General (Family Medicine) Day, Dannielle Karvonen, [facility] (Inactive) as Pharmacist (Pharmacist)  Indicate any recent Medical Services you may have received from other than [facility] providers in the past year (date may be approximate).     Assessment:   This is a routine wellness examination for Iantha.  Hearing/Vision screen No results found.  Dietary issues and exercise activities discussed: Current Exercise Habits: The patient does not participate in regular exercise at present (has started doing some leg exercises), Exercise limited by: orthopedic condition(s)   Goals Addressed   None    Depression Screen    06/28/2022    3:13 PM 02/27/2022    9:54 AM 01/31/2021     4:01 PM 07/30/2019   10:28 AM 09/21/2017    9:27 AM 09/20/2016   11:49 AM 11/10/2015    1:49 PM  PHQ 2/9 Scores  PHQ - 2 Score 0 0 0 0 0 0 0    Fall Risk    06/28/2022    3:11 PM 02/27/2022    9:54 AM 01/31/2021    3:58 PM 08/02/2020    3:40 PM 07/30/2019   10:27 AM  Fall Risk   Falls in the past year? 1 0 0 0 0  Number falls in past yr: 0 0 0 0 0  Injury with Fall? 0 0 0 0   Risk for fall due to : Impaired balance/gait No Fall Risks  Impaired balance/gait   Follow up Falls evaluation completed Falls evaluation completed Falls prevention discussed Falls evaluation completed     FALL RISK PREVENTION PERTAINING TO THE HOME:  Any stairs in or around the home? Yes  If so, are there any without handrails? No  Home free of loose throw rugs in walkways, pet beds, electrical cords, etc? Yes  Adequate lighting in your home to reduce risk of falls? Yes   ASSISTIVE DEVICES UTILIZED TO PREVENT FALLS:  Life alert? No  Use of a cane, walker or w/c? Yes  Grab bars in the bathroom? No  Shower chair or bench in shower? Yes  Elevated toilet seat or a handicapped toilet? Yes   TIMED UP AND GO:  Was the test performed? Yes .  Length of time to ambulate 10 feet: 10 sec.   Gait slow and steady with assistive device  Cognitive Function:    06/28/2022    3:30 PM 09/21/2017    9:33 AM  MMSE - Mini Mental State Exam  Not completed: Unable to complete   Orientation to time  5  Orientation to Place  5  Registration  3  Attention/ Calculation  5  Recall  3  Language- name 2 objects  2  Language- repeat  0  Language- follow 3 step command  3  Language- read & follow direction  1  Write a sentence  1  Copy design  1  Total score  29        Immunizations Immunization History  Administered Date(s) Administered   Fluad Quad(high Dose 65+) 12/15/2018   Influenza Split 01/04/2012, 02/10/2022   Influenza, High Dose Seasonal PF 11/10/2015   Influenza,inj,Quad PF,6+ Mos 01/22/2014    Influenza-Unspecified 12/14/2012, 09/27/2017, 12/31/2019, 01/08/2021   PFIZER(Purple Top)SARS-COV-2 Vaccination 04/06/2019, 04/29/2019   Pfizer Covid-19 Vaccine Bivalent Booster 70yrs & up 01/13/2021   Pneumococcal Conjugate-13 01/22/2014   Pneumococcal Polysaccharide-23 01/04/2012   Td 01/22/2014   Zoster Recombinat (Shingrix) 03/14/2019, 04/21/2019    TDAP status: Up to date  Flu Vaccine status: Up to date  Pneumococcal vaccine status: Up to date  Covid-19 vaccine status: Information provided on how to obtain vaccines.   Qualifies for Shingles Vaccine? Yes   Zostavax completed No   Shingrix Completed?: Yes  Screening Tests Health Maintenance  Topic Date Due   COVID-19 Vaccine (4 - 2023-24 season) 10/14/2021   Medicare Annual Wellness (AWV)  01/31/2022   OPHTHALMOLOGY EXAM  04/08/2022   FOOT EXAM  08/25/2022   INFLUENZA VACCINE  09/14/2022   HEMOGLOBIN A1C  11/28/2022   Diabetic kidney evaluation - eGFR measurement  02/28/2023   Diabetic kidney evaluation - Urine ACR  02/28/2023   DTaP/Tdap/Td (2 - Tdap) 01/23/2024   Pneumonia Vaccine 49+ Years old  Completed   DEXA SCAN  Completed   Zoster Vaccines- Shingrix  Completed   HPV VACCINES  Aged Out    Health Maintenance  Health Maintenance Due  Topic Date Due   COVID-19 Vaccine (4 - 2023-24 season) 10/14/2021   Medicare Annual Wellness (AWV)  01/31/2022   OPHTHALMOLOGY EXAM  04/08/2022    Colorectal cancer screening: No longer required.   Mammogram status: Completed 01/31/22. Repeat every year  Bone Density status: Completed 03/03/21. Results reflect: Bone density results: NORMAL. Repeat every 2 years.  Lung Cancer Screening: (Low Dose CT Chest recommended if Age 74-80 years, 30 pack-year currently smoking OR have quit w/in 15years.) does not qualify.   Additional Screening:  Hepatitis C Screening: does not qualify  Vision Screening: Recommended annual ophthalmology exams for early detection of glaucoma and  other disorders of the eye. Is the patient up to date with their annual eye exam?  Yes  Who is the provider or what is the name of the office in which the patient attends annual eye exams? Dr. Burgess Estelle If pt is not established with a provider, would they like to be referred to a provider to establish care? No .   Dental Screening: Recommended annual dental exams for proper oral hygiene  Community Resource Referral / Chronic Care Management: CRR required this visit?  No   CCM required this visit?  No      Plan:     I have personally reviewed and noted the following in the patient's chart:   Medical and social history Use of alcohol, tobacco or illicit drugs  Current medications and supplements including opioid prescriptions. Patient is not currently taking opioid prescriptions. Functional ability and status Nutritional status Physical activity Advanced directives List of other physicians Hospitalizations, surgeries, and ER visits in previous 12 months Vitals Screenings to include cognitive, depression, and falls Referrals and appointments  In addition, I have reviewed and discussed with patient certain preventive protocols, quality metrics, and best practice recommendations. A written personalized care plan for preventive services as well as general preventive  health recommendations were provided to patient.     Donne Anon, [place]   06/28/2022   Nurse Notes: None

## 2022-07-17 ENCOUNTER — Other Ambulatory Visit: Payer: Self-pay | Admitting: Family Medicine

## 2022-07-17 DIAGNOSIS — I10 Essential (primary) hypertension: Secondary | ICD-10-CM

## 2022-09-19 ENCOUNTER — Other Ambulatory Visit: Payer: Self-pay | Admitting: Family Medicine

## 2022-09-19 DIAGNOSIS — E782 Mixed hyperlipidemia: Secondary | ICD-10-CM

## 2022-10-10 NOTE — Telephone Encounter (Signed)
Pt called and says she went to get a months worth of her Jardiance and it was over $100 for just 1 month supply. She wonders if there is an alternate Rx that she can take in its place?

## 2022-10-12 NOTE — Telephone Encounter (Signed)
Called pt- it sounds like she is in the donut hole.  Will consult PharmD Eckard for any ideas

## 2022-10-13 ENCOUNTER — Encounter: Payer: Self-pay | Admitting: Pharmacist

## 2022-10-13 ENCOUNTER — Other Ambulatory Visit: Payer: Medicare Other | Admitting: Pharmacist

## 2022-10-13 MED ORDER — ONETOUCH ULTRASOFT LANCETS MISC: 12 refills | Status: AC

## 2022-10-13 MED ORDER — DAPAGLIFLOZIN PROPANEDIOL 10 MG PO TABS: 10.0000 mg | ORAL_TABLET | Freq: Every day | ORAL | 0 refills | Status: DC

## 2022-10-13 MED ORDER — ONETOUCH VERIO W/DEVICE KIT: PACK | 0 refills | Status: AC

## 2022-10-13 MED ORDER — ONETOUCH VERIO VI STRP: ORAL_STRIP | 1 refills | Status: AC

## 2022-10-13 NOTE — Progress Notes (Signed)
10/13/2022 Name: Anne [name] MRN: [id number] DOB: 06/12/1940  Chief Complaint  Patient presents with   Medication Management   Diabetes    Initial visit - medication cost    Anne [name] is a 82 y.o. year old female who presented for a telephone visit.   They were referred to the pharmacist by their PCP for assistance in managing diabetes and medication access.    Subjective:  Care Team: Primary Care Provider: Pearline Cables, MD ; Next Scheduled Visit: not currently scheduled.   Medication Access/Adherence  Patient reports affordability concerns with their medications: Yes  - She had been getting Jardiance for $135 for 90 days or $45 per 30 days but she has reached Medicare coverage gap and cost for September is $168 and will be higher in October.  Patient reports access/transportation concerns to their pharmacy: No  Patient reports adherence concerns with their medications:  No     Refill History: Atorvastatin 20mg  - filled for 90 DS 05/30/2022 and 09/19/2022 (has missed 22 days for 2024 so far) losartan hydrochlorothiazide - LR was 07/17/2022 for 90 DS  metformin - LR was 05/30/2022 for #180 - 90 DS. Needs updated prescription.  Diabetes:  Current medications:  Jardiance 25mg  daily; metformin 500mg  twice a day. However patient states she only  has 1 tablet of Jardiance left and cannot afford it now that she is in coverage gap. She also reports that she stopped taking metformin because she did not feel that she needed it with Jardiance.   Current glucose readings: does not check blood glucose at home. Does not have a glucometer at home.   Current meal patterns:  Eating green salads and more vegetables until she was with her son last month. He lives in [place] and had brain surgery so she was    Hypertension:  Current medications: losartan- hydrochlorothiazide 50/12.5mg  daily  Patient has a validated, automated, upper arm home BP cuff Current blood pressure  readings readings: 135 - 140/70 - 80  BP Readings from Last 3 Encounters:  06/28/22 (!) 149/75  05/29/22 134/80  02/27/22 132/82    Hyperlipidemia/ASCVD Risk Reduction  Current lipid lowering medications: atorvastatin  Medications tried in the past: none  Antiplatelet regimen: aspirin 81mg  daily     Objective:  Lab Results  Component Value Date   HGBA1C 8.8 (H) 05/29/2022    Lab Results  Component Value Date   CREATININE 0.82 02/27/2022   BUN 12 02/27/2022   NA 138 02/27/2022   K 4.0 02/27/2022   CL 99 02/27/2022   CO2 27 02/27/2022    Lab Results  Component Value Date   CHOL 158 08/24/2021   HDL 51.80 08/24/2021   LDLCALC 77 08/24/2021   TRIG 148.0 08/24/2021   CHOLHDL 3 08/24/2021    Medications Reviewed Today   Medications were not reviewed in this encounter       Assessment/Plan:   Diabetes:Currently not at A1c goal of < 7.5% - Reviewed dietary modifications including continuing to eat non starchy vegetable and  to limit sugar intake.  - Recommend to restart metformin 500mg  twice a day with food.  She reports she does not need refill at this time but she will need new Rx for the next time she does need refilled.  - Screened for LIS / Medicare Extra Help. Patient's household size is 2 people. She was not sure of her exact household income but she sure that it is above $3500 per month so she  is above the LIS cut off of about $2610 to $2860 per month for a married couple. - consulted with PCP - we will have patient take [place] 10mg  daily for the next 30 days while we start application for [facility] Care / Jardiance 25mg  daily. If she is not able to get Jardiance thru patient assistance program then will try to get [place].  Will collaborate with provider, CPhT, and patient to pursue assistance.  - Sent in prescription for glucometer, test strips and lancets- looks like One Touch Verio should be covered but OK for pharmacy to switch if other glucometer is  preferred. Recommend to check glucose once a day. Reviewed blood glucose goals.  Fasting blood glucose goal (before meals) = 80 to 130 Blood glucose goal after a meal = less than 180    Hypertension: Currently uncontrolled per last office blood pressure but her home blood pressure readings have been mostly < 140/90 - Reviewed long term cardiovascular and renal outcomes of uncontrolled blood pressure - continue to check blood pressure daily and record. Reminded patient that RF for losartan hydrochlorothiazide will be due soon.  - Plan to check back with patient on blood pressure readings in 1 weeks - if above goal could consider increasing dose of losartan.    Hyperlipidemia/ASCVD Risk Reduction:Currently controlled. LDL goal < 100 - Reviewed long term complications of uncontrolled cholesterol - Recommend to continue atorvastatin   Follow Up Plan: 1 week to check blood pressure and blood glucose.   Anne [name], PharmD Clinical Pharmacist [facility]

## 2022-10-19 ENCOUNTER — Telehealth: Payer: Self-pay

## 2022-10-19 NOTE — Telephone Encounter (Signed)
Mailing BI CARES application to pt's home for [place] assistance.

## 2022-10-19 NOTE — Telephone Encounter (Signed)
-----   Message from [name] sent at 10/13/2022  5:16 PM EDT ----- Patient will get 30 days of Farxiga for free with coupon but we would like to get patient assistance program for Jardiance since she has tolerated Jardiance 25mg . Please start process for [facility] Cares patient assistance program>

## 2022-10-20 ENCOUNTER — Other Ambulatory Visit: Payer: Medicare Other | Admitting: Pharmacist

## 2022-10-24 ENCOUNTER — Telehealth: Payer: Self-pay | Admitting: Family Medicine

## 2022-10-24 NOTE — Telephone Encounter (Signed)
Pt called to find out how many times she should check her blood sugar and what levels she should be at. Please call pt to advise.

## 2022-10-25 NOTE — Telephone Encounter (Signed)
Please give her a call.  Advised I would like her to check her blood sugar perhaps 3 times a week, she can vary the time of day.  Would like her fasting blood sugar ideally less than 150, if she is even less than 250 is fine.  I think it is about time for her to come in for an A1c with me, please schedule an appointment for her to see me at her convenience

## 2022-10-26 ENCOUNTER — Encounter: Payer: Self-pay | Admitting: Pharmacist

## 2022-10-26 ENCOUNTER — Other Ambulatory Visit: Payer: Medicare Other | Admitting: Pharmacist

## 2022-10-26 NOTE — Progress Notes (Signed)
.[name]  10/26/2022 Name: Anne [name] MRN: [id number] DOB: August 21, 1940   Chief Complaint  Patient presents with   Diabetes   Hypertension    Anne [name] is a 82 y.o. year old female who called Clinical Pharmacist Practitioner today, a phone visit was completed.    Subjective: They were referred to the pharmacist by their PCP for assistance in managing diabetes and medication access.  Patient was also showing on list as not meeting blood pressure goal. Last blood pressure was 149/75 on 05/1452024  But last blood pressure was at goal :  10/26/22 126/70    Care Team: Primary Care Provider: Pearline Cables, MD ; Next Scheduled Visit: not currently scheduled.   Medication Access/Adherence  Patient reports affordability concerns with their medications: Yes  - She had been getting Jardiance for $135 for 90 days or $45 per 30 days but she has reached Medicare coverage gap and cost for September is $168 and will be higher in October.  Patient reports access/transportation concerns to their pharmacy: No  Patient reports adherence concerns with their medications:  No     Refill History: Atorvastatin 20mg  - filled for 90 DS 05/30/2022 and 09/19/2022 (has missed 22 days for 2024 so far) losartan hydrochlorothiazide - LR was 10/16/2022 for 90 DS  metformin - LR was 05/30/2022 for #180 - 90 DS. Patient report she has plenty because she stopped taking when she was taking Jardiance.   Diabetes:  Current medications:  metformin 500mg  twice a day and Farxiga 10mg  daily. Patient will change back to Jardiance 25mg  daily if able to get patient assistance program approved.   Just started checking blood glucose recently - prescribed glucometer at last visit.  Current glucose readings: 159, 198, 161  Current meal patterns:  Eating green salads and more vegetables. Trying to limit intake of sugar/ sweets.   Hypertension:  Current medications: losartan- hydrochlorothiazide 50/12.5mg   daily  Patient has a validated, automated, upper arm home BP cuff Current blood pressure readings readings: 120 - 140/70 - 80  Today's blood pressure checked by patient with her home blood pressure cuff is noted below.   BP Readings from Last 3 Encounters:  10/26/22 126/70  06/28/22 (!) 149/75  05/29/22 134/80   Patient denies signs and symptome of hypotension - no dizziness, syncope   Hyperlipidemia/ASCVD Risk Reduction  Current lipid lowering medications: atorvastatin  Medications tried in the past: none  Antiplatelet regimen: aspirin 81mg  daily     Objective:  Vitals:   10/26/22 1506  BP: 126/70  Pulse: 60     Lab Results  Component Value Date   HGBA1C 8.8 (H) 05/29/2022    Lab Results  Component Value Date   CREATININE 0.82 02/27/2022   BUN 12 02/27/2022   NA 138 02/27/2022   K 4.0 02/27/2022   CL 99 02/27/2022   CO2 27 02/27/2022    Lab Results  Component Value Date   CHOL 158 08/24/2021   HDL 51.80 08/24/2021   LDLCALC 77 08/24/2021   TRIG 148.0 08/24/2021   CHOLHDL 3 08/24/2021    Medications Reviewed Today     Reviewed by [name], RPH-CPP (Pharmacist) on 10/26/22 at 1506  Med List Status: <None>   Medication Order Taking? Sig Documenting Provider Last Dose Status Informant  acetaminophen (TYLENOL) 500 MG tablet [id number]  Take 500 mg by mouth every 6 (six) hours as needed for mild pain. [provider]  Active Self  aspirin 81 MG tablet [id number] Yes Take  81 mg by mouth at bedtime.  [provider] Taking Active Self  atorvastatin (LIPITOR) 20 MG tablet [id number] Yes TAKE 1 TABLET(20 MG) BY MOUTH DAILY [name], Gwenlyn Found, MD Taking Active   b complex vitamins tablet [id number] Yes Take 1 tablet by mouth daily. [provider] Taking Active Self  Blood Glucose Monitoring Suppl [facility] VERIO) w/Device KIT [id number] Yes Use to check blood glucose once a day (Dx: E11.9 - type 2 DM) [name], Gwenlyn Found, MD Taking  Active   Calcium-Phosphorus-Vitamin D (CITRACAL +D3 PO) [id number] Yes Take 2 tablets by mouth daily.  [provider] Taking Active Self  Cholecalciferol (VITAMIN D-3) 5000 UNITS TABS [id number] Yes Take 1 capsule by mouth daily.  [provider] Taking Active Self           Med Note Henderson Newcomer, DENISE P   Wed Jan 14, 2020  1:22 PM) .  dapagliflozin propanediol (FARXIGA) 10 MG TABS tablet [id number] No Take 1 tablet (10 mg total) by mouth daily before breakfast.  Patient not taking: Reported on 10/26/2022   [name], Gwenlyn Found, MD Not Taking Active   empagliflozin (JARDIANCE) 25 MG TABS tablet [id number] Yes Take 1 tablet (25 mg total) by mouth daily before breakfast. [name], Gwenlyn Found, MD Taking Active   famotidine (PEPCID) 20 MG tablet [id number]  TAKE 1/2 TABLET(10 MG) BY MOUTH DAILY  Patient taking differently: Take 10 mg by mouth daily as needed.   [name], Gwenlyn Found, MD  Active   glucose blood (ONETOUCH VERIO) test strip [id number] Yes Use to check blood glucose once a day (Dx: E11.9 - type 2 DM) [name], Gwenlyn Found, MD Taking Active   Glycerin-Hypromellose-PEG 400 ([facility] DRY EYE RELIEF OP) [id number]  Apply 2 drops to eye as needed.  [provider]  Active Self  hydrochlorothiazide (MICROZIDE) 12.5 MG capsule [id number] No Take 1 capsule (12.5 mg total) by mouth as needed. Take with losartan/hctz to equal 25 of hctz total  Patient not taking: Reported on 10/13/2022   [name], Gwenlyn Found, MD Not Taking Active   Lancets [facility] ULTRASOFT) lancets [id number] Yes Use to check blood glucose once a day (Dx: E11.9 - type 2 DM) [name], Gwenlyn Found, MD Taking Active   levothyroxine (SYNTHROID) 125 MCG tablet [id number] Yes Take 1 tablet (125 mcg total) by mouth daily before breakfast. [name], Gwenlyn Found, MD Taking Active   losartan-hydrochlorothiazide (HYZAAR) 50-12.5 MG tablet [id number] Yes TAKE 1 TABLET BY MOUTH DAILY [name], Gwenlyn Found, MD Taking Active   metFORMIN (GLUCOPHAGE) 500 MG  tablet [id number] Yes Take 1 tablet (500 mg total) by mouth 2 (two) times daily with a meal. [name], Gwenlyn Found, MD Taking Active   methocarbamol (ROBAXIN) 500 MG tablet [id number]  Take 1 tablet by mouth every 8 (eight) hours as needed. [provider]  Active   trolamine salicylate (ASPERCREME) 10 % cream [id number]  Apply 1 application topically as needed for muscle pain. [provider]  Active Self              Assessment/Plan:   Diabetes:Currently not at A1c goal of < 7.5% - Continue metformin 500mg  twice a day with food and Farxiga 10mg  daily (will change back to Jardiance 25mg  if approved for medication assistance program)  .  - Patient has not received medication assistance program for [facility] yet. Was mailed 1 week ago. Will have MedAssist Team check back with her 9/16 - if she still has not received then will have them mail  again.  Collaborating with provider, CPhT, and patient to pursue assistance.  - Reviewed blood glucose goals with patinet - recommended she check 3 times per week or every other day. Fasting blood glucose goal (before meals) = 80 to 130 Blood glucose goal after a meal = less than 180    Hypertension: Currently controlled per home blood pressure today  - Reviewed long term cardiovascular and renal outcomes of uncontrolled blood pressure - continue to check blood pressure daily and record.   Hyperlipidemia/ASCVD Risk Reduction:Currently controlled. LDL goal < 100 - Reviewed long term complications of uncontrolled cholesterol - Recommend to continue atorvastatin   Follow Up Plan: - Plan to check back with patient in 2 to 4 weeks to check blood pressure and BG   [name], PharmD Clinical Pharmacist [facility]

## 2022-10-27 NOTE — Telephone Encounter (Signed)
Pt is aware and apt has been made.

## 2022-10-28 NOTE — Progress Notes (Unsigned)
[facility] [address] [place], [place] [address] [phone]  Date:  11/02/2022   Name:  Anne [name]   DOB:  07/10/1940   MRN:  [id number]  PCP:  Pearline Cables, MD    Chief Complaint: No chief complaint on file.   History of Present Illness:  Anne [name] is a 82 y.o. very pleasant female patient who presents with the following:  Patient seen today for diabetes follow-up Most recent visit with myself was in April of this year History of controlled diabetes, hyperlipidemia, hypertension, hypothyroidism, diverticulitis, thyroid cancer status post thyroidectomy in 2011   Married to [place], they have several children and grandchildren-seen today accompanied by her husband  Eye exam Foot exam Flu shot COVID booster Lab Results  Component Value Date   HGBA1C 8.8 (H) 05/29/2022    Aspirin 81 Lipitor 20 Metformin 500 twice daily Jardiance 25 Levothyroxine 125 Losartan/HCTZ 50/12.5 Patient Active Problem List   Diagnosis Date Noted   Diverticulitis 08/02/2020   Controlled type 2 diabetes mellitus without complication, without long-term current use of insulin (HCC) 03/31/2015   Chest pressure 03/03/2013   Globus sensation 03/03/2013   DOE (dyspnea on exertion) 03/03/2013   Thyroid cancer (HCC) 01/05/2012   Hyperlipidemia 01/05/2012   Dysphonia 01/05/2012   Osteoarthritis, hip, 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   Hypertension 01/04/2012   Hypothyroid 01/04/2012    Past Medical History:  Diagnosis Date   Dental crowns status    01-14-2020 per pt had upper left front tooth abscess and has temporary crown from 2 wks ago,  pt stated abscess resolved and crown is not loose   Dyspnea    per pt gets sob at times with exertion   GERD (gastroesophageal reflux disease)    History of recent fall    per pt fell 01-08-2020 has bruised hip/ buttock / back area's and hit head ,  pt stated see provider, denies any  dizziness, headache, or balance issues other than before   Hypertension    followed by pcp  (nulcear study in epic 03-06-2013 normwl perfusion w/ no ischemia, nuclear ef 74%)   Hypothyroidism, 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   followed by pcp---  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)   OA (osteoarthritis)    PMB (postmenopausal bleeding)    Thickened endometrium    Type 2 diabetes mellitus (HCC)    followed by pcp   Wears glasses     Past Surgical History:  Procedure Laterality Date   CATARACT EXTRACTION W/ INTRAOCULAR LENS  IMPLANT, BILATERAL  2008   THYROIDECTOMY  12/25/2009   TONSILLECTOMY AND ADENOIDECTOMY  age 62   TOTAL HIP ARTHROPLASTY Bilateral left 10/07/2007;  right 06-03-2008  @[facility]     Social History   Tobacco Use   Smoking status: Never   Smokeless tobacco: Never  Vaping Use   Vaping status: Never Used  Substance Use Topics   Alcohol use: No   Drug use: Never    Family History  Problem Relation Age of Onset   Stroke Mother    Cancer Father    Cancer Sister    Cancer Sister    Cancer Sister    Miscarriages / Stillbirths Neg Hx    Breast cancer Neg Hx     Allergies  Allergen Reactions   Amoxicillin Itching    vaginal itching and bleeding (side effect)    Medication list has been reviewed and updated.  Current  Outpatient Medications on File Prior to Visit  Medication Sig Dispense Refill   acetaminophen (TYLENOL) 500 MG tablet Take 500 mg by mouth every 6 (six) hours as needed for mild pain.     aspirin 81 MG tablet Take 81 mg by mouth at bedtime.      atorvastatin (LIPITOR) 20 MG tablet TAKE 1 TABLET(20 MG) BY MOUTH DAILY 90 tablet 3   b complex vitamins tablet Take 1 tablet by mouth daily.     Blood Glucose Monitoring Suppl (ONETOUCH VERIO) w/Device KIT Use to check blood glucose once a day (Dx: E11.9 - type 2 DM) 1 kit 0   Calcium-Phosphorus-Vitamin D (CITRACAL +D3 PO) Take 2 tablets by mouth daily.      Cholecalciferol (VITAMIN D-3) 5000 UNITS  TABS Take 1 capsule by mouth daily.      dapagliflozin propanediol (FARXIGA) 10 MG TABS tablet Take 1 tablet (10 mg total) by mouth daily before breakfast. (Patient not taking: Reported on 10/26/2022) 30 tablet 0   empagliflozin (JARDIANCE) 25 MG TABS tablet Take 1 tablet (25 mg total) by mouth daily before breakfast. 90 tablet 3   famotidine (PEPCID) 20 MG tablet TAKE 1/2 TABLET(10 MG) BY MOUTH DAILY (Patient taking differently: Take 10 mg by mouth daily as needed.) 45 tablet 3   glucose blood (ONETOUCH VERIO) test strip Use to check blood glucose once a day (Dx: E11.9 - type 2 DM) 100 each 1   Glycerin-Hypromellose-PEG 400 ([employer] DRY EYE RELIEF OP) Apply 2 drops to eye as needed.      hydrochlorothiazide (MICROZIDE) 12.5 MG capsule Take 1 capsule (12.5 mg total) by mouth as needed. Take with losartan/hctz to equal 25 of hctz total (Patient not taking: Reported on 10/13/2022) 90 capsule 3   Lancets (ONETOUCH ULTRASOFT) lancets Use to check blood glucose once a day (Dx: E11.9 - type 2 DM) 100 each 12   levothyroxine (SYNTHROID) 125 MCG tablet Take 1 tablet (125 mcg total) by mouth daily before breakfast. 90 tablet 3   losartan-hydrochlorothiazide (HYZAAR) 50-12.5 MG tablet TAKE 1 TABLET BY MOUTH DAILY 90 tablet 3   metFORMIN (GLUCOPHAGE) 500 MG tablet Take 1 tablet (500 mg total) by mouth 2 (two) times daily with a meal. 180 tablet 3   methocarbamol (ROBAXIN) 500 MG tablet Take 1 tablet by mouth every 8 (eight) hours as needed.     trolamine salicylate (ASPERCREME) 10 % cream Apply 1 application topically as needed for muscle pain.     No current facility-administered medications on file prior to visit.    Review of Systems:  As per HPI- otherwise negative.   Physical Examination: There were no vitals filed for this visit. There were no vitals filed for this visit. There is no height or weight on file to calculate BMI. Ideal Body Weight:    GEN: no acute distress. HEENT: Atraumatic,  Normocephalic.  Ears and Nose: No external deformity. CV: RRR, No M/G/R. No JVD. No thrill. No extra heart sounds. PULM: CTA B, no wheezes, crackles, rhonchi. No retractions. No resp. distress. No accessory muscle use. ABD: S, NT, ND, +BS. No rebound. No HSM. EXTR: No c/c/e PSYCH: Normally interactive. Conversant.  Foot exam  Assessment and Plan: ***  Signed Abbe Amsterdam, MD

## 2022-10-28 NOTE — Patient Instructions (Incomplete)
It was good to see you again today!

## 2022-10-31 DIAGNOSIS — T1512XA Foreign body in conjunctival sac, left eye, initial encounter: Secondary | ICD-10-CM | POA: Diagnosis not present

## 2022-10-31 DIAGNOSIS — D23121 Other benign neoplasm of skin of left upper eyelid, including canthus: Secondary | ICD-10-CM | POA: Diagnosis not present

## 2022-11-02 ENCOUNTER — Ambulatory Visit (INDEPENDENT_AMBULATORY_CARE_PROVIDER_SITE_OTHER): Payer: Medicare Other | Admitting: Family Medicine

## 2022-11-02 VITALS — BP 132/72 | HR 75 | Temp 98.3°F | Resp 18 | Ht 65.0 in | Wt 215.0 lb

## 2022-11-02 DIAGNOSIS — Z7984 Long term (current) use of oral hypoglycemic drugs: Secondary | ICD-10-CM | POA: Diagnosis not present

## 2022-11-02 DIAGNOSIS — I10 Essential (primary) hypertension: Secondary | ICD-10-CM | POA: Diagnosis not present

## 2022-11-02 DIAGNOSIS — E119 Type 2 diabetes mellitus without complications: Secondary | ICD-10-CM

## 2022-11-02 DIAGNOSIS — E782 Mixed hyperlipidemia: Secondary | ICD-10-CM | POA: Diagnosis not present

## 2022-11-02 DIAGNOSIS — E890 Postprocedural hypothyroidism: Secondary | ICD-10-CM

## 2022-11-02 LAB — HEMOGLOBIN A1C: Hgb A1c MFr Bld: 8.3 % — ABNORMAL HIGH (ref 4.6–6.5)

## 2022-11-02 LAB — CBC
HCT: 42.4 % (ref 36.0–46.0)
Hemoglobin: 14.1 g/dL (ref 12.0–15.0)
MCHC: 33.4 g/dL (ref 30.0–36.0)
MCV: 91.7 fl (ref 78.0–100.0)
Platelets: 221 10*3/uL (ref 150.0–400.0)
RBC: 4.62 Mil/uL (ref 3.87–5.11)
RDW: 13.7 % (ref 11.5–15.5)
WBC: 4.9 10*3/uL (ref 4.0–10.5)

## 2022-11-02 LAB — LIPID PANEL
Cholesterol: 158 mg/dL (ref 0–200)
HDL: 53.2 mg/dL (ref >39.00)
LDL Cholesterol: 75 mg/dL (ref 0–99)
NonHDL: 104.79
Total CHOL/HDL Ratio: 3
Triglycerides: 148 mg/dL (ref 0.0–149.0)
VLDL: 29.6 mg/dL (ref 0.0–40.0)

## 2022-11-02 LAB — COMPREHENSIVE METABOLIC PANEL
ALT: 20 U/L (ref 0–35)
AST: 23 U/L (ref 0–37)
Albumin: 4.3 g/dL (ref 3.5–5.2)
Alkaline Phosphatase: 82 U/L (ref 39–117)
BUN: 15 mg/dL (ref 6–23)
CO2: 27 mEq/L (ref 19–32)
Calcium: 10 mg/dL (ref 8.4–10.5)
Chloride: 101 mEq/L (ref 96–112)
Creatinine, Ser: 0.78 mg/dL (ref 0.40–1.20)
GFR: 70.65 mL/min (ref >60.00)
Glucose, Bld: 143 mg/dL — ABNORMAL HIGH (ref 70–99)
Potassium: 3.7 mEq/L (ref 3.5–5.1)
Sodium: 141 mEq/L (ref 135–145)
Total Bilirubin: 0.7 mg/dL (ref 0.2–1.2)
Total Protein: 7.5 g/dL (ref 6.0–8.3)

## 2022-11-02 LAB — TSH: TSH: 0.37 u[IU]/mL (ref 0.35–5.50)

## 2022-11-02 MED ORDER — METFORMIN HCL 500 MG PO TABS
500.0000 mg | ORAL_TABLET | Freq: Two times a day (BID) | ORAL | 3 refills | Status: DC
Start: 2022-11-02 — End: 2023-06-07

## 2022-11-14 ENCOUNTER — Telehealth: Payer: Self-pay | Admitting: Family Medicine

## 2022-11-14 ENCOUNTER — Encounter: Payer: Self-pay | Admitting: Family Medicine

## 2022-11-14 DIAGNOSIS — Z23 Encounter for immunization: Secondary | ICD-10-CM | POA: Diagnosis not present

## 2022-11-14 NOTE — Telephone Encounter (Signed)
Prescription Request  11/14/2022  Is this a "Controlled Substance" medicine? Yes  LOV: 11/02/2022  What is the name of the medication or equipment? dapagliflozin propanediol (FARXIGA) 10 MG TABS tablet  Have you contacted your pharmacy to request a refill? No   Which pharmacy would you like this sent to?    [facility] Address: [address] [place], [place] [address] Phone: [phone] Hours:  Open ? Closes 11?PM  Patient notified that their request is being sent to the clinical staff for review and that they should receive a response within 2 business days.   Please advise at [facility] [phone]

## 2022-11-15 ENCOUNTER — Other Ambulatory Visit: Payer: Self-pay

## 2022-11-15 DIAGNOSIS — E119 Type 2 diabetes mellitus without complications: Secondary | ICD-10-CM

## 2022-11-15 MED ORDER — DAPAGLIFLOZIN PROPANEDIOL 10 MG PO TABS: 10.0000 mg | ORAL_TABLET | Freq: Every day | ORAL | 3 refills | Status: DC

## 2022-11-15 NOTE — Telephone Encounter (Signed)
Refill sent.

## 2022-11-16 ENCOUNTER — Other Ambulatory Visit: Payer: Medicare Other | Admitting: Pharmacist

## 2022-11-16 NOTE — Progress Notes (Addendum)
11/16/2022 Name: Anne [name] MRN: [id number] DOB: September 20, 1940   Chief Complaint  Patient presents with   Medication Management   Diabetes   Hypertension    Anne [name] is a 82 y.o. year old female who called Clinical Pharmacist Practitioner today, a phone visit was completed.    Subjective: They were referred to the pharmacist by their PCP for assistance in managing diabetes, hypertension, and medication access.   Care Team: Primary Care Provider: Pearline Cables, MD ; Last visit was 11/02/2022; No upcoming visit scheduled at this time.  Medication Access/Adherence  Patient reports affordability concerns with their medications: Yes  - She had been getting Jardiance for $135 for 90 days or $45 per 30 days but she has reached Medicare coverage gap and cost for September is $168 and will be higher in October.  Patient reports access/transportation concerns to their pharmacy: No  Patient reports adherence concerns with their medications:  No     Refill History: Atorvastatin 20mg  - filled for 90 DS 05/30/2022 and 09/19/2022 (has missed 22 days for 2024 so far) losartan hydrochlorothiazide - LR was 10/16/2022 for 90 DS  metformin - LR was 05/30/2022 for #180 - 90 DS. Patient report she has plenty because earlier in 2024 she stopped taking when she was taking Jardiance. She restarted metformin about 2 months ago and reports taking as directed,  twice a day now.  Diabetes:  Current medications:  metformin 500mg  twice a day and Farxiga 10mg  daily. Plan was to change back to Jardiance 25mg  daily if she qualified but patient assistance program but today patient states she reviewed the income cut off for the [facility] Cares program and she did not qualify. She would like to continue [place].   Uses One Touch Verio glucometer;  Checking blood glucose 2 or 3 times per week Current glucose readings: 145, 138, 129, 132  Current meal patterns:  Eating green salads at least once a day. Trying  to limit intake of sugar/ sweets. She does like to have juice in the morning. She sometimes adds a little water.   Exercise: exercise bike or walking 2 to 3 times per week Annual Eye Exam - per patient she has seen Dr Burgess Estelle twice in 2024.   Hypertension:  Current medications: losartan- hydrochlorothiazide 50/12.5mg  daily  Patient has a validated, automated, upper arm home BP cuff but she would like a large cuff Checking 3 times per week Current blood pressure readings readings: 128/66; 121/63; 128/62 Pulse in the 60 to 77  Today's blood pressure checked by patient with her home blood pressure cuff is noted below.   BP Readings from Last 3 Encounters:  11/02/22 132/72  10/26/22 126/70  06/28/22 (!) 149/75   Patient denies signs and symptome of hypotension - no dizziness, syncope   Hyperlipidemia/ASCVD Risk Reduction  Current lipid lowering medications: atorvastatin  Medications tried in the past: none  Antiplatelet regimen: aspirin 81mg  daily    Objective:    Lab Results  Component Value Date   HGBA1C 8.3 (H) 11/02/2022    Lab Results  Component Value Date   CREATININE 0.78 11/02/2022   BUN 15 11/02/2022   NA 141 11/02/2022   K 3.7 11/02/2022   CL 101 11/02/2022   CO2 27 11/02/2022    Lab Results  Component Value Date   CHOL 158 11/02/2022   HDL 53.20 11/02/2022   LDLCALC 75 11/02/2022   TRIG 148.0 11/02/2022   CHOLHDL 3 11/02/2022    Medications  Reviewed Today     Reviewed by [name], RPH-CPP (Pharmacist) on 11/16/22 at [phone]  Med List Status: <None>   Medication Order Taking? Sig Documenting Provider Last Dose Status Informant  acetaminophen (TYLENOL) 500 MG tablet [id number]  Take 500 mg by mouth every 6 (six) hours as needed for mild pain. [provider]  Active Self  aspirin 81 MG tablet [id number] Yes Take 81 mg by mouth at bedtime.  [provider] Taking Active Self  atorvastatin (LIPITOR) 20 MG tablet [id number] Yes TAKE  1 TABLET(20 MG) BY MOUTH DAILY [name], Gwenlyn Found, MD Taking Active   b complex vitamins tablet [id number] Yes Take 1 tablet by mouth daily. [provider] Taking Active Self  Blood Glucose Monitoring Suppl [facility] VERIO) w/Device KIT [id number] Yes Use to check blood glucose once a day (Dx: E11.9 - type 2 DM) [name], Gwenlyn Found, MD Taking Active   Calcium-Phosphorus-Vitamin D (CITRACAL +D3 PO) [id number] Yes Take 2 tablets by mouth daily.  [provider] Taking Active Self  Cholecalciferol (VITAMIN D-3) 5000 UNITS TABS [id number] Yes Take 1 capsule by mouth daily.  [provider] Taking Active Self           Med Note Henderson Newcomer, DENISE P   Wed Jan 14, 2020  1:22 PM) .  dapagliflozin propanediol (FARXIGA) 10 MG TABS tablet [id number] Yes Take 1 tablet (10 mg total) by mouth daily before breakfast. [name], Gwenlyn Found, MD Taking Active   famotidine (PEPCID) 20 MG tablet [id number]  TAKE 1/2 TABLET(10 MG) BY MOUTH DAILY  Patient taking differently: Take 10 mg by mouth daily as needed.   [name], Gwenlyn Found, MD  Active   glucose blood (ONETOUCH VERIO) test strip [id number] Yes Use to check blood glucose once a day (Dx: E11.9 - type 2 DM) [name], Gwenlyn Found, MD Taking Active   Glycerin-Hypromellose-PEG 400 ([facility] DRY EYE RELIEF OP) [id number]  Apply 2 drops to eye as needed.  [provider]  Active Self  hydrochlorothiazide (MICROZIDE) 12.5 MG capsule [id number] No Take 1 capsule (12.5 mg total) by mouth as needed. Take with losartan/hctz to equal 25 of hctz total  Patient not taking: Reported on 11/16/2022   [name], Gwenlyn Found, MD Not Taking Active   Lancets [facility] ULTRASOFT) lancets [id number] Yes Use to check blood glucose once a day (Dx: E11.9 - type 2 DM) [name], Gwenlyn Found, MD Taking Active   levothyroxine (SYNTHROID) 125 MCG tablet [id number] Yes Take 1 tablet (125 mcg total) by mouth daily before breakfast. [name], Gwenlyn Found, MD Taking Active    losartan-hydrochlorothiazide (HYZAAR) 50-12.5 MG tablet [id number] Yes TAKE 1 TABLET BY MOUTH DAILY [name], Gwenlyn Found, MD Taking Active   metFORMIN (GLUCOPHAGE) 500 MG tablet [id number] Yes Take 1 tablet (500 mg total) by mouth 2 (two) times daily with a meal. [name], Gwenlyn Found, MD Taking Active   methocarbamol (ROBAXIN) 500 MG tablet [id number]  Take 1 tablet by mouth every 8 (eight) hours as needed. [provider]  Active   trolamine salicylate (ASPERCREME) 10 % cream [id number]  Apply 1 application topically as needed for muscle pain. [provider]  Active Self             Assessment/Plan:   Diabetes: Currently not at A1c goal of < 7.5% but last A1c was slightly improved - Continue metformin 500mg  twice a day with food and Farxiga 10mg  daily (will change back to Jardiance 25mg  if approved for medication assistance program)   - Patient  received medication assistance program for Jardiance / BI Cares but per patient household income was a little above cut off (250% FPL).  The cut off for Marcelline Deist is higher (400% FPL) and patient would likely qualify for assistance with [place] for Farxiga. Collaborating with provider, CPhT, and patient to pursue assistance for Farxiga.  - Reviewed diet and serving size recommendations for fruits, crackers and other foods. Patient to look for low sugar juices. Limit to 4 ounces or add water to juices.  - Continue to be physically active. Goal is to get at least 150 minutes of exercise per week.  - Reviewed blood glucose goals with patinet - recommended she check 3 times per week or every other day. Fasting blood glucose goal (before meals) = 80 to 130 Blood glucose goal after a meal = less than 180  - Annual Eye exam completed per patient. Requested 2024 office visits from Dr Huel Coventry office to be faxed to [phone]  Hypertension: Currently controlled per home blood pressure and last office blood pressure   - Reviewed long term  cardiovascular and renal outcomes of uncontrolled blood pressure - continue to check blood pressure daily and record.  - Continue losartan hydrochlorothiazide 50/12.5mg  daily   Hyperlipidemia/ASCVD Risk Reduction:Currently controlled. LDL goal < 100 - Reviewed long term complications of uncontrolled cholesterol - Recommend to continue atorvastatin   Follow Up Plan: - Plan to check back with patient in 2 to 4 weeks to check blood pressure, blood glucose and follow up PAP  [name], PharmD Clinical Pharmacist [facility]

## 2022-11-20 ENCOUNTER — Telehealth: Payer: Self-pay

## 2022-11-20 ENCOUNTER — Other Ambulatory Visit (HOSPITAL_COMMUNITY): Payer: Self-pay

## 2022-11-20 NOTE — Telephone Encounter (Signed)
PAP: PAP application for Farxiga, Publishing rights manager ([place])) has been mailed to pt's home address on file. Will fax provider portion of application to provider's office when pt's portion is received.    Please be advised

## 2022-11-20 NOTE — Telephone Encounter (Signed)
-----   Message from [name] sent at 11/16/2022  9:51 AM EDT ----- Please mail patient app for Anne [name]. She received BI Care for Jardiance but states household income was just above cut off.

## 2022-11-24 DIAGNOSIS — I10 Essential (primary) hypertension: Secondary | ICD-10-CM | POA: Diagnosis not present

## 2022-11-24 DIAGNOSIS — E039 Hypothyroidism, unspecified: Secondary | ICD-10-CM | POA: Diagnosis not present

## 2022-11-24 DIAGNOSIS — Z6835 Body mass index (BMI) 35.0-35.9, adult: Secondary | ICD-10-CM | POA: Diagnosis not present

## 2022-11-24 DIAGNOSIS — E7800 Pure hypercholesterolemia, unspecified: Secondary | ICD-10-CM | POA: Diagnosis not present

## 2022-11-24 DIAGNOSIS — E1159 Type 2 diabetes mellitus with other circulatory complications: Secondary | ICD-10-CM | POA: Diagnosis not present

## 2022-12-05 ENCOUNTER — Other Ambulatory Visit: Payer: Self-pay | Admitting: Pharmacist

## 2022-12-05 NOTE — Progress Notes (Signed)
12/05/2022 Name: Anne [name] MRN: [id number] DOB: [birth year]   Chief Complaint  Patient presents with   Diabetes   Medication Management    Anne [name] is a 82 y.o. year old female who called Clinical Pharmacist Practitioner today, a phone visit was completed.    Subjective: They were referred to the pharmacist by their PCP for assistance in managing diabetes, hypertension, and medication access.   Care Team: Primary Care Provider: Pearline Cables, MD ; Last visit was 11/02/2022; No upcoming visit scheduled at this time.  Medication Access/Adherence  Patient reports affordability concerns with their medications: Yes  - She had been getting Jardiance for $135 for 90 days or $45 per 30 days but she has reached Medicare coverage gap and cost for September would have been $168 and will be higher in October.  Patient reports access/transportation concerns to their pharmacy: No  Patient reports adherence concerns with their medications:  No     Refill History: Atorvastatin 20mg  - filled for 90 DS 05/30/2022 and 09/19/2022 (has missed 22 days for 2024 so far) losartan hydrochlorothiazide - LR was 10/15/2022 for 90 DS  metformin - LR was 11/24/2022 for #180 - 90 DS.  Previously patient reported earlier in 2024 she stopped taking metformin when she was taking Jardiance. She restarted metformin about 2 months ago and reports taking as directed,  twice a day now.  Diabetes:  Current medications:  metformin 500mg  twice a day and Farxiga 10mg  daily. Plan was to change back to Jardiance 25mg  daily if she qualified for patient assistance program but today patient states she reviewed the income cut off for the [facility] Cares program and she did not qualify. She would like to continue [place].  She has not completed [place] medication assistance program application but today she states she and her husband have discussed application and she has decided not to apply for [place] medication  assistance program.   Uses One Touch Verio glucometer;  Checking blood glucose 2 or 3 times per week Current glucose readings: "has been in the green zone"  117, 135, 138  Current meal patterns:  Eating green salads at least once a day.  Continues to limit intake of sugar/ sweets. She does like to have juice in the morning. She sometimes adds a little water to juice so she can use less juice.   Exercise: exercise bike or walking 2 to 3 times per week Annual Eye Exam - UTD - done 05/2022. Requested notes from Dr Burgess Estelle at last visit and notes received.   Hypertension:  Current medications: losartan- hydrochlorothiazide 50/12.5mg  daily  Patient has a validated, automated, upper arm home BP cuff but she would like a large cuff Checking 3 times per week Current blood pressure readings readings: 135/78, 128/66 , 118/62 Pulse 65, 62, 62  BP Readings from Last 3 Encounters:  11/02/22 132/72  10/26/22 126/70  06/28/22 (!) 149/75   Patient denies signs and symptome of hypotension - no dizziness, syncope   Hyperlipidemia/ASCVD Risk Reduction  Current lipid lowering medications: atorvastatin 20mg  daily Medications tried in the past: none  Antiplatelet regimen: aspirin 81mg  daily    Objective:    Lab Results  Component Value Date   HGBA1C 8.3 (H) 11/02/2022    Lab Results  Component Value Date   CREATININE 0.78 11/02/2022   BUN 15 11/02/2022   NA 141 11/02/2022   K 3.7 11/02/2022   CL 101 11/02/2022   CO2 27 11/02/2022    Lab  Results  Component Value Date   CHOL 158 11/02/2022   HDL 53.20 11/02/2022   LDLCALC 75 11/02/2022   TRIG 148.0 11/02/2022   CHOLHDL 3 11/02/2022    Medications Reviewed Today     Reviewed by [name], RPH-CPP (Pharmacist) on 12/05/22 at [phone]  Med List Status: <None>   Medication Order Taking? Sig Documenting Provider Last Dose Status Informant  acetaminophen (TYLENOL) 500 MG tablet [id number]  Take 500 mg by mouth every 6 (six)  hours as needed for mild pain. [provider]  Active Self  aspirin 81 MG tablet [id number] Yes Take 81 mg by mouth at bedtime.  [provider] Taking Active Self  atorvastatin (LIPITOR) 20 MG tablet [id number] Yes TAKE 1 TABLET(20 MG) BY MOUTH DAILY [name], Gwenlyn Found, MD Taking Active   b complex vitamins tablet [id number]  Take 1 tablet by mouth daily. [provider]  Active Self  Blood Glucose Monitoring Suppl (ONETOUCH VERIO) w/Device KIT [id number] Yes Use to check blood glucose once a day (Dx: E11.9 - type 2 DM) [name], Gwenlyn Found, MD Taking Active   Calcium-Phosphorus-Vitamin D (CITRACAL +D3 PO) [id number] Yes Take 2 tablets by mouth daily.  [provider] Taking Active Self  Cholecalciferol (VITAMIN D-3) 5000 UNITS TABS [id number] Yes Take 1 capsule by mouth daily.  [provider] Taking Active Self           Med Note Henderson Newcomer, DENISE P   Wed Jan 14, 2020  1:22 PM) .  dapagliflozin propanediol (FARXIGA) 10 MG TABS tablet [id number] Yes Take 1 tablet (10 mg total) by mouth daily before breakfast. [name], Gwenlyn Found, MD Taking Active   famotidine (PEPCID) 20 MG tablet [id number] Yes TAKE 1/2 TABLET(10 MG) BY MOUTH DAILY  Patient taking differently: Take 10 mg by mouth daily as needed.   [name], Gwenlyn Found, MD Taking Active   glucose blood (ONETOUCH VERIO) test strip [id number] Yes Use to check blood glucose once a day (Dx: E11.9 - type 2 DM) [name], Gwenlyn Found, MD Taking Active   Glycerin-Hypromellose-PEG 400 ([facility] DRY EYE RELIEF OP) [id number]  Apply 2 drops to eye as needed.  [provider]  Active Self  hydrochlorothiazide (MICROZIDE) 12.5 MG capsule [id number] No Take 1 capsule (12.5 mg total) by mouth as needed. Take with losartan/hctz to equal 25 of hctz total  Patient not taking: Reported on 11/16/2022   [name], Gwenlyn Found, MD Not Taking Active   Lancets [facility] ULTRASOFT) lancets [id number] Yes Use to check blood glucose once a day (Dx:  E11.9 - type 2 DM) [name], Gwenlyn Found, MD Taking Active   levothyroxine (SYNTHROID) 125 MCG tablet [id number] Yes Take 1 tablet (125 mcg total) by mouth daily before breakfast. [name], Gwenlyn Found, MD Taking Active   losartan-hydrochlorothiazide (HYZAAR) 50-12.5 MG tablet [id number] Yes TAKE 1 TABLET BY MOUTH DAILY [name], Gwenlyn Found, MD Taking Active   metFORMIN (GLUCOPHAGE) 500 MG tablet [id number] Yes Take 1 tablet (500 mg total) by mouth 2 (two) times daily with a meal. [name], Gwenlyn Found, MD Taking Active   methocarbamol (ROBAXIN) 500 MG tablet [id number]  Take 1 tablet by mouth every 8 (eight) hours as needed. [provider]  Active   trolamine salicylate (ASPERCREME) 10 % cream [id number]  Apply 1 application topically as needed for muscle pain. [provider]  Active Self             Assessment/Plan:   Diabetes: Currently not at A1c goal of < 7.5% but  last A1c was slightly improved - Continue metformin 500mg  twice a day with food and Farxiga 10mg  daily.  - Continue to be physically active. Goal is to get at least 150 minutes of exercise per week.  - Reviewed blood glucose goals with patinet - recommended she check 3 times per week or every other day. Fasting blood glucose goal (before meals) = 80 to 130 Blood glucose goal after a meal = less than 180  - Patient received applications for medication assistance program for both Jardiance / BI Cares and Farxiga / [place]. She reports her household income was a little above cut off (250% FPL) for BI Cares. She and her husband discussed applying for [place] for Marcelline Deist for she has decided not to complete applications. - Discussed 2025 changes to Medicare. Should have better coverage for Farxiga in 2025. Reminded patient to pay close attention to deductible when choosing 2025 Medicare plan. She is to call office if she has any questions.   Hypertension: Currently controlled per home blood pressure and last office blood  pressure   - continue to check blood pressure 3 times per week and record.  - Continue losartan hydrochlorothiazide 50/12.5mg  daily   Hyperlipidemia/ASCVD Risk Reduction:Currently controlled. LDL goal < 100 - Reviewed long term complications of uncontrolled cholesterol - Recommend to continue atorvastatin 20mg  daily   Follow Up Plan: - in 3 months to review medication adherence/ diabetes / hypertension.   [name], PharmD Clinical Pharmacist [facility]

## 2022-12-06 DIAGNOSIS — D23121 Other benign neoplasm of skin of left upper eyelid, including canthus: Secondary | ICD-10-CM | POA: Diagnosis not present

## 2022-12-08 NOTE — Telephone Encounter (Signed)
PAP: Cancelled by PATEINT TOLD  THE PHARMACIST TAMMY ECKARD SHE WAS NOT GOING TO DO THE PAP.    PLEASE BE ADVISED.

## 2022-12-27 DIAGNOSIS — E1159 Type 2 diabetes mellitus with other circulatory complications: Secondary | ICD-10-CM | POA: Diagnosis not present

## 2022-12-27 DIAGNOSIS — E7800 Pure hypercholesterolemia, unspecified: Secondary | ICD-10-CM | POA: Diagnosis not present

## 2022-12-27 DIAGNOSIS — F02B0 Dementia in other diseases classified elsewhere, moderate, without behavioral disturbance, psychotic disturbance, mood disturbance, and anxiety: Secondary | ICD-10-CM | POA: Diagnosis not present

## 2022-12-27 DIAGNOSIS — I10 Essential (primary) hypertension: Secondary | ICD-10-CM | POA: Diagnosis not present

## 2022-12-27 DIAGNOSIS — Z6834 Body mass index (BMI) 34.0-34.9, adult: Secondary | ICD-10-CM | POA: Diagnosis not present

## 2022-12-27 DIAGNOSIS — E039 Hypothyroidism, unspecified: Secondary | ICD-10-CM | POA: Diagnosis not present

## 2022-12-27 LAB — HEMOGLOBIN A1C: Hemoglobin A1C: 7

## 2022-12-28 ENCOUNTER — Other Ambulatory Visit: Payer: Self-pay | Admitting: Family Medicine

## 2022-12-28 DIAGNOSIS — F02B0 Dementia in other diseases classified elsewhere, moderate, without behavioral disturbance, psychotic disturbance, mood disturbance, and anxiety: Secondary | ICD-10-CM

## 2023-01-15 ENCOUNTER — Other Ambulatory Visit: Payer: Self-pay | Admitting: Family Medicine

## 2023-01-15 DIAGNOSIS — Z1231 Encounter for screening mammogram for malignant neoplasm of breast: Secondary | ICD-10-CM

## 2023-02-08 ENCOUNTER — Ambulatory Visit: Payer: Medicare Other

## 2023-02-09 ENCOUNTER — Other Ambulatory Visit: Payer: Medicare Other

## 2023-03-06 ENCOUNTER — Telehealth: Payer: Self-pay | Admitting: Pharmacist

## 2023-03-06 ENCOUNTER — Other Ambulatory Visit: Payer: Self-pay | Admitting: Pharmacist

## 2023-03-06 NOTE — Telephone Encounter (Signed)
Attempt was made to contact patient by phone today for follow up by Clinical Pharmacist regarding medication management / diabetes. However in the previsit review by Clinical Pharmacist Practitioner it looks like Anne [name] has seen Dr Romona Curls 12/27/2023 at [facility].  Called patient to confirm that she has changed PCP to Dr Rubye Oaks. .  Unable to reach patient. LM on VM with my contact number [phone] or main office number of [phone].

## 2023-04-24 ENCOUNTER — Other Ambulatory Visit: Payer: Self-pay

## 2023-04-24 ENCOUNTER — Ambulatory Visit
Admission: EM | Admit: 2023-04-24 | Discharge: 2023-04-24 | Disposition: A | Discharge status: Home / Self Care | Attending: Emergency Medicine | Admitting: Emergency Medicine

## 2023-04-24 ENCOUNTER — Encounter: Payer: Self-pay | Admitting: Emergency Medicine

## 2023-04-24 DIAGNOSIS — K649 Unspecified hemorrhoids: Secondary | ICD-10-CM | POA: Diagnosis not present

## 2023-04-24 MED ORDER — HYDROCORTISONE (PERIANAL) 2.5 % EX CREA: TOPICAL_CREAM | Freq: Two times a day (BID) | CUTANEOUS | 1 refills | Status: AC

## 2023-04-24 MED ORDER — HYDROCORTISONE (PERIANAL) 2.5 % EX CREA: TOPICAL_CREAM | Freq: Two times a day (BID) | CUTANEOUS | 1 refills | Status: DC

## 2023-04-24 NOTE — Discharge Instructions (Signed)
 Your evaluated for a hemorrhoid which is bleeding, no signs of tissue death or decreased blood flow hemorrhoid most likely irritated due to frequent bowel movements  Apply Anusol cream twice daily until all bleeding has resolved in size has been reduced  May stay in warm water in bathtub or basin, may help warm compresses to the affected area  If becoming painful may take Tylenol as needed  If your symptoms continue to persist please follow-up with your primary doctor for reevaluation

## 2023-04-24 NOTE — ED Provider Notes (Signed)
 [name]    CSN: [id number] Arrival date & time: 04/24/23  1558      History   Chief Complaint Chief Complaint  Patient presents with   Rectal Bleeding    HPI Anne [name] is a 83 y.o. female.   Patient presents for evaluation of rectal bleeding beginning 1 day ago.  Endorses approximately 3-4 days ago that she had vomiting and frequent soft bowel movements and diarrhea that occurred over a 24-hour period after eating a salad, symptoms have resolved.  Known hemorrhoid which she believes has become external.  Has not attempted treatment.  Past Medical History:  Diagnosis Date   Dental crowns status    01-14-2020 per pt had upper left front tooth abscess and has temporary crown from 2 wks ago,  pt stated abscess resolved and crown is not loose   Dyspnea    per pt gets sob at times with exertion   GERD (gastroesophageal reflux disease)    History of recent fall    per pt fell 01-08-2020 has bruised hip/ buttock / back area's and hit head ,  pt stated see provider, denies any dizziness, headache, or balance issues other than before   Hypertension    followed by pcp  (nulcear study in epic 03-06-2013 normwl perfusion w/ no ischemia, nuclear ef 74%)   Hypothyroidism, 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   followed by pcp---  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)   OA (osteoarthritis)    PMB (postmenopausal bleeding)    Thickened endometrium    Type 2 diabetes mellitus (HCC)    followed by pcp   Wears glasses     Patient Active Problem List   Diagnosis Date Noted   Diverticulitis 08/02/2020   Controlled type 2 diabetes mellitus without complication, without long-term current use of insulin (HCC) 03/31/2015   Chest pressure 03/03/2013   Globus sensation 03/03/2013   DOE (dyspnea on exertion) 03/03/2013   Thyroid cancer (HCC) 01/05/2012   Hyperlipidemia 01/05/2012   Dysphonia 01/05/2012   Osteoarthritis, hip, 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 Authorizing Provider  famotidine (PEPCID) 20 MG tablet TAKE 1/2 TABLET(10 MG) BY MOUTH DAILY Patient taking differently: Take 10 mg by mouth daily as needed. 07/01/18  Yes [name], Gwenlyn Found, MD  acetaminophen (TYLENOL) 500 MG tablet Take 500 mg by mouth every 6 (six) hours as needed for mild pain.    [provider]  aspirin 81 MG tablet Take 81 mg by mouth at bedtime.     [provider]  atorvastatin (LIPITOR) 20 MG tablet TAKE 1 TABLET(20 MG) BY MOUTH DAILY  09/19/22   [name], Gwenlyn Found, MD  b complex vitamins tablet Take 1 tablet by mouth daily.    [provider]  Blood Glucose Monitoring Suppl (ONETOUCH VERIO) w/Device KIT Use to check blood glucose once a day (Dx: E11.9 - type 2 DM) 10/13/22   [name], Gwenlyn Found, MD  Calcium-Phosphorus-Vitamin D (CITRACAL +D3 PO) Take 2 tablets by mouth daily.     [provider]  Cholecalciferol (VITAMIN D-3) 5000 UNITS TABS Take 1 capsule by mouth daily.     [provider]  dapagliflozin propanediol (FARXIGA) 10 MG TABS tablet Take 1 tablet (10 mg total) by mouth daily before breakfast. 11/15/22   [name], Gwenlyn Found, MD  glucose blood (ONETOUCH VERIO) test strip Use to check blood glucose once a day (Dx: E11.9 - type 2 DM) 10/13/22   [name], Gwenlyn Found, MD  Glycerin-Hypromellose-PEG 400 ([facility] DRY EYE RELIEF OP) Apply 2 drops to eye as needed.      [provider]  hydrochlorothiazide (MICROZIDE) 12.5 MG capsule Take 1 capsule (12.5 mg total) by mouth as needed. Take with losartan/hctz to equal 25 of hctz total Patient not taking: Reported on 11/16/2022 08/24/21   [name], Gwenlyn Found, MD  hydrocortisone (ANUSOL-HC) 2.5 % rectal cream Place rectally 2 (two) times daily. 04/24/23   Valinda Hoar, NP  Lancets (ONETOUCH ULTRASOFT) lancets Use to check blood glucose once a day (Dx: E11.9 - type 2 DM) 10/13/22   [name], Gwenlyn Found, MD  levothyroxine (SYNTHROID) 125 MCG tablet Take 1 tablet (125 mcg total) by mouth daily before breakfast. 03/01/22   [name], Gwenlyn Found, MD  losartan-hydrochlorothiazide (HYZAAR) 50-12.5 MG tablet TAKE 1 TABLET BY MOUTH DAILY 07/17/22   [name], Gwenlyn Found, MD  metFORMIN (GLUCOPHAGE) 500 MG tablet Take 1 tablet (500 mg total) by mouth 2 (two) times daily with a meal. 11/02/22   [name], Gwenlyn Found, MD  methocarbamol (ROBAXIN) 500 MG tablet Take 1 tablet by mouth every 8 (eight) hours as needed.    [provider]  trolamine salicylate (ASPERCREME) 10 % cream Apply 1 application topically as needed for muscle pain.    [provider]    Family History Family History  Problem Relation Age of Onset   Stroke Mother    Cancer Father    Cancer Sister    Cancer Sister    Cancer Sister    Miscarriages / Stillbirths Neg Hx    Breast cancer Neg Hx     Social History Social History   Tobacco Use   Smoking status: Never   Smokeless tobacco: Never  Vaping Use   Vaping status: Never Used  Substance Use Topics   Alcohol use: No   Drug use: Never     Allergies   Amoxicillin   Review of Systems Review of Systems   Physical Exam Triage Vital Signs ED Triage Vitals [04/24/23 1617]  Encounter Vitals Group     BP (!) 148/89     Systolic BP Percentile      Diastolic BP Percentile      Pulse Rate 89     Resp 18     Temp 98.9 F (37.2 C)     Temp Source Temporal     SpO2 98 %      Weight      Height      Head Circumference      Peak Flow      Pain Score 0     Pain Loc  Pain Education      Exclude from Growth Chart    No data found.  Updated Vital Signs BP (!) 148/89 (BP Location: Left Arm)   Pulse 89   Temp 98.9 F (37.2 C) (Temporal)   Resp 18   SpO2 98%   Visual Acuity Right Eye Distance:   Left Eye Distance:   Bilateral Distance:    Right Eye Near:   Left Eye Near:    Bilateral Near:     Physical Exam Constitutional:      Appearance: Normal appearance.  Eyes:     Extraocular Movements: Extraocular movements intact.  Pulmonary:     Effort: Pulmonary effort is normal.  Genitourinary:    Comments: Bleeding external hemorrhoid Neurological:     Mental Status: She is alert.      UC Treatments / Results  Labs (all labs ordered are listed, but only abnormal results are displayed) Labs Reviewed - No data to display  EKG   Radiology No results found.  Procedures Procedures (including critical care time)  Medications Ordered in UC Medications - No data to display  Initial Impression / Assessment and Plan / UC Course  I have reviewed the triage vital signs and the nursing notes.  Pertinent labs & imaging results that were available during my care of the patient were reviewed by me and considered in my medical decision making (see chart for details).  Bleeding hemorrhoid  No signs of incarceration, discussed, most likely irritated due to frequent bowel movements, discussed, diarrhea has subsided, prescribed Anusol cream recommended supportive care through over-the-counter analgesics and heat, advise follow-up with PCP for any persisting symptoms Final Clinical Impressions(s) / UC Diagnoses   Final diagnoses:  Bleeding hemorrhoid     Discharge Instructions      Your evaluated for a hemorrhoid which is bleeding, no signs of tissue death or decreased blood flow hemorrhoid most likely irritated due to frequent bowel  movements  Apply Anusol cream twice daily until all bleeding has resolved in size has been reduced  May stay in warm water in bathtub or basin, may help warm compresses to the affected area  If becoming painful may take Tylenol as needed  If your symptoms continue to persist please follow-up with your primary doctor for reevaluation     ED Prescriptions     Medication Sig Dispense Auth. Provider   hydrocortisone (ANUSOL-HC) 2.5 % rectal cream  (Status: Discontinued) Place rectally 2 (two) times daily. 30 g Daxon Kyne R, NP   hydrocortisone (ANUSOL-HC) 2.5 % rectal cream Place rectally 2 (two) times daily. 30 g Valinda Hoar, NP      PDMP not reviewed this encounter.   Valinda Hoar, [place] 04/24/23 [phone]

## 2023-04-24 NOTE — ED Triage Notes (Signed)
 Patient presents to [facility] for evaluation rectal bleeding when she wiped after a BM yesterday.  Today saw blood in the stool  Hx of hemorrhoids.  Reflux and bile the night before everything started.

## 2023-04-26 ENCOUNTER — Telehealth: Payer: Self-pay | Admitting: Family Medicine

## 2023-04-26 NOTE — Telephone Encounter (Signed)
 Copied from CRM [id number]. Topic: Medicare AWV >> Apr 26, 2023 10:36 AM Payton Doughty wrote: Reason for CRM: Called LVM 04/26/2023 to schedule AWV. Please schedule office or virtual visits  Verlee Rossetti; Care Guide Ambulatory Clinical Support [facility] Direct Dial: [phone]

## 2023-05-18 ENCOUNTER — Ambulatory Visit
Admission: EM | Admit: 2023-05-18 | Discharge: 2023-05-18 | Disposition: A | Discharge status: Home / Self Care | Attending: Emergency Medicine | Admitting: Emergency Medicine

## 2023-05-18 ENCOUNTER — Other Ambulatory Visit: Payer: Self-pay

## 2023-05-18 ENCOUNTER — Emergency Department
Admission: EM | Admit: 2023-05-18 | Discharge: 2023-05-18 | Disposition: A | Discharge status: Home / Self Care | Attending: Student in an Organized Health Care Education/Training Program | Admitting: Student in an Organized Health Care Education/Training Program

## 2023-05-18 ENCOUNTER — Emergency Department

## 2023-05-18 DIAGNOSIS — E119 Type 2 diabetes mellitus without complications: Secondary | ICD-10-CM | POA: Insufficient documentation

## 2023-05-18 DIAGNOSIS — R112 Nausea with vomiting, unspecified: Secondary | ICD-10-CM | POA: Diagnosis not present

## 2023-05-18 DIAGNOSIS — R1013 Epigastric pain: Secondary | ICD-10-CM

## 2023-05-18 DIAGNOSIS — N3000 Acute cystitis without hematuria: Secondary | ICD-10-CM | POA: Diagnosis not present

## 2023-05-18 DIAGNOSIS — R519 Headache, unspecified: Secondary | ICD-10-CM | POA: Diagnosis not present

## 2023-05-18 DIAGNOSIS — R42 Dizziness and giddiness: Secondary | ICD-10-CM | POA: Diagnosis not present

## 2023-05-18 LAB — CBC
HCT: 41.9 % (ref 36.0–46.0)
Hemoglobin: 14.2 g/dL (ref 12.0–15.0)
MCH: 31 pg (ref 26.0–34.0)
MCHC: 33.9 g/dL (ref 30.0–36.0)
MCV: 91.5 fL (ref 80.0–100.0)
Platelets: 231 10*3/uL (ref 150–400)
RBC: 4.58 MIL/uL (ref 3.87–5.11)
RDW: 13.1 % (ref 11.5–15.5)
WBC: 5 10*3/uL (ref 4.0–10.5)
nRBC: 0 % (ref 0.0–0.2)

## 2023-05-18 LAB — BASIC METABOLIC PANEL WITH GFR
Anion gap: 11 (ref 5–15)
BUN: 14 mg/dL (ref 8–23)
CO2: 27 mmol/L (ref 22–32)
Calcium: 9.8 mg/dL (ref 8.9–10.3)
Chloride: 102 mmol/L (ref 98–111)
Creatinine, Ser: 0.89 mg/dL (ref 0.44–1.00)
GFR, Estimated: >60 mL/min (ref >60)
Glucose, Bld: 108 mg/dL — ABNORMAL HIGH (ref 70–99)
Potassium: 4 mmol/L (ref 3.5–5.1)
Sodium: 140 mmol/L (ref 135–145)

## 2023-05-18 LAB — URINALYSIS, ROUTINE W REFLEX MICROSCOPIC
Bilirubin Urine: NEGATIVE
Glucose, UA: >=500 mg/dL — AB
Hgb urine dipstick: NEGATIVE
Ketones, ur: NEGATIVE mg/dL
Nitrite: POSITIVE — AB
Protein, ur: NEGATIVE mg/dL
Specific Gravity, Urine: 1.01 (ref 1.005–1.030)
pH: 5 (ref 5.0–8.0)

## 2023-05-18 LAB — TROPONIN I (HIGH SENSITIVITY)
Troponin I (High Sensitivity): 5 ng/L (ref <18)
Troponin I (High Sensitivity): 6 ng/L (ref <18)

## 2023-05-18 LAB — HEPATIC FUNCTION PANEL
ALT: 19 U/L (ref 0–44)
AST: 23 U/L (ref 15–41)
Albumin: 4.3 g/dL (ref 3.5–5.0)
Alkaline Phosphatase: 63 U/L (ref 38–126)
Bilirubin, Direct: <0.1 mg/dL (ref 0.0–0.2)
Total Bilirubin: 0.9 mg/dL (ref 0.0–1.2)
Total Protein: 7.6 g/dL (ref 6.5–8.1)

## 2023-05-18 LAB — POC SARS CORONAVIRUS 2 AG -  ED: SARS Coronavirus 2 Ag: NEGATIVE

## 2023-05-18 LAB — LIPASE, BLOOD: Lipase: 29 U/L (ref 11–51)

## 2023-05-18 MED ORDER — CEPHALEXIN 500 MG PO CAPS
500.0000 mg | ORAL_CAPSULE | Freq: Once | ORAL | Status: AC
  Administered 2023-05-18: 500 mg via ORAL
  Filled 2023-05-18: qty 1

## 2023-05-18 MED ORDER — CEPHALEXIN 500 MG PO CAPS: 500.0000 mg | ORAL_CAPSULE | Freq: Two times a day (BID) | ORAL | 0 refills | Status: AC

## 2023-05-18 NOTE — Discharge Instructions (Signed)
 Go to the emergency department for evaluation of your dizziness and abdominal pain.

## 2023-05-18 NOTE — ED Provider Notes (Signed)
 [facility] Provider Note    Event Date/Time   First MD Initiated Contact with Patient 05/18/23 1714     (approximate)   History   Dizziness   HPI  Anne [name] is a 83 y.o. female with a history of diabetes presents to the ER for evaluation of what she describes as dizziness and lightheadedness associate with nausea vomiting over the past few days.  Has had some mild headaches.  Not worst headache of her life.  Denies any numbness or tingling.  No blurred vision.  States that she gets the dizziness and lightheadedness when she stands up quickly.  States that she did not have much to eat this morning.  She denies any abdominal pain at this time.  Was seen at urgent care and she was worried that she had the flu or COVID.  Given her age and symptoms was sent to the ER for further evaluation.  She denies any chest pain or pressure.         Physical Exam   Triage Vital Signs: ED Triage Vitals  Encounter Vitals Group     BP 05/18/23 1450 (!) 184/73     Systolic BP Percentile --      Diastolic BP Percentile --      Pulse Rate 05/18/23 1450 76     Resp 05/18/23 1450 20     Temp 05/18/23 1450 98 F (36.7 C)     Temp Source 05/18/23 1450 Oral     SpO2 05/18/23 1450 98 %     Weight --      Height --      Head Circumference --      Peak Flow --      Pain Score 05/18/23 1448 4     Pain Loc --      Pain Education --      Exclude from Growth Chart --     Most recent vital signs: Vitals:   05/18/23 1730 05/18/23 1815  BP: (!) 148/62 (!) 178/69  Pulse: 85 98  Resp: 17 16  Temp:    SpO2: 98% 98%     Constitutional: Alert  Eyes: Conjunctivae are normal.  Head: Atraumatic. Nose: No congestion/rhinnorhea. Mouth/Throat: Mucous membranes are moist.   Neck: Painless ROM.  Cardiovascular:   Good peripheral circulation. Respiratory: Normal respiratory effort.  No retractions.  Gastrointestinal: Soft and nontender.  Musculoskeletal:  no  deformity Neurologic:  CN- intact.  No facial droop, Normal FNF.  Normal heel to shin.  Sensation intact bilaterally. Normal speech and language. No gross focal neurologic deficits are appreciated. No gait instability. MAE spontaneously. No gross focal neurologic deficits are appreciated.  Skin:  Skin is warm, dry and intact. No rash noted. Psychiatric: Mood and affect are normal. Speech and behavior are normal.    ED Results / Procedures / Treatments   Labs (all labs ordered are listed, but only abnormal results are displayed) Labs Reviewed  BASIC METABOLIC PANEL WITH GFR - Abnormal; Notable for the following components:      Result Value   Glucose, Bld 108 (*)    All other components within normal limits  URINALYSIS, ROUTINE W REFLEX MICROSCOPIC - Abnormal; Notable for the following components:   Color, Urine YELLOW (*)    APPearance HAZY (*)    Glucose, UA >=500 (*)    Nitrite POSITIVE (*)    Leukocytes,Ua TRACE (*)    Bacteria, UA MANY (*)    All other components  within normal limits  RESP PANEL BY RT-PCR (RSV, FLU A&B, COVID)  RVPGX2  CBC  HEPATIC FUNCTION PANEL  LIPASE, BLOOD  TROPONIN I (HIGH SENSITIVITY)  TROPONIN I (HIGH SENSITIVITY)     EKG 1 ED ECG REPORT I, Willy Eddy, the attending physician, personally viewed and interpreted this ECG.   Date: 05/18/2023  EKG Time: 14:54  Rate: 75  Rhythm: sinus  Axis: normal  Intervals: normal  ST&T Change: no stemi, no depressions    RADIOLOGY Please see ED Course for my review and interpretation.  I personally reviewed all radiographic images ordered to evaluate for the above acute complaints and reviewed radiology reports and findings.  These findings were personally discussed with the patient.  Please see medical record for radiology report.    PROCEDURES:  Critical Care performed: No  Procedures   MEDICATIONS ORDERED IN ED: Medications  cephALEXin (KEFLEX) capsule 500 mg (500 mg Oral Given  05/18/23 1914)     IMPRESSION / MDM / ASSESSMENT AND PLAN / ED COURSE  I reviewed the triage vital signs and the nursing notes.                              Differential diagnosis includes, but is not limited to, dehdyration, electrolyte abn, colitis, diverticulitis, viral illness  Patient presenting to the ER for evaluation of symptoms as described above.  Based on symptoms, risk factors and considered above differential, this presenting complaint could reflect a potentially life-threatening illness therefore the patient will be placed on continuous pulse oximetry and telemetry for monitoring.  Laboratory evaluation will be sent to evaluate for the above complaints.      Clinical Course as of 05/18/23 2011  Fri May 18, 2023  1908 Patient findings suggestive of UTI.  Will give antibiotic. [PR]    Clinical Course User Index [PR] Willy Eddy, MD   Patient reassessed.  Remains well-appearing in no acute distress.  Given her otherwise reassuring workup and exam I think that it is reasonable to treat with antibiotics for UTI.  Patient does appear stable and appropriate for outpatient follow-up.  FINAL CLINICAL IMPRESSION(S) / ED DIAGNOSES   Final diagnoses:  Dizziness  Acute cystitis without hematuria     Rx / DC Orders   ED Discharge Orders     None        Note:  This document was prepared using Dragon voice recognition software and may include unintentional dictation errors.    Willy Eddy, MD 05/18/23 2011

## 2023-05-18 NOTE — ED Notes (Signed)
 Troponin added to initial blood collect. Lab called

## 2023-05-18 NOTE — ED Provider Notes (Signed)
 Anne [name]    CSN: [id number] Arrival date & time: 05/18/23  1322      History   Chief Complaint Chief Complaint  Patient presents with   Headache   Nausea    HPI Anne [name] is a 83 y.o. female.  Accompanied by her husband, patient presents with episodes of dizziness, headaches, epigastric pain, nausea and vomiting, reflux x 3 days.  She describes the dizziness as spinning; last episode occurred this morning.  No vomiting in the last 24 hours.  She was able to eat breakfast this morning without vomiting.  She denies fever, chest pain, shortness of breath.  2 bowel movements yesterday.  No OTC medication taken today.  She took Pepto-Bismol 2 days ago but vomited right after.  The history is provided by the patient, the spouse and medical records.    Past Medical History:  Diagnosis Date   Dental crowns status    01-14-2020 per pt had upper left front tooth abscess and has temporary crown from 2 wks ago,  pt stated abscess resolved and crown is not loose   Dyspnea    per pt gets sob at times with exertion   GERD (gastroesophageal reflux disease)    History of recent fall    per pt fell 01-08-2020 has bruised hip/ buttock / back area's and hit head ,  pt stated see provider, denies any dizziness, headache, or balance issues other than before   Hypertension    followed by pcp  (nulcear study in epic 03-06-2013 normwl perfusion w/ no ischemia, nuclear ef 74%)   Hypothyroidism, 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   followed by pcp---  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)   OA (osteoarthritis)    PMB (postmenopausal bleeding)    Thickened endometrium    Type 2 diabetes mellitus (HCC)    followed by pcp   Wears glasses     Patient Active Problem List   Diagnosis Date Noted   Diverticulitis 08/02/2020   Controlled type 2 diabetes mellitus without complication, without long-term current use of insulin (HCC) 03/31/2015   Chest pressure 03/03/2013   Globus  sensation 03/03/2013   DOE (dyspnea on exertion) 03/03/2013   Thyroid cancer (HCC) 01/05/2012   Hyperlipidemia 01/05/2012   Dysphonia 01/05/2012   Osteoarthritis, hip, 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 Authorizing Provider  acetaminophen (TYLENOL) 500 MG tablet Take 500 mg by mouth every 6 (six) hours as needed for mild pain.    [provider]  aspirin 81 MG tablet Take 81 mg by mouth at bedtime.     [provider]  atorvastatin (LIPITOR) 20 MG tablet TAKE 1 TABLET(20 MG) BY MOUTH DAILY 09/19/22   [name], Gwenlyn Found, MD  b complex vitamins tablet Take 1 tablet by mouth daily.    [provider]  Blood Glucose Monitoring Suppl (ONETOUCH VERIO) w/Device KIT Use to check blood glucose once a day (Dx: E11.9 - type 2 DM) 10/13/22   [name], Gwenlyn Found, MD  Calcium-Phosphorus-Vitamin D (CITRACAL +D3 PO) Take 2 tablets by mouth daily.     [provider]  Cholecalciferol (VITAMIN D-3) 5000 UNITS TABS Take 1 capsule by mouth daily.     [provider]  dapagliflozin propanediol (FARXIGA) 10 MG TABS tablet Take 1 tablet (10 mg total) by mouth daily before breakfast. 11/15/22   [name], Gwenlyn Found, MD  famotidine (PEPCID) 20 MG tablet TAKE 1/2 TABLET(10 MG) BY MOUTH DAILY Patient taking differently: Take 10 mg by mouth daily as needed. 07/01/18   [name], Gwenlyn Found, MD  glucose  blood (ONETOUCH VERIO) test strip Use to check blood glucose once a day (Dx: E11.9 - type 2 DM) 10/13/22   [name], Gwenlyn Found, MD  Glycerin-Hypromellose-PEG 400 ([facility] DRY EYE RELIEF OP) Apply 2 drops to eye as needed.     [provider]  hydrochlorothiazide (MICROZIDE) 12.5 MG capsule Take 1 capsule (12.5 mg total) by mouth as needed. Take with losartan/hctz to equal 25 of hctz total Patient not taking: Reported on 11/16/2022 08/24/21   [name], Gwenlyn Found, MD  hydrocortisone (ANUSOL-HC) 2.5 % rectal cream Place rectally 2 (two) times daily. 04/24/23   Valinda Hoar, NP  Lancets (ONETOUCH ULTRASOFT) lancets Use to check blood glucose once a day (Dx: E11.9 - type 2 DM) 10/13/22   [name], Gwenlyn Found, MD  levothyroxine (SYNTHROID) 125 MCG tablet Take 1 tablet (125 mcg total) by mouth daily before breakfast. 03/01/22   [name], Gwenlyn Found, MD  losartan-hydrochlorothiazide (HYZAAR) 50-12.5 MG tablet TAKE 1 TABLET BY MOUTH DAILY 07/17/22   [name], Gwenlyn Found, MD  metFORMIN (GLUCOPHAGE) 500 MG tablet Take 1 tablet (500 mg total) by mouth 2 (two) times daily with a meal. 11/02/22   [name], Gwenlyn Found, MD  methocarbamol (ROBAXIN) 500 MG tablet Take 1 tablet by mouth every 8 (eight) hours as needed.    [provider]  trolamine salicylate (ASPERCREME) 10 % cream Apply 1 application topically as needed for muscle pain.    [provider]    Family History Family History  Problem Relation Age of Onset   Stroke Mother    Cancer Father    Cancer Sister    Cancer Sister    Cancer Sister    Miscarriages / Stillbirths Neg Hx    Breast cancer Neg Hx     Social History Social History   Tobacco Use   Smoking status: Never   Smokeless tobacco: Never  Vaping Use   Vaping status: Never Used  Substance Use Topics   Alcohol use: No   Drug use: Never     Allergies   Amoxicillin   Review of Systems Review of Systems  Constitutional:  Negative for chills and fever.   Respiratory:  Negative for cough and shortness of breath.   Cardiovascular:  Negative for chest pain and palpitations.  Gastrointestinal:  Positive for abdominal pain, nausea and vomiting. Negative for blood in stool, constipation and diarrhea.  Neurological:  Positive for dizziness and headaches. Negative for syncope, weakness and numbness.     Physical Exam Triage Vital Signs ED Triage Vitals  Encounter Vitals Group     BP      Systolic BP Percentile      Diastolic BP Percentile      Pulse      Resp      Temp      Temp src      SpO2      Weight      Height      Head Circumference      Peak Flow      Pain Score      Pain Loc      Pain Education      Exclude from Growth Chart    No data found.  Updated Vital Signs BP 138/75   Pulse 81   Temp 97.7 F (36.5 C)   Resp 18   SpO2 97%   Visual Acuity Right Eye Distance:   Left Eye Distance:   Bilateral Distance:    Right Eye Near:   Left Eye Near:    Bilateral Near:     Physical Exam Constitutional:      General: She is not in acute distress. HENT:     Mouth/Throat:     Mouth: Mucous membranes are moist.  Cardiovascular:     Rate and Rhythm: Normal rate and regular rhythm.     Heart sounds: Normal heart sounds.  Pulmonary:     Effort: Pulmonary effort is normal. No respiratory distress.     Breath sounds: Normal breath sounds.  Abdominal:     General: Bowel sounds are normal.     Palpations: Abdomen is soft.     Tenderness: There is no abdominal tenderness. There is no guarding or rebound.  Neurological:     General: No focal deficit present.     Mental Status: She is alert and oriented to person, place, and time.     Sensory: No sensory deficit.     Motor: No weakness.     Gait: Gait normal.      UC Treatments / Results  Labs (all labs ordered are listed, but only abnormal results are displayed) Labs Reviewed  POC SARS CORONAVIRUS 2 AG -  ED    EKG   Radiology No results  found.  Procedures Procedures (including critical care time)  Medications Ordered in UC Medications - No data to display  Initial Impression / Assessment and Plan / UC Course  I have reviewed the triage vital signs and the nursing notes.  Pertinent labs & imaging results that were available during my care of the patient were reviewed by me and considered in my medical decision making (see chart for details).    Dizziness, epigastric pain, nausea and vomiting.  Afebrile and vital signs are stable.  Exam is reassuring.  Discussed limitations of evaluation of her symptoms in an urgent care setting.  Discussed treatment options with patient and her husband.  They decline additional work up here, including EKG.  They prefer to go to [facility] ED for evaluation.  They decline EMS.'s husband will drive her to the ED.  Final Clinical Impressions(s) / UC Diagnoses   Final diagnoses:  Dizziness  Epigastric pain  Nausea and vomiting, unspecified vomiting type     Discharge Instructions      Go to the emergency department for evaluation of your dizziness and abdominal pain.     ED Prescriptions   None    PDMP not reviewed this encounter.   Mickie Bail, NP 05/18/23 [phone]

## 2023-05-18 NOTE — ED Notes (Signed)
 Patient is being discharged from the Urgent Care and sent to the Emergency Department via POV . Per Wendee Beavers patient is in need of higher level of care due to dizziness/ epigastric pain/ nausea ad vomiting. Patient is aware and verbalizes understanding of plan of care.  Vitals:   05/18/23 1334 05/18/23 1346  BP:  138/75  Pulse: 81   Resp: 18   Temp: 97.7 F (36.5 C)   SpO2: 97%

## 2023-05-18 NOTE — ED Triage Notes (Signed)
 Patient to Urgent Care with complaints of stomach aches/ headaches, feeling "swimmy headed"/ nausea.   Symptoms started Tuesday night. Reports feeling nauseated when she laid down and vomited.   Taking pepto/ prune juice. Eating soup and toast.   Concerned about Covid.

## 2023-05-18 NOTE — ED Triage Notes (Signed)
 Pt to ED via POV from [facility]. Pt reports abd pain, HA, dizziness and N/V that started Tuesday. Pt reports symptoms have been persistent.

## 2023-05-19 LAB — RESP PANEL BY RT-PCR (RSV, FLU A&B, COVID)  RVPGX2
Influenza A by PCR: NEGATIVE
Influenza B by PCR: NEGATIVE
Resp Syncytial Virus by PCR: NEGATIVE
SARS Coronavirus 2 by RT PCR: NEGATIVE

## 2023-05-31 DIAGNOSIS — I10 Essential (primary) hypertension: Secondary | ICD-10-CM | POA: Insufficient documentation

## 2023-06-04 ENCOUNTER — Ambulatory Visit: Payer: Self-pay | Admitting: General Practice

## 2023-06-04 DIAGNOSIS — E119 Type 2 diabetes mellitus without complications: Secondary | ICD-10-CM

## 2023-06-04 DIAGNOSIS — Z7689 Persons encountering health services in other specified circumstances: Secondary | ICD-10-CM

## 2023-06-04 DIAGNOSIS — I10 Essential (primary) hypertension: Secondary | ICD-10-CM

## 2023-06-07 ENCOUNTER — Encounter: Payer: Self-pay | Admitting: General Practice

## 2023-06-07 ENCOUNTER — Ambulatory Visit (INDEPENDENT_AMBULATORY_CARE_PROVIDER_SITE_OTHER): Admitting: General Practice

## 2023-06-07 VITALS — BP 142/64 | HR 77 | Temp 97.7°F | Ht 65.0 in | Wt 201.0 lb

## 2023-06-07 DIAGNOSIS — I10 Essential (primary) hypertension: Secondary | ICD-10-CM | POA: Diagnosis not present

## 2023-06-07 DIAGNOSIS — E782 Mixed hyperlipidemia: Secondary | ICD-10-CM

## 2023-06-07 DIAGNOSIS — E890 Postprocedural hypothyroidism: Secondary | ICD-10-CM | POA: Diagnosis not present

## 2023-06-07 DIAGNOSIS — E119 Type 2 diabetes mellitus without complications: Secondary | ICD-10-CM

## 2023-06-07 DIAGNOSIS — Z7984 Long term (current) use of oral hypoglycemic drugs: Secondary | ICD-10-CM

## 2023-06-07 MED ORDER — DAPAGLIFLOZIN PROPANEDIOL 10 MG PO TABS: 10.0000 mg | ORAL_TABLET | Freq: Every day | ORAL | 3 refills | Status: AC

## 2023-06-07 MED ORDER — METFORMIN HCL 1000 MG PO TABS: 1000.0000 mg | ORAL_TABLET | Freq: Two times a day (BID) | ORAL | 0 refills | Status: AC

## 2023-06-07 MED ORDER — ATORVASTATIN CALCIUM 20 MG PO TABS: ORAL_TABLET | ORAL | 0 refills | Status: AC

## 2023-06-07 NOTE — Assessment & Plan Note (Signed)
 Slightly elevated on both readings.  Discussed medication compliance.  Continue losartan -hydrochlorothiazide  once daily.  Exam stable.

## 2023-06-07 NOTE — Assessment & Plan Note (Signed)
 Controlled. Last A1c was in November, 2024; reviewed.  Urine ACR due- will complete at next visit.  Will repeat A1c at next visit.  Foot exam UTD.   Follow up in 4 weeks. Continue metformin  and farxiga . Refills sent.

## 2023-06-07 NOTE — Patient Instructions (Signed)
 Refills have been sent.   Schedule follow up in 4-5 weeks for diabetes.  We will check your urine that day as well your hemoglobin A1c.   It was a pleasure meeting you!

## 2023-06-07 NOTE — Progress Notes (Signed)
 New Patient Office Visit  Subjective    Patient ID: Anne [name], female    DOB: Nov 29, 1940  Age: 83 y.o. MRN: [id number]  CC:  Chief Complaint  Patient presents with   New Patient (Initial Visit)    HPI Anne [name] is a 83 y.o. female presents to establish care.   Her husband, Anne [name], is also present today,   TOC from Dr. Camilo Cella [name].   DM 2- diagnosed several years ago. Current medications include: Farxiga  10 mg once daily and metformin  1000 mg two times a day.   She is checking her blood glucose three times a week in the morning and is getting readings of are always in the green zone. Less than 120.   Last A1C: 7.0 Last Eye Exam: 06/06/22; she is scheduled for August.  Last Foot Exam: UTD. Pneumonia Vaccination: UTD  Urine Microalbumin: January, 2024 Statin: Atorvastatin   HTN: diagnosed several years ago. Currently managed on losartan -hydrochlorothiazide  50-12.5 mg once daily. She also has hydrochlorothiazide  12.5 mg as needed. The home BP readings have been in the mid 130s-140s's / 70's-80s range. She denies any headaches, chest pain, shortness of breath or difficulty breathing.   Hypothyroidism: diagnosed several years ago. Currently managed on Levothyroxine  125 mcg   Hyperlipidemia: diagnosed several years ago. Currently managed on Atorvastatin  20 mg once daily.   Outpatient Encounter Medications as of 06/07/2023  Medication Sig   acetaminophen  (TYLENOL ) 500 MG tablet Take 500 mg by mouth every 6 (six) hours as needed for mild pain.   aspirin 81 MG tablet Take 81 mg by mouth at bedtime.    b complex vitamins tablet Take 1 tablet by mouth daily.   Blood Glucose Monitoring Suppl (ONETOUCH VERIO) w/Device KIT Use to check blood glucose once a day (Dx: E11.9 - type 2 DM)   Calcium -Phosphorus-Vitamin D  (CITRACAL +D3 PO) Take 2 tablets by mouth daily.    Cholecalciferol (VITAMIN D -3) 5000 UNITS TABS Take 1 capsule by mouth daily.    famotidine  (PEPCID ) 20 MG  tablet TAKE 1/2 TABLET(10 MG) BY MOUTH DAILY (Patient taking differently: Take 10 mg by mouth daily as needed.)   glucose blood (ONETOUCH VERIO) test strip Use to check blood glucose once a day (Dx: E11.9 - type 2 DM)   Glycerin-Hypromellose-PEG 400 ([facility] DRY EYE RELIEF OP) Apply 2 drops to eye as needed.    hydrochlorothiazide  (MICROZIDE ) 12.5 MG capsule Take 1 capsule (12.5 mg total) by mouth as needed. Take with losartan /hctz to equal 25 of hctz total   hydrocortisone  (ANUSOL -HC) 2.5 % rectal cream Place rectally 2 (two) times daily.   Lancets (ONETOUCH ULTRASOFT) lancets Use to check blood glucose once a day (Dx: E11.9 - type 2 DM)   levothyroxine  (SYNTHROID ) 125 MCG tablet Take 1 tablet (125 mcg total) by mouth daily before breakfast.   losartan -hydrochlorothiazide  (HYZAAR) 50-12.5 MG tablet TAKE 1 TABLET BY MOUTH DAILY   methocarbamol (ROBAXIN) 500 MG tablet Take 1 tablet by mouth every 8 (eight) hours as needed.   trolamine salicylate (ASPERCREME) 10 % cream Apply 1 application topically as needed for muscle pain.   [DISCONTINUED] atorvastatin  (LIPITOR) 20 MG tablet TAKE 1 TABLET(20 MG) BY MOUTH DAILY   [DISCONTINUED] dapagliflozin  propanediol (FARXIGA ) 10 MG TABS tablet Take 1 tablet (10 mg total) by mouth daily before breakfast.   [DISCONTINUED] metFORMIN  (GLUCOPHAGE ) 1000 MG tablet Take 1,000 mg by mouth 2 (two) times daily with a meal.   atorvastatin  (LIPITOR) 20 MG tablet TAKE 1 TABLET(20 MG)  BY MOUTH DAILY   dapagliflozin  propanediol (FARXIGA ) 10 MG TABS tablet Take 1 tablet (10 mg total) by mouth daily before breakfast.   metFORMIN  (GLUCOPHAGE ) 1000 MG tablet Take 1 tablet (1,000 mg total) by mouth 2 (two) times daily with a meal.   [DISCONTINUED] metFORMIN  (GLUCOPHAGE ) 500 MG tablet Take 1 tablet (500 mg total) by mouth 2 (two) times daily with a meal.   No facility-administered encounter medications on file as of 06/07/2023.    Past Medical History:  Diagnosis Date   Dental  crowns status    01-14-2020 per pt had upper left front tooth abscess and has temporary crown from 2 wks ago,  pt stated abscess resolved and crown is not loose   Dyspnea    per pt gets sob at times with exertion   GERD (gastroesophageal reflux disease)    History of recent fall    per pt fell 01-08-2020 has bruised hip/ buttock / back area's and hit head ,  pt stated see provider, denies any dizziness, headache, or balance issues other than before   Hypertension    followed by pcp  (nulcear study in epic 03-06-2013 norm[facility] perfusion w/ no ischemia, nuclear ef 74%)   Hypothyroidism, 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   followed by pcp---  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)   OA (osteoarthritis)    PMB (postmenopausal bleeding)    Thickened endometrium    Type 2 diabetes mellitus (HCC)    followed by pcp   Wears glasses     Past Surgical History:  Procedure Laterality Date   CATARACT EXTRACTION W/ INTRAOCULAR LENS  IMPLANT, BILATERAL  2008   THYROIDECTOMY  12/25/2009   TONSILLECTOMY AND ADENOIDECTOMY  age 53   TOTAL HIP ARTHROPLASTY Bilateral left 10/07/2007;  right 06-03-2008  @[facility]     Family History  Problem Relation Age of Onset   Stroke Mother    Cancer Father    Cancer Sister    Cancer Sister    Cancer Sister    Miscarriages / Stillbirths Neg Hx    Breast cancer Neg Hx     Social History   Socioeconomic History   Marital status: Married    Spouse name: Anne [name]   Number of children: 1   Years of education: 14   Highest education level: Not on file  Occupational History    Employer: RETIRED  Tobacco Use   Smoking status: Never   Smokeless tobacco: Never  Vaping Use   Vaping status: Never Used  Substance and Sexual Activity   Alcohol use: No   Drug use: Never   Sexual activity: Not on file  Other Topics Concern   Not on file  Social History Narrative   Not on file   Social Drivers of Health   Financial Resource Strain: Low Risk  (01/31/2021)    Overall Financial Resource Strain (CARDIA)    Difficulty of Paying Living Expenses: Not hard at all  Food Insecurity: No Food Insecurity (06/28/2022)   Hunger Vital Sign    Worried About Running Out of Food in the Last Year: Never true    Ran Out of Food in the Last Year: Never true  Transportation Needs: No Transportation Needs (06/28/2022)   PRAPARE - Administrator, Civil Service (Medical): No    Lack of Transportation (Non-Medical): No  Physical Activity: Insufficiently Active (01/31/2021)   Exercise Vital Sign    Days of Exercise per Week: 2 days    Minutes of Exercise  per Session: 40 min  Stress: Not on file  Social Connections: Moderately Isolated (01/31/2021)   Social Connection and Isolation Panel [NHANES]    Frequency of Communication with Friends and Family: More than three times a week    Frequency of Social Gatherings with Friends and Family: Once a week    Attends Religious Services: Never    Database administrator or Organizations: No    Attends Banker Meetings: Never    Marital Status: Married  Catering manager Violence: Not At Risk (06/28/2022)   Humiliation, Afraid, Rape, and Kick questionnaire    Fear of Current or Ex-Partner: No    Emotionally Abused: No    Physically Abused: No    Sexually Abused: No    Review of Systems  Constitutional:  Negative for chills and fever.  Respiratory:  Negative for shortness of breath.   Cardiovascular:  Negative for chest pain.  Gastrointestinal:  Negative for abdominal pain, constipation, diarrhea, heartburn, nausea and vomiting.  Genitourinary:  Negative for dysuria, frequency and urgency.  Neurological:  Negative for dizziness and headaches.  Endo/Heme/Allergies:  Negative for polydipsia.  Psychiatric/Behavioral:  Negative for depression and suicidal ideas. The patient is not nervous/anxious.         Objective    BP (!) 142/64 (BP Location: Left Arm, Patient Position: Sitting, Cuff Size:  Large)   Pulse 77   Temp 97.7 F (36.5 C) (Oral)   Ht 5\' 5"  (1.651 m)   Wt 201 lb (91.2 kg)   SpO2 97%   BMI 33.45 kg/m   Physical Exam Vitals and nursing note reviewed.  Constitutional:      Appearance: Normal appearance.  Cardiovascular:     Rate and Rhythm: Normal rate and regular rhythm.     Pulses: Normal pulses.     Heart sounds: Normal heart sounds.  Pulmonary:     Effort: Pulmonary effort is normal.     Breath sounds: Normal breath sounds.  Neurological:     Mental Status: She is alert and oriented to person, place, and time.  Psychiatric:        Mood and Affect: Mood normal.        Behavior: Behavior normal.        Thought Content: Thought content normal.        Judgment: Judgment normal.         Assessment & Plan:  Hypertension, unspecified type Assessment & Plan: Slightly elevated on both readings.  Discussed medication compliance.  Continue losartan -hydrochlorothiazide  once daily.  Exam stable.    Mixed hyperlipidemia Assessment & Plan: Reviewed lipid panel from 10/2022.   Continue atorvastatin  20 mg once daily.  Refill sent.   Will check lipid panel at next visit.  Orders: -     Atorvastatin  Calcium ; TAKE 1 TABLET(20 MG) BY MOUTH DAILY  Dispense: 90 tablet; Refill: 0  Controlled type 2 diabetes mellitus without complication, without long-term current use of insulin (HCC) Assessment & Plan: Controlled. Last A1c was in November, 2024; reviewed.  Urine ACR due- will complete at next visit.  Will repeat A1c at next visit.  Foot exam UTD.   Follow up in 4 weeks. Continue metformin  and farxiga . Refills sent.  Orders: -     metFORMIN  HCl; Take 1 tablet (1,000 mg total) by mouth 2 (two) times daily with a meal.  Dispense: 90 tablet; Refill: 0 -     Dapagliflozin  Propanediol; Take 1 tablet (10 mg total) by mouth daily before breakfast.  Dispense: 30 tablet; Refill: 3  Postoperative hypothyroidism Assessment & Plan: Controlled.  She is taking  her medication correctly.   Continue levothyroxine  125 mcg once daily.  Reviewed TSH from 10/2022.      Return in about 4 weeks (around 07/05/2023) for diabetes.   Anne Nation, NP

## 2023-06-07 NOTE — Assessment & Plan Note (Signed)
 Reviewed lipid panel from 10/2022.   Continue atorvastatin  20 mg once daily.  Refill sent.   Will check lipid panel at next visit.

## 2023-06-07 NOTE — Assessment & Plan Note (Signed)
 Controlled.  She is taking her medication correctly.   Continue levothyroxine  125 mcg once daily.  Reviewed TSH from 10/2022.

## 2023-06-14 ENCOUNTER — Ambulatory Visit
Admission: RE | Admit: 2023-06-14 | Discharge: 2023-06-14 | Disposition: A | Discharge status: Home / Self Care | Source: Ambulatory Visit | Attending: Family Medicine | Admitting: Family Medicine

## 2023-06-14 DIAGNOSIS — Z1231 Encounter for screening mammogram for malignant neoplasm of breast: Secondary | ICD-10-CM

## 2023-06-20 ENCOUNTER — Other Ambulatory Visit: Payer: Self-pay

## 2023-06-20 DIAGNOSIS — I10 Essential (primary) hypertension: Secondary | ICD-10-CM | POA: Diagnosis present

## 2023-06-20 DIAGNOSIS — E785 Hyperlipidemia, unspecified: Secondary | ICD-10-CM | POA: Diagnosis present

## 2023-06-20 DIAGNOSIS — B9620 Unspecified Escherichia coli [E. coli] as the cause of diseases classified elsewhere: Secondary | ICD-10-CM | POA: Diagnosis not present

## 2023-06-20 DIAGNOSIS — Z961 Presence of intraocular lens: Secondary | ICD-10-CM | POA: Diagnosis not present

## 2023-06-20 DIAGNOSIS — R1031 Right lower quadrant pain: Secondary | ICD-10-CM | POA: Diagnosis not present

## 2023-06-20 DIAGNOSIS — N12 Tubulo-interstitial nephritis, not specified as acute or chronic: Secondary | ICD-10-CM | POA: Diagnosis not present

## 2023-06-20 DIAGNOSIS — E872 Acidosis, unspecified: Secondary | ICD-10-CM | POA: Diagnosis present

## 2023-06-20 DIAGNOSIS — E890 Postprocedural hypothyroidism: Secondary | ICD-10-CM | POA: Diagnosis not present

## 2023-06-20 DIAGNOSIS — Z881 Allergy status to other antibiotic agents status: Secondary | ICD-10-CM

## 2023-06-20 DIAGNOSIS — Z79890 Hormone replacement therapy (postmenopausal): Secondary | ICD-10-CM

## 2023-06-20 DIAGNOSIS — R6520 Severe sepsis without septic shock: Secondary | ICD-10-CM | POA: Diagnosis not present

## 2023-06-20 DIAGNOSIS — E876 Hypokalemia: Secondary | ICD-10-CM | POA: Diagnosis not present

## 2023-06-20 DIAGNOSIS — Z7984 Long term (current) use of oral hypoglycemic drugs: Secondary | ICD-10-CM | POA: Diagnosis not present

## 2023-06-20 DIAGNOSIS — Z1152 Encounter for screening for COVID-19: Secondary | ICD-10-CM

## 2023-06-20 DIAGNOSIS — N10 Acute tubulo-interstitial nephritis: Secondary | ICD-10-CM | POA: Diagnosis not present

## 2023-06-20 DIAGNOSIS — R5381 Other malaise: Secondary | ICD-10-CM | POA: Diagnosis not present

## 2023-06-20 DIAGNOSIS — K219 Gastro-esophageal reflux disease without esophagitis: Secondary | ICD-10-CM | POA: Diagnosis not present

## 2023-06-20 DIAGNOSIS — Z6834 Body mass index (BMI) 34.0-34.9, adult: Secondary | ICD-10-CM | POA: Diagnosis not present

## 2023-06-20 DIAGNOSIS — Z79899 Other long term (current) drug therapy: Secondary | ICD-10-CM | POA: Diagnosis not present

## 2023-06-20 DIAGNOSIS — E119 Type 2 diabetes mellitus without complications: Secondary | ICD-10-CM | POA: Diagnosis present

## 2023-06-20 DIAGNOSIS — R6883 Chills (without fever): Secondary | ICD-10-CM | POA: Diagnosis present

## 2023-06-20 DIAGNOSIS — A419 Sepsis, unspecified organism: Principal | ICD-10-CM | POA: Diagnosis present

## 2023-06-20 DIAGNOSIS — K5730 Diverticulosis of large intestine without perforation or abscess without bleeding: Secondary | ICD-10-CM | POA: Diagnosis not present

## 2023-06-20 DIAGNOSIS — Z85850 Personal history of malignant neoplasm of thyroid: Secondary | ICD-10-CM

## 2023-06-20 DIAGNOSIS — N170 Acute kidney failure with tubular necrosis: Secondary | ICD-10-CM | POA: Diagnosis present

## 2023-06-20 DIAGNOSIS — E669 Obesity, unspecified: Secondary | ICD-10-CM | POA: Diagnosis not present

## 2023-06-20 DIAGNOSIS — Z7982 Long term (current) use of aspirin: Secondary | ICD-10-CM

## 2023-06-20 DIAGNOSIS — Z96643 Presence of artificial hip joint, bilateral: Secondary | ICD-10-CM | POA: Diagnosis present

## 2023-06-20 DIAGNOSIS — R059 Cough, unspecified: Secondary | ICD-10-CM | POA: Diagnosis not present

## 2023-06-20 LAB — RESP PANEL BY RT-PCR (RSV, FLU A&B, COVID)  RVPGX2
Influenza A by PCR: NEGATIVE
Influenza B by PCR: NEGATIVE
Resp Syncytial Virus by PCR: NEGATIVE
SARS Coronavirus 2 by RT PCR: NEGATIVE

## 2023-06-20 NOTE — ED Triage Notes (Signed)
 Patient C/O legs being cold, and her body being warm that began today. She also states that she had some vaginal itching but that has resolved today. Additionally, she states that her mouth is very dry.

## 2023-06-21 ENCOUNTER — Inpatient Hospital Stay
Admission: EM | Admit: 2023-06-21 | Discharge: 2023-06-23 | DRG: 871 | Disposition: A | Discharge status: Home / Self Care | Attending: Obstetrics and Gynecology | Admitting: Obstetrics and Gynecology

## 2023-06-21 ENCOUNTER — Emergency Department

## 2023-06-21 DIAGNOSIS — R6883 Chills (without fever): Secondary | ICD-10-CM

## 2023-06-21 DIAGNOSIS — K5730 Diverticulosis of large intestine without perforation or abscess without bleeding: Secondary | ICD-10-CM | POA: Diagnosis not present

## 2023-06-21 DIAGNOSIS — N12 Tubulo-interstitial nephritis, not specified as acute or chronic: Principal | ICD-10-CM

## 2023-06-21 DIAGNOSIS — N179 Acute kidney failure, unspecified: Secondary | ICD-10-CM

## 2023-06-21 DIAGNOSIS — R1031 Right lower quadrant pain: Secondary | ICD-10-CM

## 2023-06-21 DIAGNOSIS — R059 Cough, unspecified: Secondary | ICD-10-CM | POA: Diagnosis not present

## 2023-06-21 DIAGNOSIS — E039 Hypothyroidism, unspecified: Secondary | ICD-10-CM | POA: Diagnosis present

## 2023-06-21 DIAGNOSIS — E119 Type 2 diabetes mellitus without complications: Secondary | ICD-10-CM

## 2023-06-21 DIAGNOSIS — R6520 Severe sepsis without septic shock: Secondary | ICD-10-CM

## 2023-06-21 DIAGNOSIS — A419 Sepsis, unspecified organism: Secondary | ICD-10-CM

## 2023-06-21 DIAGNOSIS — I10 Essential (primary) hypertension: Secondary | ICD-10-CM | POA: Diagnosis present

## 2023-06-21 LAB — COMPREHENSIVE METABOLIC PANEL WITH GFR
ALT: 33 U/L (ref 0–44)
AST: 40 U/L (ref 15–41)
Albumin: 3.4 g/dL — ABNORMAL LOW (ref 3.5–5.0)
Alkaline Phosphatase: 71 U/L (ref 38–126)
Anion gap: 13 (ref 5–15)
BUN: 19 mg/dL (ref 8–23)
CO2: 25 mmol/L (ref 22–32)
Calcium: 9.2 mg/dL (ref 8.9–10.3)
Chloride: 101 mmol/L (ref 98–111)
Creatinine, Ser: 1.37 mg/dL — ABNORMAL HIGH (ref 0.44–1.00)
GFR, Estimated: 38 mL/min — ABNORMAL LOW (ref >60)
Glucose, Bld: 188 mg/dL — ABNORMAL HIGH (ref 70–99)
Potassium: 3.4 mmol/L — ABNORMAL LOW (ref 3.5–5.1)
Sodium: 139 mmol/L (ref 135–145)
Total Bilirubin: 1.3 mg/dL — ABNORMAL HIGH (ref 0.0–1.2)
Total Protein: 7.1 g/dL (ref 6.5–8.1)

## 2023-06-21 LAB — CBC WITH DIFFERENTIAL/PLATELET
Abs Immature Granulocytes: 0.07 10*3/uL (ref 0.00–0.07)
Basophils Absolute: 0 10*3/uL (ref 0.0–0.1)
Basophils Relative: 0 %
Eosinophils Absolute: 0 10*3/uL (ref 0.0–0.5)
Eosinophils Relative: 0 %
HCT: 37.5 % (ref 36.0–46.0)
Hemoglobin: 12.6 g/dL (ref 12.0–15.0)
Immature Granulocytes: 1 %
Lymphocytes Relative: 8 %
Lymphs Abs: 1.1 10*3/uL (ref 0.7–4.0)
MCH: 31.3 pg (ref 26.0–34.0)
MCHC: 33.6 g/dL (ref 30.0–36.0)
MCV: 93.3 fL (ref 80.0–100.0)
Monocytes Absolute: 0.9 10*3/uL (ref 0.1–1.0)
Monocytes Relative: 7 %
Neutro Abs: 11.1 10*3/uL — ABNORMAL HIGH (ref 1.7–7.7)
Neutrophils Relative %: 84 %
Platelets: 152 10*3/uL (ref 150–400)
RBC: 4.02 MIL/uL (ref 3.87–5.11)
RDW: 13.3 % (ref 11.5–15.5)
WBC: 13.1 10*3/uL — ABNORMAL HIGH (ref 4.0–10.5)
nRBC: 0 % (ref 0.0–0.2)

## 2023-06-21 LAB — URINALYSIS, W/ REFLEX TO CULTURE (INFECTION SUSPECTED)
Bilirubin Urine: NEGATIVE
Glucose, UA: >=500 mg/dL — AB
Ketones, ur: NEGATIVE mg/dL
Nitrite: NEGATIVE
Protein, ur: 100 mg/dL — AB
Specific Gravity, Urine: 1.022 (ref 1.005–1.030)
WBC, UA: >50 WBC/hpf (ref 0–5)
pH: 5 (ref 5.0–8.0)

## 2023-06-21 LAB — CBG MONITORING, ED
Glucose-Capillary: 142 mg/dL — ABNORMAL HIGH (ref 70–99)
Glucose-Capillary: 216 mg/dL — ABNORMAL HIGH (ref 70–99)

## 2023-06-21 LAB — TROPONIN I (HIGH SENSITIVITY)
Troponin I (High Sensitivity): 12 ng/L (ref <18)
Troponin I (High Sensitivity): 10 ng/L (ref <18)

## 2023-06-21 LAB — CORTISOL-AM, BLOOD: Cortisol - AM: 35.6 ug/dL — ABNORMAL HIGH (ref 6.7–22.6)

## 2023-06-21 LAB — LACTIC ACID, PLASMA
Lactic Acid, Venous: 2.5 mmol/L (ref 0.5–1.9)
Lactic Acid, Venous: 2.1 mmol/L (ref 0.5–1.9)

## 2023-06-21 LAB — PROTIME-INR
INR: 1.2 (ref 0.8–1.2)
Prothrombin Time: 15 s (ref 11.4–15.2)

## 2023-06-21 LAB — GLUCOSE, CAPILLARY
Glucose-Capillary: 161 mg/dL — ABNORMAL HIGH (ref 70–99)
Glucose-Capillary: 158 mg/dL — ABNORMAL HIGH (ref 70–99)

## 2023-06-21 MED ORDER — INSULIN ASPART 100 UNIT/ML IJ SOLN: 0.0000 [IU] | Freq: Every day | INTRAMUSCULAR | Status: DC

## 2023-06-21 MED ORDER — LACTATED RINGERS IV SOLN: INTRAVENOUS | Status: DC

## 2023-06-21 MED ORDER — ENOXAPARIN SODIUM 60 MG/0.6ML IJ SOSY
0.5000 mg/kg | PREFILLED_SYRINGE | INTRAMUSCULAR | Status: DC
  Administered 2023-06-21 – 2023-06-22 (×2): 47.5 mg via SUBCUTANEOUS
  Filled 2023-06-21 (×2): qty 0.6

## 2023-06-21 MED ORDER — LACTATED RINGERS IV SOLN: 150.0000 mL/h | INTRAVENOUS | Status: DC

## 2023-06-21 MED ORDER — CIPROFLOXACIN IN D5W 400 MG/200ML IV SOLN
400.0000 mg | Freq: Two times a day (BID) | INTRAVENOUS | Status: DC
  Administered 2023-06-21 – 2023-06-23 (×5): 400 mg via INTRAVENOUS
  Filled 2023-06-21 (×6): qty 200

## 2023-06-21 MED ORDER — HYDROCODONE-ACETAMINOPHEN 5-325 MG PO TABS
1.0000 | ORAL_TABLET | ORAL | Status: DC | PRN
  Administered 2023-06-21 (×2): 1 via ORAL
  Filled 2023-06-21 (×2): qty 1

## 2023-06-21 MED ORDER — POLYETHYLENE GLYCOL 3350 17 G PO PACK
17.0000 g | PACK | Freq: Every day | ORAL | Status: DC
  Administered 2023-06-22 – 2023-06-23 (×2): 17 g via ORAL
  Filled 2023-06-21 (×2): qty 1

## 2023-06-21 MED ORDER — LEVOTHYROXINE SODIUM 50 MCG PO TABS
125.0000 ug | ORAL_TABLET | Freq: Every day | ORAL | Status: DC
  Administered 2023-06-21 – 2023-06-23 (×3): 125 ug via ORAL
  Filled 2023-06-21 (×2): qty 1
  Filled 2023-06-21: qty 3

## 2023-06-21 MED ORDER — ASPIRIN 81 MG PO TBEC: 81.0000 mg | DELAYED_RELEASE_TABLET | Freq: Every day | ORAL | Status: DC

## 2023-06-21 MED ORDER — KETOROLAC TROMETHAMINE 30 MG/ML IJ SOLN: 30.0000 mg | Freq: Four times a day (QID) | INTRAMUSCULAR | Status: DC | PRN

## 2023-06-21 MED ORDER — SODIUM CHLORIDE 0.9 % IV SOLN
1.0000 g | Freq: Once | INTRAVENOUS | Status: AC
  Administered 2023-06-21: 1 g via INTRAVENOUS
  Filled 2023-06-21: qty 10

## 2023-06-21 MED ORDER — ACETAMINOPHEN 325 MG PO TABS
650.0000 mg | ORAL_TABLET | Freq: Four times a day (QID) | ORAL | Status: DC | PRN
  Administered 2023-06-21 – 2023-06-23 (×4): 650 mg via ORAL
  Filled 2023-06-21 (×4): qty 2

## 2023-06-21 MED ORDER — ONDANSETRON HCL 4 MG PO TABS: 4.0000 mg | ORAL_TABLET | Freq: Four times a day (QID) | ORAL | Status: DC | PRN

## 2023-06-21 MED ORDER — SODIUM CHLORIDE 0.9 % IV SOLN: 2.0000 g | INTRAVENOUS | Status: DC

## 2023-06-21 MED ORDER — ATORVASTATIN CALCIUM 20 MG PO TABS
20.0000 mg | ORAL_TABLET | Freq: Every day | ORAL | Status: DC
  Administered 2023-06-21: 20 mg via ORAL
  Filled 2023-06-21: qty 1

## 2023-06-21 MED ORDER — IOHEXOL 300 MG/ML  SOLN
80.0000 mL | Freq: Once | INTRAMUSCULAR | Status: AC | PRN
  Administered 2023-06-21: 80 mL via INTRAVENOUS

## 2023-06-21 MED ORDER — LACTATED RINGERS IV BOLUS
500.0000 mL | Freq: Once | INTRAVENOUS | Status: AC
  Administered 2023-06-21: 500 mL via INTRAVENOUS

## 2023-06-21 MED ORDER — INSULIN ASPART 100 UNIT/ML IJ SOLN
0.0000 [IU] | Freq: Three times a day (TID) | INTRAMUSCULAR | Status: DC
  Administered 2023-06-21: 5 [IU] via SUBCUTANEOUS
  Administered 2023-06-21: 2 [IU] via SUBCUTANEOUS
  Administered 2023-06-21 – 2023-06-22 (×2): 3 [IU] via SUBCUTANEOUS
  Administered 2023-06-22: 2 [IU] via SUBCUTANEOUS
  Filled 2023-06-21 (×7): qty 1

## 2023-06-21 MED ORDER — ONDANSETRON HCL 4 MG/2ML IJ SOLN: 4.0000 mg | Freq: Four times a day (QID) | INTRAMUSCULAR | Status: DC | PRN

## 2023-06-21 MED ORDER — LACTATED RINGERS IV BOLUS
1000.0000 mL | Freq: Once | INTRAVENOUS | Status: AC
  Administered 2023-06-21: 1000 mL via INTRAVENOUS

## 2023-06-21 MED ORDER — ACETAMINOPHEN 650 MG RE SUPP: 650.0000 mg | Freq: Four times a day (QID) | RECTAL | Status: DC | PRN

## 2023-06-21 NOTE — Assessment & Plan Note (Signed)
 Sliding scale insulin coverage

## 2023-06-21 NOTE — Progress Notes (Signed)
 Interim progress note not for billing.  Admitted earlier this morning by my colleague. I have seen and examined the patient, reviewed the chart, and agree with assessment and plan unless stipulated otherwise.  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 Here septic with leukocytosis and elevated lactate, ct showing uncomplicated right sided pyelo. Mild aki as well. Tolerating po. Treated with fluids and ceftriaxone. I will continue with fluids but will switch abx to IV cipro  given those prior culture results. Will await sensitivities. Blood cultures were not obtained prior to abx. Anticipate discharge in 1-2 days.

## 2023-06-21 NOTE — Assessment & Plan Note (Signed)
 Likely ATN secondary to sepsis Expecting improvement with IV fluids Monitor renal function and avoid nephrotoxins Hold metformin 

## 2023-06-21 NOTE — ED Notes (Signed)
 Dr. Jodie Echevaria at bedside

## 2023-06-21 NOTE — ED Notes (Signed)
 Pt ambulatory to bathroom independently using cane assistive device. Pt with steady gait. Pt tolerated activity without distress.

## 2023-06-21 NOTE — ED Notes (Signed)
 RN placed new IV due to IV not working. RN unable to get blood on pt for 0500 labs. RN to call phlebotomy.

## 2023-06-21 NOTE — ED Notes (Signed)
 Dr. Drenda Gentle has been informed of the patient's lactic acid of 2.5 via epic secure chat.

## 2023-06-21 NOTE — Assessment & Plan Note (Signed)
 Severe sepsis Severe sepsis criteria include fever, tachycardia, leukocytosis with lactic acidosis and AKI with pyelonephritis Ceftriaxone Sepsis fluids Pain control Follow cultures

## 2023-06-21 NOTE — ED Notes (Signed)
Patient is in imaging

## 2023-06-21 NOTE — ED Notes (Signed)
 This NT assisted pt to commode and back to bed. Pt is now resting comfortably in bed, with no further needs at this time.

## 2023-06-21 NOTE — ED Provider Notes (Signed)
 [facility] Provider Note    Event Date/Time   First MD Initiated Contact with Patient 06/21/23 [phone]     (approximate)   History   Chills   HPI  Anne [name] is a 83 y.o. female with history of hypertension, hypothyroidism, hyperlipidemia, presenting with chills, feeling warm, cough, abdominal pain, dysuria.  No fevers at home, no chest pain or shortness of breath, had told triage that she had vaginal itching earlier but none now.  She did not note any vaginal itching or discharge.  States that she has some loose stools, has some nausea vomiting yesterday but none today.  On independent review, she was seen in late April to establish care, she is diabetes on Farxiga  and metformin .     Physical Exam   Triage Vital Signs: ED Triage Vitals  Encounter Vitals Group     BP 06/20/23 2044 (!) 128/53     Systolic BP Percentile --      Diastolic BP Percentile --      Pulse Rate 06/20/23 2044 (!) 110     Resp 06/20/23 2044 20     Temp 06/20/23 2044 99.8 F (37.7 C)     Temp Source 06/20/23 2044 Oral     SpO2 06/20/23 2044 95 %     Weight 06/20/23 2045 205 lb (93 kg)     Height 06/20/23 2045 5\' 5"  (1.651 m)     Head Circumference --      Peak Flow --      Pain Score 06/20/23 2050 0     Pain Loc --      Pain Education --      Exclude from Growth Chart --     Most recent vital signs: Vitals:   06/21/23 0230 06/21/23 0300  BP: (!) 111/49   Pulse: 73 74  Resp: 19 (!) 24  Temp:    SpO2: 98% 98%     General: Awake, no distress.  CV:  Good peripheral perfusion.  Resp:  Normal effort.  Clear Abd:  No distention.  Soft, tender at the right lower quadrant, no guarding Other:  No lower extremity edema, no unilateral calf swelling or tenderness, no CVA tenderness.   ED Results / Procedures / Treatments   Labs (all labs ordered are listed, but only abnormal results are displayed) Labs Reviewed  COMPREHENSIVE METABOLIC PANEL WITH GFR - Abnormal;  Notable for the following components:      Result Value   Potassium 3.4 (*)    Glucose, Bld 188 (*)    Creatinine, Ser 1.37 (*)    Albumin 3.4 (*)    Total Bilirubin 1.3 (*)    GFR, Estimated 38 (*)    All other components within normal limits  CBC WITH DIFFERENTIAL/PLATELET - Abnormal; Notable for the following components:   WBC 13.1 (*)    Neutro Abs 11.1 (*)    All other components within normal limits  LACTIC ACID, PLASMA - Abnormal; Notable for the following components:   Lactic Acid, Venous 2.5 (*)    All other components within normal limits  LACTIC ACID, PLASMA - Abnormal; Notable for the following components:   Lactic Acid, Venous 2.1 (*)    All other components within normal limits  RESP PANEL BY RT-PCR (RSV, FLU A&B, COVID)  [id number]  URINALYSIS, W/ REFLEX TO CULTURE (INFECTION SUSPECTED)  TROPONIN I (HIGH SENSITIVITY)  TROPONIN I (HIGH SENSITIVITY)     EKG  EKG shows, EKG shows  sinus rhythm, rate 76, normal QRS, normal QTc, no ischemic ST elevation, baseline is wandering but T wave flattening in 2, not significant change compared to prior   RADIOLOGY On my independent interpretation, chest x-ray without focal consolidation   PROCEDURES:  Critical Care performed: Yes, see critical care procedure note(s)  .Critical Care  Performed by: Shane Darling, MD Authorized by: Shane Darling, MD   Critical care provider statement:    Critical care time (minutes):  40   Critical care was necessary to treat or prevent imminent or life-threatening deterioration of the following conditions:  Sepsis   Critical care was time spent personally by me on the following activities:  Development of treatment plan with patient or surrogate, discussions with consultants, evaluation of patient's response to treatment, examination of patient, ordering and review of laboratory studies, ordering and review of radiographic studies, ordering and performing treatments and interventions, pulse  oximetry, re-evaluation of patient's condition and review of old charts    MEDICATIONS ORDERED IN ED: Medications  cefTRIAXone (ROCEPHIN) 1 g in sodium chloride 0.9 % 100 mL IVPB (has no administration in time range)  lactated ringers bolus 1,000 mL (has no administration in time range)  lactated ringers bolus 500 mL (0 mLs Intravenous Stopped 06/21/23 0249)  iohexol (OMNIPAQUE) 300 MG/ML solution 80 mL (80 mLs Intravenous Contrast Given 06/21/23 0304)     IMPRESSION / MDM / ASSESSMENT AND PLAN / ED COURSE  I reviewed the triage vital signs and the nursing notes.                              Differential diagnosis includes, but is not limited to, pneumonia, viral illness, gastroenteritis, colitis, diverticulitis, appendicitis, UTI, electrolyte derangements, dehydration.  Will get labs, EKG, troponin, chest x-ray, UA, CT abdomen pelvis, IV fluids.  Reassess.  Patient's presentation is most consistent with acute presentation with potential threat to life or bodily function.  Independent interpretation of labs and imaging below.  Clinical course as listed below.  Given her pyelonephritis, will start her on ceftriaxone, will give her additional fluids.  Concern for early sepsis, will plan to have her admitted for further management.  Consulted hospitalist who is agreeable with plan for admission and will evaluate the patient.  She is admitted.    Clinical Course as of 06/21/23 0417  Thu Jun 21, 2023  0221 DG Chest 2 View No active cardiopulmonary disease.  [TT]  0350 CT ABDOMEN PELVIS W CONTRAST IMPRESSION: Changes consistent with right-sided pyelonephritis.  Diverticulosis without diverticulitis.   [TT]  [phone] Independent review of labs, lactate is elevated, leukocytosis noted, she has an AKI, electrolytes are severely deranged, troponin is negative. [TT]    Clinical Course User Index [TT] Drenda Gentle Richard Champion, MD     FINAL CLINICAL IMPRESSION(S) / ED DIAGNOSES   Final diagnoses:   Chills  Right lower quadrant abdominal pain  Pyelonephritis  Sepsis, due to unspecified organism, unspecified whether acute organ dysfunction present [facility])     Rx / DC Orders   ED Discharge Orders     None        Note:  This document was prepared using Dragon voice recognition software and may include unintentional dictation errors.    Shane Darling, MD 06/21/23 [phone]

## 2023-06-21 NOTE — ED Notes (Signed)
 Pt resting at this time. Respirations even and unlabored. All items within reach. Pt has no needs at this time.

## 2023-06-21 NOTE — Assessment & Plan Note (Signed)
 Will hold home antihypertensives in the setting of severe sepsis and resume as appropriate

## 2023-06-21 NOTE — ED Notes (Signed)
 Pt assisted to change into gown and brief per request.

## 2023-06-21 NOTE — ED Notes (Signed)
 Patient transported to X-ray

## 2023-06-21 NOTE — H&P (Signed)
 History and Physical    Patient: Anne [name] JWJ:[id number] DOB: 12-26-1940 DOA: 06/21/2023 DOS: the patient was seen and examined on 06/21/2023 PCP: [name], NP  Patient coming from: Home  Chief Complaint:  Chief Complaint  Patient presents with   Chills    HPI: Anne [name] is a 83 y.o. female with medical history significant for DM, HTN, hypothyroidism being admitted with acute pyelonephritis and sepsis after presenting to the ED with 2-day history of right-sided back and flank pain radiating to the right lower quadrant, associated with chills and dysuria.  She took her temperature at home and states it was about 97.  She denies nausea and vomiting. ED course and data review: On arrival temp 99.8 and tachycardic to 110 with BP 128/53. Labs significant for WBC 13,000 lactic acid 2.5-2.1 Creatinine 1.37 above baseline of 0.89.  Total bilirubin slightly elevated at 1.3 Urinalysis showing moderate leukocyte esterase and many bacteria Respiratory viral panel negative for COVID flu and RSV Troponin normal EKG, personally viewed and interpreted showing sinus at 76 Chest x-ray nonacute CT abdomen and pelvis with contrast showing right-sided pyelonephritis  Patient treated with an LR bolus and started on ceftriaxone  Hospitalist consulted for admission.     Review of Systems: As mentioned in the history of present illness. All other systems reviewed and are negative.  Past Medical History:  Diagnosis Date   Dental crowns status    01-14-2020 per pt had upper left front tooth abscess and has temporary crown from 2 wks ago,  pt stated abscess resolved and crown is not loose   Dyspnea    per pt gets sob at times with exertion   GERD (gastroesophageal reflux disease)    History of recent fall    per pt fell 01-08-2020 has bruised hip/ buttock / back area's and hit head ,  pt stated see provider, denies any dizziness, headache, or balance issues other than before   Hypertension     followed by pcp  (nulcear study in epic 03-06-2013 normwl perfusion w/ no ischemia, nuclear ef 74%)   Hypothyroidism, 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 She has never used smokeless tobacco. She reports that she does not drink alcohol and does not use drugs.  Allergies  Allergen Reactions   Amoxicillin  Itching    vaginal itching and bleeding (side effect)    Family History  Problem Relation Age of Onset   Stroke Mother    Cancer Father    Cancer Sister    Cancer Sister    Cancer Sister    Miscarriages / Stillbirths Neg Hx    Breast cancer Neg Hx    BRCA 1/2 Neg Hx     Prior to Admission medications   Medication Sig Start Date End Date Taking? Authorizing Provider  acetaminophen  (TYLENOL ) 500 MG tablet Take 500 mg by mouth every 6 (six) hours as needed  for mild pain.    [provider]  aspirin 81 MG tablet Take 81 mg by mouth at bedtime.     [provider]  atorvastatin  (LIPITOR) 20 MG tablet TAKE 1 TABLET(20 MG) BY MOUTH DAILY 06/07/23   [name], NP  b complex vitamins tablet Take 1 tablet by mouth daily.    [provider]  Blood Glucose Monitoring Suppl (ONETOUCH VERIO) w/Device KIT Use to check blood glucose once a day (Dx: E11.9 - type 2 DM) 10/13/22   [name], [name], MD  Calcium -Phosphorus-Vitamin D  (CITRACAL +D3 PO) Take 2 tablets by mouth daily.     [provider]  Cholecalciferol (VITAMIN D -3)  5000 UNITS TABS Take 1 capsule by mouth daily.     [provider]  dapagliflozin  propanediol (FARXIGA ) 10 MG TABS tablet Take 1 tablet (10 mg total) by mouth daily before breakfast. 06/07/23   [name], NP  famotidine  (PEPCID ) 20 MG tablet TAKE 1/2 TABLET(10 MG) BY MOUTH DAILY Patient taking differently: Take 10 mg by mouth daily as needed. 07/01/18   [name], [name], MD  glucose blood (ONETOUCH VERIO) test strip Use to check blood glucose once a day (Dx: E11.9 - type 2 DM) 10/13/22   [name], [name], MD  Glycerin-Hypromellose-PEG 400 ([facility] DRY EYE RELIEF OP) Apply 2 drops to eye as needed.     [provider]  hydrochlorothiazide  (MICROZIDE ) 12.5 MG capsule Take 1 capsule (12.5 mg total) by mouth as needed. Take with losartan /hctz to equal 25 of hctz total 08/24/21   [name], Jessica C, MD  hydrocortisone  (ANUSOL -HC) 2.5 % rectal cream Place rectally 2 (two) times daily. 04/24/23   [name], NP  Lancets (ONETOUCH ULTRASOFT) lancets Use to check blood glucose once a day (Dx: E11.9 - type 2 DM) 10/13/22   [name], [name], MD  levothyroxine  (SYNTHROID ) 125 MCG tablet Take 1 tablet (125 mcg total) by mouth daily before breakfast. 03/01/22   [name], Jessica C, MD  losartan -hydrochlorothiazide  (HYZAAR) 50-12.5 MG tablet TAKE 1 TABLET BY MOUTH DAILY 07/17/22   [name], [name], MD  metFORMIN  (GLUCOPHAGE ) 1000 MG tablet Take 1 tablet (1,000 mg total) by mouth 2 (two) times daily with a meal. 06/07/23   [name], NP  methocarbamol (ROBAXIN) 500 MG tablet Take 1 tablet by mouth every 8 (eight) hours as needed.    [provider]  trolamine salicylate (ASPERCREME) 10 % cream Apply 1 application topically as needed for muscle pain.    [provider]    Physical Exam: Vitals:   06/21/23 0230 06/21/23 0300 06/21/23 0330 06/21/23 0400  BP: (!) 111/49  122/63 (!) 124/56  Pulse: 73 74 74 77  Resp: 19 (!) 24 (!) 24 (!) 22  Temp:      TempSrc:       SpO2: 98% 98% 100% 99%  Weight:      Height:       Physical Exam Vitals and nursing note reviewed.  Constitutional:      General: She is not in acute distress. HENT:     Head: Normocephalic and atraumatic.  Cardiovascular:     Rate and Rhythm: Normal rate and regular rhythm.     Heart sounds: Normal heart sounds.  Pulmonary:     Effort: Pulmonary effort is normal.     Breath sounds: Normal breath sounds.  Abdominal:     Palpations: Abdomen is soft.     Tenderness: There is no abdominal tenderness. There is  right CVA tenderness.  Neurological:     Mental Status: Mental status is at baseline.     Labs on Admission: I have personally reviewed following labs and imaging studies  CBC: Recent Labs  Lab 06/21/23 0124  WBC 13.1*  NEUTROABS 11.1*  HGB 12.6  HCT 37.5  MCV 93.3  PLT 152   Basic Metabolic Panel: Recent Labs  Lab 06/21/23 0124  NA 139  K 3.4*  CL 101  CO2 25  GLUCOSE 188*  BUN 19  CREATININE 1.37*  CALCIUM  9.2   GFR: Estimated Creatinine Clearance: 35.1 mL/min (A) (by C-G formula based on SCr of 1.37 mg/dL (H)). Liver Function Tests: Recent Labs  Lab 06/21/23 0124  AST 40  ALT 33  ALKPHOS 71  BILITOT 1.3*  PROT 7.1  ALBUMIN 3.4*   No results for input(s): "LIPASE", "AMYLASE" in the last 168 hours. No results for input(s): "AMMONIA" in the last 168 hours. Coagulation Profile: No results for input(s): "INR", "PROTIME" in the last 168 hours. Cardiac Enzymes: No results for input(s): "CKTOTAL", "CKMB", "CKMBINDEX", "TROPONINI" in the last 168 hours. BNP (last 3 results) No results for input(s): "PROBNP" in the last 8760 hours. HbA1C: No results for input(s): "HGBA1C" in the last 72 hours. CBG: No results for input(s): "GLUCAP" in the last 168 hours. Lipid Profile: No results for input(s): "CHOL", "HDL", "LDLCALC", "TRIG", "CHOLHDL", "LDLDIRECT" in the last 72 hours. Thyroid  Function Tests: No results for input(s): "TSH", "T4TOTAL",  "FREET4", "T3FREE", "THYROIDAB" in the last 72 hours. Anemia Panel: No results for input(s): "VITAMINB12", "FOLATE", "FERRITIN", "TIBC", "IRON", "RETICCTPCT" in the last 72 hours. Urine analysis:    Component Value Date/Time   COLORURINE YELLOW (A) 06/21/2023 0335   APPEARANCEUR CLOUDY (A) 06/21/2023 0335   LABSPEC 1.022 06/21/2023 0335   PHURINE 5.0 06/21/2023 0335   GLUCOSEU >=500 (A) 06/21/2023 0335   HGBUR MODERATE (A) 06/21/2023 0335   BILIRUBINUR NEGATIVE 06/21/2023 0335   BILIRUBINUR negative 06/23/2020 1416   BILIRUBINUR Negative 01/22/2014 1214   KETONESUR NEGATIVE 06/21/2023 0335   PROTEINUR 100 (A) 06/21/2023 0335   UROBILINOGEN 1.0 06/23/2020 1416   UROBILINOGEN 0.2 02/15/2015 1620   NITRITE NEGATIVE 06/21/2023 0335   LEUKOCYTESUR MODERATE (A) 06/21/2023 0335    Radiological Exams on Admission: CT ABDOMEN PELVIS W CONTRAST Result Date: 06/21/2023 CLINICAL DATA:  Right lower quadrant pain EXAM: CT ABDOMEN AND PELVIS WITH CONTRAST TECHNIQUE: Multidetector CT imaging of the abdomen and pelvis was performed using the standard protocol following bolus administration of intravenous contrast. RADIATION DOSE REDUCTION: This exam was performed according to the departmental dose-optimization program which includes automated exposure control, adjustment of the mA and/or kV according to patient size and/or use of iterative reconstruction technique. CONTRAST:  80mL OMNIPAQUE IOHEXOL 300 MG/ML  SOLN COMPARISON:  06/23/2020 FINDINGS: Lower chest: No acute abnormality. Hepatobiliary: Gallbladder is decompressed. Liver is within normal limits. Pancreas: Unremarkable. No pancreatic ductal dilatation or surrounding inflammatory changes. Spleen: Normal in size without focal abnormality. Adrenals/Urinary Tract: Adrenal glands are within normal limits. Left kidney demonstrates a normal enhancement pattern. Right kidney demonstrates patchy decreased enhancement on delayed images. Mild perinephric  inflammatory changes seen. No obstructive changes are noted although ureteral enhancement is seen on the right. These changes are consistent with pyelonephritis. The bladder is within normal limits. Stomach/Bowel: Scattered diverticular change of the colon is noted without evidence of diverticulitis. The appendix is within normal limits. Small bowel and stomach are unremarkable. Vascular/Lymphatic: Aortic atherosclerosis. No enlarged abdominal or pelvic lymph nodes.  Reproductive: Uterus and bilateral adnexa are unremarkable. Other: No abdominal wall hernia or abnormality. No abdominopelvic ascites. 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 No acute bony abnormality is seen. IMPRESSION: Changes consistent with right-sided pyelonephritis. Diverticulosis without diverticulitis. Electronically Signed   By: Violeta Grey M.D.   On: 06/21/2023 03:31   DG Chest 2 View Result Date: 06/21/2023 CLINICAL DATA:  Cough EXAM: CHEST - 2 VIEW COMPARISON:  05/18/2023 FINDINGS: The heart size and mediastinal contours are within normal limits. Both lungs are clear. The visualized skeletal structures are unremarkable. IMPRESSION: No active cardiopulmonary disease. Electronically Signed   By: Violeta Grey M.D.   On: 06/21/2023 01:52   Data Reviewed for HPI: Relevant notes from primary care and specialist visits, past discharge summaries as available in EHR, including Care Everywhere. Prior diagnostic testing as pertinent to current admission diagnoses Updated medications and problem lists for reconciliation ED course, including vitals, labs, imaging, treatment and response to treatment Triage notes, nursing and pharmacy notes and ED provider's notes Notable results as noted above in HPI      Assessment and Plan: * Pyelonephritis of right kidney Severe sepsis Severe sepsis criteria include fever, tachycardia, leukocytosis with lactic acidosis and AKI with pyelonephritis Ceftriaxone Sepsis fluids Pain  control Follow cultures  AKI (acute kidney injury) (HCC) Likely ATN secondary to sepsis Expecting improvement with IV fluids Monitor renal function and avoid nephrotoxins Hold metformin   Hypertension Will hold home antihypertensives in the setting of severe sepsis and resume as appropriate  Hypothyroid History of thyroid  cancer  Continue levothyroxine   Controlled type 2 diabetes mellitus without complication, without long-term current use of insulin (HCC) Sliding scale insulin coverage     DVT prophylaxis: Lovenox  Consults: none  Advance Care Planning: full code  Family Communication: Husband at bedside  Disposition Plan: Back to previous home environment  Severity of Illness: The appropriate patient status for this patient is OBSERVATION. Observation status is judged to be reasonable and necessary in order to provide the required intensity of service to ensure the patient's safety. The patient's presenting symptoms, physical exam findings, and initial radiographic and laboratory data in the context of their medical condition is felt to place them at decreased risk for further clinical deterioration. Furthermore, it is anticipated that the patient will be medically stable for discharge from the hospital within 2 midnights of admission.   Author: Lanetta Pion, MD 06/21/2023 4:29 AM  For on call review [email or web address].

## 2023-06-21 NOTE — ED Notes (Addendum)
 Patient ambulated independently to room commode. No dyspnea noted. Patient c/o pain in abdomen after voiding.

## 2023-06-21 NOTE — Assessment & Plan Note (Signed)
 History of thyroid  cancer  Continue levothyroxine 

## 2023-06-22 DIAGNOSIS — Z881 Allergy status to other antibiotic agents status: Secondary | ICD-10-CM | POA: Diagnosis not present

## 2023-06-22 DIAGNOSIS — Z6834 Body mass index (BMI) 34.0-34.9, adult: Secondary | ICD-10-CM | POA: Diagnosis not present

## 2023-06-22 DIAGNOSIS — I10 Essential (primary) hypertension: Secondary | ICD-10-CM | POA: Diagnosis present

## 2023-06-22 DIAGNOSIS — Z79899 Other long term (current) drug therapy: Secondary | ICD-10-CM | POA: Diagnosis not present

## 2023-06-22 DIAGNOSIS — E890 Postprocedural hypothyroidism: Secondary | ICD-10-CM | POA: Diagnosis present

## 2023-06-22 DIAGNOSIS — Z85850 Personal history of malignant neoplasm of thyroid: Secondary | ICD-10-CM | POA: Diagnosis not present

## 2023-06-22 DIAGNOSIS — N10 Acute tubulo-interstitial nephritis: Secondary | ICD-10-CM | POA: Diagnosis present

## 2023-06-22 DIAGNOSIS — E872 Acidosis, unspecified: Secondary | ICD-10-CM | POA: Diagnosis present

## 2023-06-22 DIAGNOSIS — Z7982 Long term (current) use of aspirin: Secondary | ICD-10-CM | POA: Diagnosis not present

## 2023-06-22 DIAGNOSIS — K219 Gastro-esophageal reflux disease without esophagitis: Secondary | ICD-10-CM | POA: Diagnosis present

## 2023-06-22 DIAGNOSIS — N12 Tubulo-interstitial nephritis, not specified as acute or chronic: Secondary | ICD-10-CM | POA: Diagnosis not present

## 2023-06-22 DIAGNOSIS — Z79890 Hormone replacement therapy (postmenopausal): Secondary | ICD-10-CM | POA: Diagnosis not present

## 2023-06-22 DIAGNOSIS — R6520 Severe sepsis without septic shock: Secondary | ICD-10-CM | POA: Diagnosis present

## 2023-06-22 DIAGNOSIS — N170 Acute kidney failure with tubular necrosis: Secondary | ICD-10-CM | POA: Diagnosis present

## 2023-06-22 DIAGNOSIS — E669 Obesity, unspecified: Secondary | ICD-10-CM | POA: Diagnosis present

## 2023-06-22 DIAGNOSIS — Z1152 Encounter for screening for COVID-19: Secondary | ICD-10-CM | POA: Diagnosis not present

## 2023-06-22 DIAGNOSIS — Z961 Presence of intraocular lens: Secondary | ICD-10-CM | POA: Diagnosis present

## 2023-06-22 DIAGNOSIS — Z7984 Long term (current) use of oral hypoglycemic drugs: Secondary | ICD-10-CM | POA: Diagnosis not present

## 2023-06-22 DIAGNOSIS — A419 Sepsis, unspecified organism: Secondary | ICD-10-CM | POA: Diagnosis present

## 2023-06-22 DIAGNOSIS — E785 Hyperlipidemia, unspecified: Secondary | ICD-10-CM | POA: Diagnosis present

## 2023-06-22 DIAGNOSIS — E876 Hypokalemia: Secondary | ICD-10-CM | POA: Diagnosis present

## 2023-06-22 DIAGNOSIS — R5381 Other malaise: Secondary | ICD-10-CM | POA: Diagnosis present

## 2023-06-22 DIAGNOSIS — R6883 Chills (without fever): Secondary | ICD-10-CM | POA: Diagnosis present

## 2023-06-22 DIAGNOSIS — E119 Type 2 diabetes mellitus without complications: Secondary | ICD-10-CM | POA: Diagnosis present

## 2023-06-22 DIAGNOSIS — Z96643 Presence of artificial hip joint, bilateral: Secondary | ICD-10-CM | POA: Diagnosis present

## 2023-06-22 DIAGNOSIS — B9620 Unspecified Escherichia coli [E. coli] as the cause of diseases classified elsewhere: Secondary | ICD-10-CM | POA: Diagnosis present

## 2023-06-22 LAB — CBC
HCT: 32.2 % — ABNORMAL LOW (ref 36.0–46.0)
Hemoglobin: 10.9 g/dL — ABNORMAL LOW (ref 12.0–15.0)
MCH: 31.1 pg (ref 26.0–34.0)
MCHC: 33.9 g/dL (ref 30.0–36.0)
MCV: 92 fL (ref 80.0–100.0)
Platelets: 103 10*3/uL — ABNORMAL LOW (ref 150–400)
RBC: 3.5 MIL/uL — ABNORMAL LOW (ref 3.87–5.11)
RDW: 13.3 % (ref 11.5–15.5)
WBC: 7.3 10*3/uL (ref 4.0–10.5)
nRBC: 0 % (ref 0.0–0.2)

## 2023-06-22 LAB — BASIC METABOLIC PANEL WITH GFR
Anion gap: 10 (ref 5–15)
Anion gap: 9 (ref 5–15)
BUN: 20 mg/dL (ref 8–23)
BUN: 20 mg/dL (ref 8–23)
CO2: 24 mmol/L (ref 22–32)
CO2: 24 mmol/L (ref 22–32)
Calcium: 8.3 mg/dL — ABNORMAL LOW (ref 8.9–10.3)
Calcium: 8.1 mg/dL — ABNORMAL LOW (ref 8.9–10.3)
Chloride: 100 mmol/L (ref 98–111)
Chloride: 96 mmol/L — ABNORMAL LOW (ref 98–111)
Creatinine, Ser: 1.18 mg/dL — ABNORMAL HIGH (ref 0.44–1.00)
Creatinine, Ser: 1.11 mg/dL — ABNORMAL HIGH (ref 0.44–1.00)
GFR, Estimated: 46 mL/min — ABNORMAL LOW (ref >60)
GFR, Estimated: 49 mL/min — ABNORMAL LOW (ref >60)
Glucose, Bld: 150 mg/dL — ABNORMAL HIGH (ref 70–99)
Glucose, Bld: 170 mg/dL — ABNORMAL HIGH (ref 70–99)
Potassium: 2.8 mmol/L — ABNORMAL LOW (ref 3.5–5.1)
Potassium: 3.3 mmol/L — ABNORMAL LOW (ref 3.5–5.1)
Sodium: 134 mmol/L — ABNORMAL LOW (ref 135–145)
Sodium: 129 mmol/L — ABNORMAL LOW (ref 135–145)

## 2023-06-22 LAB — GLUCOSE, CAPILLARY
Glucose-Capillary: 153 mg/dL — ABNORMAL HIGH (ref 70–99)
Glucose-Capillary: 167 mg/dL — ABNORMAL HIGH (ref 70–99)
Glucose-Capillary: 141 mg/dL — ABNORMAL HIGH (ref 70–99)
Glucose-Capillary: 180 mg/dL — ABNORMAL HIGH (ref 70–99)

## 2023-06-22 LAB — MAGNESIUM: Magnesium: 1.9 mg/dL (ref 1.7–2.4)

## 2023-06-22 LAB — LACTIC ACID, PLASMA: Lactic Acid, Venous: 1.1 mmol/L (ref 0.5–1.9)

## 2023-06-22 MED ORDER — POTASSIUM CHLORIDE CRYS ER 20 MEQ PO TBCR
40.0000 meq | EXTENDED_RELEASE_TABLET | ORAL | Status: AC
  Administered 2023-06-22 (×2): 40 meq via ORAL
  Filled 2023-06-22 (×2): qty 2

## 2023-06-22 MED ORDER — POTASSIUM CHLORIDE CRYS ER 20 MEQ PO TBCR: 40.0000 meq | EXTENDED_RELEASE_TABLET | Freq: Once | ORAL | Status: DC

## 2023-06-22 MED ORDER — POTASSIUM CHLORIDE CRYS ER 20 MEQ PO TBCR
40.0000 meq | EXTENDED_RELEASE_TABLET | Freq: Once | ORAL | Status: AC
  Administered 2023-06-22: 40 meq via ORAL
  Filled 2023-06-22: qty 2

## 2023-06-22 NOTE — Evaluation (Signed)
 Physical Therapy Evaluation Patient Details Name: Anne [name] MRN: [id number] DOB: [birth year] Today's Date: 06/22/2023  History of Present Illness  Anne [name] is a 83 y.o. female with medical history significant for DM, HTN, hypothyroidism being admitted with acute pyelonephritis and sepsis after presenting to the ED with 2-day history of right-sided back and flank pain radiating to the right lower quadrant, associated with chills and dysuria.  She took her temperature at home and states it was about 97.  She denies nausea and vomiting.   Clinical Impression  Patient received in bed, she is pleasant and agreeable to PT assessment. Patient has been getting up to the bathroom with staff. She needs min A for bed mobility and transfers. Patient ambulated around the room 2 laps with [facility] and supervision. Patient appears to be at baseline level of function and is hopeful to return home tomorrow. No skilled PT needs at this time. She will continue to benefit from ambulation with staff or mobility specialists.           If plan is discharge home, recommend the following: Assist for transportation;Help with stairs or ramp for entrance   Can travel by private vehicle    yes    Equipment Recommendations None recommended by PT  Recommendations for Other Services       Functional Status Assessment Patient has not had a recent decline in their functional status     Precautions / Restrictions Precautions Precautions: Fall Recall of Precautions/Restrictions: Intact Restrictions Weight Bearing Restrictions Per Provider Order: No      Mobility  Bed Mobility Overal bed mobility: Needs Assistance Bed Mobility: Supine to Sit     Supine to sit: Min assist     General bed mobility comments: min A to raise trunk    Transfers Overall transfer level: Needs assistance Equipment used: Straight cane Transfers: Sit to/from Stand Sit to Stand: Min assist                 Ambulation/Gait Ambulation/Gait assistance: Supervision Gait Distance (Feet): 40 Feet Assistive device: Straight cane Gait Pattern/deviations: Step-through pattern, Decreased step length - right, Decreased step length - left, Decreased stride length Gait velocity: decr     General Gait Details: patient able to ambulate with SPC around room 2 laps. No significant difficulties, or LOB  Stairs            Wheelchair Mobility     Tilt Bed    Modified Rankin (Stroke Patients Only)       Balance Overall balance assessment: Needs assistance Sitting-balance support: Feet supported Sitting balance-Leahy Scale: Good     Standing balance support: Single extremity supported, During functional activity, Reliant on assistive device for balance Standing balance-Leahy Scale: Good Standing balance comment: patient is steady wtih cane                             Pertinent Vitals/Pain Pain Assessment Pain Assessment: No/denies pain    Home Living Family/patient expects to be discharged to:: Private residence Living Arrangements: Spouse/significant other Available Help at Discharge: Family Type of Home: House Home Access: Stairs to enter   Secretary/administrator of Steps: 1   Home Layout: One level Home Equipment: Agricultural consultant (2 wheels);Cane - single point;Hospital bed      Prior Function Prior Level of Function : Independent/Modified Independent;Driving             Mobility Comments: uses cane for  ambulation at baseline. Denies falls. ADLs Comments: husband assists as needed     Extremity/Trunk Assessment   Upper Extremity Assessment Upper Extremity Assessment: Overall WFL for tasks assessed    Lower Extremity Assessment Lower Extremity Assessment: Overall WFL for tasks assessed    Cervical / Trunk Assessment Cervical / Trunk Assessment: Normal  Communication   Communication Communication: No apparent difficulties    Cognition Arousal:  Alert Behavior During Therapy: WFL for tasks assessed/performed   PT - Cognitive impairments: No apparent impairments                         Following commands: Intact       Cueing Cueing Techniques: Verbal cues     General Comments      Exercises     Assessment/Plan    PT Assessment Patient does not need any further PT services  PT Problem List         PT Treatment Interventions      PT Goals (Current goals can be found in the Care Plan section)  Acute Rehab PT Goals Patient Stated Goal: return home tomorrow PT Goal Formulation: With patient Time For Goal Achievement: 06/29/23 Potential to Achieve Goals: Good    Frequency       Co-evaluation               AM-PAC PT "6 Clicks" Mobility  Outcome Measure Help needed turning from your back to your side while in a flat bed without using bedrails?: None Help needed moving from lying on your back to sitting on the side of a flat bed without using bedrails?: None Help needed moving to and from a bed to a chair (including a wheelchair)?: None Help needed standing up from a chair using your arms (e.g., wheelchair or bedside chair)?: A Little Help needed to walk in hospital room?: A Little Help needed climbing 3-5 steps with a railing? : A Little 6 Click Score: 21    End of Session   Activity Tolerance: Patient tolerated treatment well Patient left: in bed;with call bell/phone within reach Nurse Communication: Mobility status      Time: 1610-9604 PT Time Calculation (min) (ACUTE ONLY): 12 min   Charges:   PT Evaluation $PT Eval Low Complexity: 1 Low   PT General Charges $$ ACUTE PT VISIT: 1 Visit         Imberly Troxler, PT, GCS 06/22/23,2:50 PM

## 2023-06-22 NOTE — Progress Notes (Signed)
 PROGRESS NOTE    ALIANNA HYPPOLITE  WUJ:[id number] DOB: 1940/04/08 DOA: 06/21/2023 PCP: [name], NP     Brief Narrative:   From admission h and p  nnie MORRISSA PRISOCK is a 83 y.o. female with medical history significant for DM, HTN, hypothyroidism being admitted with acute pyelonephritis and sepsis after presenting to the ED with 2-day history of right-sided back and flank pain radiating to the right lower quadrant, associated with chills and dysuria.  She took her temperature at home and states it was about 97.  She denies nausea and vomiting. ED course and data review: On arrival temp 99.8 and tachycardic to 110 with BP 128/53.  Assessment & Plan:   Principal Problem:   Pyelonephritis of right kidney Active Problems:   Severe sepsis (HCC)   AKI (acute kidney injury) (HCC)   Hypertension   Hypothyroid   Controlled type 2 diabetes mellitus without complication, without long-term current use of insulin  (HCC)  # Pyelonephritis klebsiella uti in 2022 resistant to cefazolin, ceftriaxone , and zosyn, who presented to our ER on 4/4 with uti symptoms, ua abnormal, diagnosed with uti and discharged with keflex , no culture sent, who returns with persistent dysuria and suprapubic pain now with nausea, feeling ill, and right flank pain. Here septic with leukocytosis and elevated lactate, ct showing uncomplicated right sided pyelo. Culture growing e coli - continue cipro  for no, await speciation prior to transitioning to oral abx  # Hypokalemia 2.8, mg wnl - replete.   # Sepsis, severe By leukocytosis and elevated lactate. Resolved - d/c fluids  # T2DM Glucose appropriate - ssi  # Hypothyroid - synthroid   # Obesity Noted  # Debility PT consult pending  # HTN Bps wnl in setting of sepsis - home bp meds held.   DVT prophylaxis: lovenos Code Status: full Family Communication: none at bedside. No answer when spouse and daughter telephoned today.  Level of care: Med-Surg Status  is: Inpatient Remains inpatient appropriate because: need for IV antibiotics    Consultants:  none  Procedures: none  Antimicrobials:  Ceftriaxone  > ciprofloxacin     Subjective: Reports improving flank pain, tolerating diet  Objective: Vitals:   06/21/23 1700 06/21/23 2019 06/22/23 0328 06/22/23 0733  BP: (!) 102/41 (!) 114/54 (!) 125/51 (!) 97/53  Pulse: 88 93 94 81  Resp: 18 18 18 18   Temp: 98.4 F (36.9 C) 99.7 F (37.6 C) 98.2 F (36.8 C) 98.1 F (36.7 C)  TempSrc: Axillary Oral Oral Oral  SpO2:  96% 97% 98%  Weight:      Height:       No intake or output data in the 24 hours ending 06/22/23 1251 Filed Weights   06/20/23 2045  Weight: 93 kg    Examination:  General exam: Appears calm and comfortable  Respiratory system: Clear to auscultation. Respiratory effort normal. Cardiovascular system: S1 & S2 heard, RRR.   Gastrointestinal system: Abdomen is nondistended, soft and nontender. Mild right flank tenderness Central nervous system: Alert and oriented. No focal neurological deficits. Extremities: Symmetric 5 x 5 power. Skin: No rashes, lesions or ulcers Psychiatry: Judgement and insight appear normal. Mood & affect appropriate.     Data Reviewed: I have personally reviewed following labs and imaging studies  CBC: Recent Labs  Lab 06/21/23 0124 06/22/23 0603  WBC 13.1* 7.3  NEUTROABS 11.1*  --   HGB 12.6 10.9*  HCT 37.5 32.2*  MCV 93.3 92.0  PLT 152 103*   Basic Metabolic Panel: Recent Labs  Lab 06/21/23 0124 06/22/23 0559 06/22/23 0603  NA 139  --  134*  K 3.4*  --  2.8*  CL 101  --  100  CO2 25  --  24  GLUCOSE 188*  --  150*  BUN 19  --  20  CREATININE 1.37*  --  1.18*  CALCIUM  9.2  --  8.3*  MG  --  1.9  --    GFR: Estimated Creatinine Clearance: 40.7 mL/min (A) (by C-G formula based on SCr of 1.18 mg/dL (H)). Liver Function Tests: Recent Labs  Lab 06/21/23 0124  AST 40  ALT 33  ALKPHOS 71  BILITOT 1.3*  PROT 7.1   ALBUMIN 3.4*   No results for input(s): "LIPASE", "AMYLASE" in the last 168 hours. No results for input(s): "AMMONIA" in the last 168 hours. Coagulation Profile: Recent Labs  Lab 06/21/23 0543  INR 1.2   Cardiac Enzymes: No results for input(s): "CKTOTAL", "CKMB", "CKMBINDEX", "TROPONINI" in the last 168 hours. BNP (last 3 results) No results for input(s): "PROBNP" in the last 8760 hours. HbA1C: No results for input(s): "HGBA1C" in the last 72 hours. CBG: Recent Labs  Lab 06/21/23 1200 06/21/23 1825 06/21/23 2127 06/22/23 0734 06/22/23 1122  GLUCAP 216* 161* 158* 153* 167*   Lipid Profile: No results for input(s): "CHOL", "HDL", "LDLCALC", "TRIG", "CHOLHDL", "LDLDIRECT" in the last 72 hours. Thyroid  Function Tests: No results for input(s): "TSH", "T4TOTAL", "FREET4", "T3FREE", "THYROIDAB" in the last 72 hours. Anemia Panel: No results for input(s): "VITAMINB12", "FOLATE", "FERRITIN", "TIBC", "IRON", "RETICCTPCT" in the last 72 hours. Urine analysis:    Component Value Date/Time   COLORURINE YELLOW (A) 06/21/2023 0335   APPEARANCEUR CLOUDY (A) 06/21/2023 0335   LABSPEC 1.022 06/21/2023 0335   PHURINE 5.0 06/21/2023 0335   GLUCOSEU >=500 (A) 06/21/2023 0335   HGBUR MODERATE (A) 06/21/2023 0335   BILIRUBINUR NEGATIVE 06/21/2023 0335   BILIRUBINUR negative 06/23/2020 1416   BILIRUBINUR Negative 01/22/2014 1214   KETONESUR NEGATIVE 06/21/2023 0335   PROTEINUR 100 (A) 06/21/2023 0335   UROBILINOGEN 1.0 06/23/2020 1416   UROBILINOGEN 0.2 02/15/2015 1620   NITRITE NEGATIVE 06/21/2023 0335   LEUKOCYTESUR MODERATE (A) 06/21/2023 0335   Sepsis Labs: @LABRCNTIP (procalcitonin:4,lacticidven:4)  ) Recent Results (from the past 240 hours)  Resp panel by RT-PCR (RSV, Flu A&B, Covid) Anterior Nasal Swab     Status: None   Collection Time: 06/20/23  8:44 PM   Specimen: Anterior Nasal Swab  Result Value Ref Range Status   SARS Coronavirus 2 by RT PCR NEGATIVE NEGATIVE Final     Comment: (NOTE) SARS-CoV-2 target nucleic acids are NOT DETECTED.  The SARS-CoV-2 RNA is generally detectable in upper respiratory specimens during the acute phase of infection. The lowest concentration of SARS-CoV-2 viral copies this assay can detect is 138 copies/mL. A negative result does not preclude SARS-Cov-2 infection and should not be used as the sole basis for treatment or other patient management decisions. A negative result may occur with  improper specimen collection/handling, submission of specimen other than nasopharyngeal swab, presence of viral mutation(s) within the areas targeted by this assay, and inadequate number of viral copies(<138 copies/mL). A negative result must be combined with clinical observations, patient history, and epidemiological information. The expected result is Negative.  Fact Sheet for Patients:  BloggerCourse.com  Fact Sheet for Healthcare Providers:  SeriousBroker.it  This test is no t yet approved or cleared by the [place]  FDA and  has been authorized for detection and/or diagnosis of SARS-CoV-2  by FDA under an Emergency Use Authorization (EUA). This EUA will remain  in effect (meaning this test can be used) for the duration of the COVID-19 declaration under Section 564(b)(1) of the Act, 21 U.S.C.section 360bbb-3(b)(1), unless the authorization is terminated  or revoked sooner.       Influenza A by PCR NEGATIVE NEGATIVE Final   Influenza B by PCR NEGATIVE NEGATIVE Final    Comment: (NOTE) The Xpert Xpress SARS-CoV-2/FLU/RSV plus assay is intended as an aid in the diagnosis of influenza from Nasopharyngeal swab specimens and should not be used as a sole basis for treatment. Nasal washings and aspirates are unacceptable for Xpert Xpress SARS-CoV-2/FLU/RSV testing.  Fact Sheet for Patients: BloggerCourse.com  Fact Sheet for Healthcare  Providers: SeriousBroker.it  This test is not yet approved or cleared by the [place]  FDA and has been authorized for detection and/or diagnosis of SARS-CoV-2 by FDA under an Emergency Use Authorization (EUA). This EUA will remain in effect (meaning this test can be used) for the duration of the COVID-19 declaration under Section 564(b)(1) of the Act, 21 U.S.C. section 360bbb-3(b)(1), unless the authorization is terminated or revoked.     Resp Syncytial Virus by PCR NEGATIVE NEGATIVE Final    Comment: (NOTE) Fact Sheet for Patients: BloggerCourse.com  Fact Sheet for Healthcare Providers: SeriousBroker.it  This test is not yet approved or cleared by the [place]  FDA and has been authorized for detection and/or diagnosis of SARS-CoV-2 by FDA under an Emergency Use Authorization (EUA). This EUA will remain in effect (meaning this test can be used) for the duration of the COVID-19 declaration under Section 564(b)(1) of the Act, 21 U.S.C. section 360bbb-3(b)(1), unless the authorization is terminated or revoked.  Performed at [facility], [address], [place], [place] [address]   Urine Culture     Status: Abnormal (Preliminary result)   Collection Time: 06/21/23  3:35 AM   Specimen: Urine, Random  Result Value Ref Range Status   Specimen Description   Final    URINE, RANDOM Performed at [facility], [address], [place], [place] [address]    Special Requests   Final    NONE Reflexed from [id number] Performed at [facility], [address] [place], [place] [address]    Culture (A)  Final    >=100,000 COLONIES/mL ESCHERICHIA COLI SUSCEPTIBILITIES TO FOLLOW Performed at [facility], [address] [place], [place] [address]    Report Status PENDING  Incomplete         Radiology Studies: CT ABDOMEN PELVIS W CONTRAST Result Date:  06/21/2023 CLINICAL DATA:  Right lower quadrant pain EXAM: CT ABDOMEN AND PELVIS WITH CONTRAST TECHNIQUE: Multidetector CT imaging of the abdomen and pelvis was performed using the standard protocol following bolus administration of intravenous contrast. RADIATION DOSE REDUCTION: This exam was performed according to the departmental dose-optimization program which includes automated exposure control, adjustment of the mA and/or kV according to patient size and/or use of iterative reconstruction technique. CONTRAST:  80mL OMNIPAQUE  IOHEXOL  300 MG/ML  SOLN COMPARISON:  06/23/2020 FINDINGS: Lower chest: No acute abnormality. Hepatobiliary: Gallbladder is decompressed. Liver is within normal limits. Pancreas: Unremarkable. No pancreatic ductal dilatation or surrounding inflammatory changes. Spleen: Normal in size without focal abnormality. Adrenals/Urinary Tract: Adrenal glands are within normal limits. Left kidney demonstrates a normal enhancement pattern. Right kidney demonstrates patchy decreased enhancement on delayed images. Mild perinephric inflammatory changes seen. No obstructive changes are noted although ureteral enhancement is seen on the  right. These changes are consistent with pyelonephritis. The bladder is within normal limits. Stomach/Bowel: Scattered diverticular change of the colon is noted without evidence of diverticulitis. The appendix is within normal limits. Small bowel and stomach are unremarkable. Vascular/Lymphatic: Aortic atherosclerosis. No enlarged abdominal or pelvic lymph nodes. Reproductive: Uterus and bilateral adnexa are unremarkable. Other: No abdominal wall hernia or abnormality. No abdominopelvic ascites. 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 No acute bony abnormality is seen. IMPRESSION: Changes consistent with right-sided pyelonephritis. Diverticulosis without diverticulitis. Electronically Signed   By: Violeta Grey M.D.   On: 06/21/2023 03:31   DG Chest 2  View Result Date: 06/21/2023 CLINICAL DATA:  Cough EXAM: CHEST - 2 VIEW COMPARISON:  05/18/2023 FINDINGS: The heart size and mediastinal contours are within normal limits. Both lungs are clear. The visualized skeletal structures are unremarkable. IMPRESSION: No active cardiopulmonary disease. Electronically Signed   By: Violeta Grey M.D.   On: 06/21/2023 01:52        Scheduled Meds:  enoxaparin  (LOVENOX ) injection  0.5 mg/kg Subcutaneous Q24H   insulin  aspart  0-15 Units Subcutaneous TID WC   insulin  aspart  0-5 Units Subcutaneous QHS   levothyroxine   125 mcg Oral Q0600   polyethylene glycol  17 g Oral Daily   potassium chloride  40 mEq Oral Q2H   Continuous Infusions:  ciprofloxacin  400 mg (06/22/23 0951)     LOS: 0 days     Raymonde Calico, MD [facility]   If 7PM-7AM, please contact night-coverage [email or web address] Password [email or web address] 06/22/2023, 12:51 PM

## 2023-06-23 DIAGNOSIS — N12 Tubulo-interstitial nephritis, not specified as acute or chronic: Secondary | ICD-10-CM | POA: Diagnosis not present

## 2023-06-23 LAB — URINE CULTURE: Culture: >=100000 — AB

## 2023-06-23 LAB — BASIC METABOLIC PANEL WITH GFR
Anion gap: 7 (ref 5–15)
BUN: 16 mg/dL (ref 8–23)
CO2: 26 mmol/L (ref 22–32)
Calcium: 8.5 mg/dL — ABNORMAL LOW (ref 8.9–10.3)
Chloride: 105 mmol/L (ref 98–111)
Creatinine, Ser: 0.98 mg/dL (ref 0.44–1.00)
GFR, Estimated: 57 mL/min — ABNORMAL LOW (ref >60)
Glucose, Bld: 163 mg/dL — ABNORMAL HIGH (ref 70–99)
Potassium: 4 mmol/L (ref 3.5–5.1)
Sodium: 135 mmol/L (ref 135–145)

## 2023-06-23 LAB — CBC
HCT: 31.8 % — ABNORMAL LOW (ref 36.0–46.0)
Hemoglobin: 10.7 g/dL — ABNORMAL LOW (ref 12.0–15.0)
MCH: 30.9 pg (ref 26.0–34.0)
MCHC: 33.6 g/dL (ref 30.0–36.0)
MCV: 91.9 fL (ref 80.0–100.0)
Platelets: 122 10*3/uL — ABNORMAL LOW (ref 150–400)
RBC: 3.46 MIL/uL — ABNORMAL LOW (ref 3.87–5.11)
RDW: 13.2 % (ref 11.5–15.5)
WBC: 6.5 10*3/uL (ref 4.0–10.5)
nRBC: 0 % (ref 0.0–0.2)

## 2023-06-23 LAB — GLUCOSE, CAPILLARY: Glucose-Capillary: 175 mg/dL — ABNORMAL HIGH (ref 70–99)

## 2023-06-23 MED ORDER — CIPROFLOXACIN HCL 500 MG PO TABS: 500.0000 mg | ORAL_TABLET | Freq: Two times a day (BID) | ORAL | 0 refills | Status: AC

## 2023-06-23 NOTE — Discharge Summary (Signed)
 Anne [name]:[id number] DOB: 1940-08-17 DOA: 06/21/2023  PCP: [name], NP  Admit date: 06/21/2023 Discharge date: 06/23/2023  Time spent: 35 minutes  Recommendations for Outpatient Follow-up:  Pcp f/u 1-2 weeks, check bp then     Discharge Diagnoses:  Principal Problem:   Pyelonephritis of right kidney Active Problems:   Severe sepsis (HCC)   AKI (acute kidney injury) (HCC)   Hypertension   Hypothyroid   Controlled type 2 diabetes mellitus without complication, without long-term current use of insulin  (HCC)   Discharge Condition: improved  Diet recommendation: carb modified  Filed Weights   06/20/23 2045  Weight: 93 kg    History of present illness:  From admission h and p  Anne [name] is a 83 y.o. female with medical history significant for DM, HTN, hypothyroidism being admitted with acute pyelonephritis and sepsis after presenting to the ED with 2-day history of right-sided back and flank pain radiating to the right lower quadrant, associated with chills and dysuria.  She took her temperature at home and states it was about 97.  She denies nausea and vomiting. ED course and data review: On arrival temp 99.8 and tachycardic to 110 with BP 128/53. Labs significant for WBC 13,000 lactic acid 2.5-2.1 Creatinine 1.37 above baseline of 0.89.  Total bilirubin slightly elevated at 1.3 Urinalysis showing moderate leukocyte esterase and many bacteria Respiratory viral panel negative for COVID flu and RSV Troponin normal EKG, personally viewed and interpreted showing sinus at 76 Chest x-ray nonacute CT abdomen and pelvis with contrast showing right-sided pyelonephritis   Patient treated with an LR bolus and started on ceftriaxone    Hospital Course:  Patient presents with uncomplicated right sided pyelonephritis. Diagnosed with uti a little over a month ago, finished a course of keflex , no urine culture sent then. Culture here growing e coli, interestingly sensitive  to cephalosporins. Here she was treated with IV ciprofloxacin  and symptomatically improved, tolerating diet, ambulating independently.  BPs soft so holding home BP meds until outpt pcp f/u. She started farxiga  about 6 months ago and now with 2 UTIs think best to hold this and would advise avoiding [id number] in the future. Septic by elevated lactate and leukocytosis, sepsis now resolved.   Procedures: none   Consultations: none  Discharge Exam: Vitals:   06/23/23 0642 06/23/23 0902  BP: (!) 128/51 124/66  Pulse: 87 96  Resp: 20 17  Temp: 98.2 F (36.8 C) 97.7 F (36.5 C)  SpO2: 99% 98%    General: NAD Cardiovascular: RRR Respiratory: CTAB Abdomen: soft, non-tender  Discharge Instructions   Discharge Instructions     Diet Carb Modified   Complete by: As directed    Increase activity slowly   Complete by: As directed       Allergies as of 06/23/2023       Reactions   Amoxicillin  Itching   vaginal itching and bleeding (side effect)        Medication List     PAUSE taking these medications    dapagliflozin  propanediol 10 MG Tabs tablet Wait to take this until your doctor or other care provider tells you to start again. Commonly known as: Farxiga  Take 1 tablet (10 mg total) by mouth daily before breakfast.   losartan -hydrochlorothiazide  50-12.5 MG tablet Wait to take this until your doctor or other care provider tells you to start again. Commonly known as: HYZAAR TAKE 1 TABLET BY MOUTH DAILY       TAKE these medications  acetaminophen  500 MG tablet Commonly known as: TYLENOL  Take 500 mg by mouth every 6 (six) hours as needed for mild pain.   aspirin  81 MG tablet Take 81 mg by mouth at bedtime.   atorvastatin  20 MG tablet Commonly known as: LIPITOR TAKE 1 TABLET(20 MG) BY MOUTH DAILY   b complex vitamins tablet Take 1 tablet by mouth daily.   ciprofloxacin  500 MG tablet Commonly known as: Cipro  Take 1 tablet (500 mg total) by mouth 2 (two)  times daily for 9 doses.   CITRACAL +D3 PO Take 2 tablets by mouth daily.   [employer] DRY EYE RELIEF OP Apply 2 drops to eye as needed.   hydrocortisone  2.5 % rectal cream Commonly known as: ANUSOL -HC Place rectally 2 (two) times daily.   levothyroxine  125 MCG tablet Commonly known as: SYNTHROID  Take 1 tablet (125 mcg total) by mouth daily before breakfast.   metFORMIN  1000 MG tablet Commonly known as: GLUCOPHAGE  Take 1 tablet (1,000 mg total) by mouth 2 (two) times daily with a meal.   onetouch ultrasoft lancets Use to check blood glucose once a day (Dx: E11.9 - type 2 DM)   OneTouch Verio test strip Generic drug: glucose blood Use to check blood glucose once a day (Dx: E11.9 - type 2 DM)   OneTouch Verio w/Device Kit Use to check blood glucose once a day (Dx: E11.9 - type 2 DM)   trolamine salicylate 10 % cream Commonly known as: ASPERCREME Apply 1 application topically as needed for muscle pain.   Vitamin D -3 125 MCG (5000 UT) Tabs Take 1 capsule by mouth daily.       Allergies  Allergen Reactions   Amoxicillin  Itching    vaginal itching and bleeding (side effect)    Follow-up Information     [name], NP Follow up.   Specialty: General Practice Why: 1-2 weeks Contact information: [address] [place] [address] [phone]                  The results of significant diagnostics from this hospitalization (including imaging, microbiology, ancillary and laboratory) are listed below for reference.    Significant Diagnostic Studies: CT ABDOMEN PELVIS W CONTRAST Result Date: 06/21/2023 CLINICAL DATA:  Right lower quadrant pain EXAM: CT ABDOMEN AND PELVIS WITH CONTRAST TECHNIQUE: Multidetector CT imaging of the abdomen and pelvis was performed using the standard protocol following bolus administration of intravenous contrast. RADIATION DOSE REDUCTION: This exam was performed according to the departmental dose-optimization program which  includes automated exposure control, adjustment of the mA and/or kV according to patient size and/or use of iterative reconstruction technique. CONTRAST:  80mL OMNIPAQUE  IOHEXOL  300 MG/ML  SOLN COMPARISON:  06/23/2020 FINDINGS: Lower chest: No acute abnormality. Hepatobiliary: Gallbladder is decompressed. Liver is within normal limits. Pancreas: Unremarkable. No pancreatic ductal dilatation or surrounding inflammatory changes. Spleen: Normal in size without focal abnormality. Adrenals/Urinary Tract: Adrenal glands are within normal limits. Left kidney demonstrates a normal enhancement pattern. Right kidney demonstrates patchy decreased enhancement on delayed images. Mild perinephric inflammatory changes seen. No obstructive changes are noted although ureteral enhancement is seen on the right. These changes are consistent with pyelonephritis. The bladder is within normal limits. Stomach/Bowel: Scattered diverticular change of the colon is noted without evidence of diverticulitis. The appendix is within normal limits. Small bowel and stomach are unremarkable. Vascular/Lymphatic: Aortic atherosclerosis. No enlarged abdominal or pelvic lymph nodes. Reproductive: Uterus and bilateral adnexa are unremarkable. Other: No abdominal wall hernia or abnormality. No  abdominopelvic ascites. 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 No acute bony abnormality is seen. IMPRESSION: Changes consistent with right-sided pyelonephritis. Diverticulosis without diverticulitis. Electronically Signed   By: Violeta Grey M.D.   On: 06/21/2023 03:31   DG Chest 2 View Result Date: 06/21/2023 CLINICAL DATA:  Cough EXAM: CHEST - 2 VIEW COMPARISON:  05/18/2023 FINDINGS: The heart size and mediastinal contours are within normal limits. Both lungs are clear. The visualized skeletal structures are unremarkable. IMPRESSION: No active cardiopulmonary disease. Electronically Signed   By: Violeta Grey M.D.   On: 06/21/2023 01:52   MM 3D  SCREENING MAMMOGRAM BILATERAL BREAST Result Date: 06/18/2023 CLINICAL DATA:  Screening. EXAM: DIGITAL SCREENING BILATERAL MAMMOGRAM WITH TOMOSYNTHESIS AND CAD TECHNIQUE: Bilateral screening digital craniocaudal and mediolateral oblique mammograms were obtained. Bilateral screening digital breast tomosynthesis was performed. The images were evaluated with computer-aided detection. COMPARISON:  Previous exam(s). ACR Breast Density Category b: There are scattered areas of fibroglandular density. FINDINGS: There are no findings suspicious for malignancy. IMPRESSION: No mammographic evidence of malignancy. A result letter of this screening mammogram will be mailed directly to the patient. RECOMMENDATION: Screening mammogram in one year. (Code:[id number]-B-01Y) BI-RADS CATEGORY  1: Negative. Electronically Signed   By: Sundra Engel M.D.   On: 06/18/2023 09:53    Microbiology: Recent Results (from the past 240 hours)  Resp panel by RT-PCR (RSV, Flu A&B, Covid) Anterior Nasal Swab     Status: None   Collection Time: 06/20/23  8:44 PM   Specimen: Anterior Nasal Swab  Result Value Ref Range Status   SARS Coronavirus 2 by RT PCR NEGATIVE NEGATIVE Final    Comment: (NOTE) SARS-CoV-2 target nucleic acids are NOT DETECTED.  The SARS-CoV-2 RNA is generally detectable in upper respiratory specimens during the acute phase of infection. The lowest concentration of SARS-CoV-2 viral copies this assay can detect is 138 copies/mL. A negative result does not preclude SARS-Cov-2 infection and should not be used as the sole basis for treatment or other patient management decisions. A negative result may occur with  improper specimen collection/handling, submission of specimen other than nasopharyngeal swab, presence of viral mutation(s) within the areas targeted by this assay, and inadequate number of viral copies(<138 copies/mL). A negative result must be combined with clinical observations, patient history, and  epidemiological information. The expected result is Negative.  Fact Sheet for Patients:  BloggerCourse.com  Fact Sheet for Healthcare Providers:  SeriousBroker.it  This test is no t yet approved or cleared by the [place]  FDA and  has been authorized for detection and/or diagnosis of SARS-CoV-2 by FDA under an Emergency Use Authorization (EUA). This EUA will remain  in effect (meaning this test can be used) for the duration of the COVID-19 declaration under Section 564(b)(1) of the Act, 21 U.S.C.section 360bbb-3(b)(1), unless the authorization is terminated  or revoked sooner.       Influenza A by PCR NEGATIVE NEGATIVE Final   Influenza B by PCR NEGATIVE NEGATIVE Final    Comment: (NOTE) The Xpert Xpress SARS-CoV-2/FLU/RSV plus assay is intended as an aid in the diagnosis of influenza from Nasopharyngeal swab specimens and should not be used as a sole basis for treatment. Nasal washings and aspirates are unacceptable for Xpert Xpress SARS-CoV-2/FLU/RSV testing.  Fact Sheet for Patients: BloggerCourse.com  Fact Sheet for Healthcare Providers: SeriousBroker.it  This test is not yet approved or cleared by the [place]  FDA and has been authorized for detection and/or diagnosis of SARS-CoV-2 by FDA under an Emergency Use Authorization (  EUA). This EUA will remain in effect (meaning this test can be used) for the duration of the COVID-19 declaration under Section 564(b)(1) of the Act, 21 U.S.C. section 360bbb-3(b)(1), unless the authorization is terminated or revoked.     Resp Syncytial Virus by PCR NEGATIVE NEGATIVE Final    Comment: (NOTE) Fact Sheet for Patients: BloggerCourse.com  Fact Sheet for Healthcare Providers: SeriousBroker.it  This test is not yet approved or cleared by the [place]  FDA and has been  authorized for detection and/or diagnosis of SARS-CoV-2 by FDA under an Emergency Use Authorization (EUA). This EUA will remain in effect (meaning this test can be used) for the duration of the COVID-19 declaration under Section 564(b)(1) of the Act, 21 U.S.C. section 360bbb-3(b)(1), unless the authorization is terminated or revoked.  Performed at [facility], [address] [place], [place] [address]   Urine Culture     Status: Abnormal   Collection Time: 06/21/23  3:35 AM   Specimen: Urine, Random  Result Value Ref Range Status   Specimen Description   Final    URINE, RANDOM Performed at [facility], [address] [place], [place] [address]    Special Requests   Final    NONE Reflexed from [id number] Performed at [facility] [address] [place], [place] [address]    Culture >=100,000 COLONIES/mL ESCHERICHIA COLI (A)  Final   Report Status 06/23/2023 FINAL  Final   Organism ID, Bacteria ESCHERICHIA COLI (A)  Final      Susceptibility   Escherichia coli - MIC*    AMPICILLIN >=32 RESISTANT Resistant     CEFAZOLIN <=4 SENSITIVE Sensitive     CEFEPIME <=0.12 SENSITIVE Sensitive     CEFTRIAXONE  <=0.25 SENSITIVE Sensitive     CIPROFLOXACIN  <=0.25 SENSITIVE Sensitive     GENTAMICIN <=1 SENSITIVE Sensitive     IMIPENEM <=0.25 SENSITIVE Sensitive     NITROFURANTOIN <=16 SENSITIVE Sensitive     TRIMETH/SULFA <=20 SENSITIVE Sensitive     AMPICILLIN/SULBACTAM 16 INTERMEDIATE Intermediate     PIP/TAZO <=4 SENSITIVE Sensitive ug/mL    * >=100,000 COLONIES/mL ESCHERICHIA COLI     Labs: Basic Metabolic Panel: Recent Labs  Lab 06/21/23 0124 06/22/23 0559 06/22/23 0603 06/22/23 1403 06/23/23 0428  NA 139  --  134* 129* 135  K 3.4*  --  2.8* 3.3* 4.0  CL 101  --  100 96* 105  CO2 25  --  24 24 26   GLUCOSE 188*  --  150* 170* 163*  BUN 19  --  20 20 16   CREATININE 1.37*  --  1.18* 1.11* 0.98  CALCIUM  9.2  --  8.3* 8.1* 8.5*  MG  --  1.9  --    --   --    Liver Function Tests: Recent Labs  Lab 06/21/23 0124  AST 40  ALT 33  ALKPHOS 71  BILITOT 1.3*  PROT 7.1  ALBUMIN 3.4*   No results for input(s): "LIPASE", "AMYLASE" in the last 168 hours. No results for input(s): "AMMONIA" in the last 168 hours. CBC: Recent Labs  Lab 06/21/23 0124 06/22/23 0603 06/23/23 0428  WBC 13.1* 7.3 6.5  NEUTROABS 11.1*  --   --   HGB 12.6 10.9* 10.7*  HCT 37.5 32.2* 31.8*  MCV 93.3 92.0 91.9  PLT 152 103* 122*   Cardiac Enzymes: No results for input(s): "CKTOTAL", "CKMB", "CKMBINDEX", "TROPONINI" in the last 168 hours. BNP: BNP (last 3 results) No results for input(s): "BNP" in  the last 8760 hours.  ProBNP (last 3 results) No results for input(s): "PROBNP" in the last 8760 hours.  CBG: Recent Labs  Lab 06/22/23 0734 06/22/23 1122 06/22/23 1557 06/22/23 2152 06/23/23 0903  GLUCAP 153* 167* 141* 180* 175*       Signed:  Raymonde Calico MD.  [facility] 06/23/2023, 10:58 AM

## 2023-06-25 ENCOUNTER — Telehealth: Payer: Self-pay

## 2023-06-25 NOTE — Transitions of Care (Post Inpatient/ED Visit) (Signed)
   06/25/2023  Name: JALEA GASPAR MRN: [id number] DOB: 03-15-1940  Today's TOC FU Call Status: Today's TOC FU Call Status:: Unsuccessful Call (1st Attempt) Unsuccessful Call (1st Attempt) Date: 06/25/23  Attempted to reach the patient regarding the most recent Inpatient/ED visit. Left a HIPAA approved voicemail message requesting a return call.  Follow Up Plan: Additional outreach attempts will be made to reach the patient to complete the Transitions of Care (Post Inpatient/ED visit) call.   Brown Cape, RN, BSN, CCM [facility], [facility] RN Care Manager Direct Dial: [phone]

## 2023-06-25 NOTE — Patient Instructions (Signed)
 Visit Information  Thank you for taking time to visit with me today. Please don't hesitate to contact me if I can be of assistance to you before our next scheduled telephone appointment.  Our next appointment is by telephone on Jul 05, 2023 at 10:30 am  Following is a copy of your care plan:   Goals Addressed             This Visit's Progress    VBCI Transitions of Care (TOC) Care Plan       Problems:  Recent Hospitalization for treatment of DMII and Sepsis Medication management barrier Has 2 medications on hold until she sees PCP next week  Goal:  Over the next 30 days, the patient will not experience hospital readmission  Interventions:   Diabetes Interventions: Assessed patient's understanding of A1c goal: patient's last A1C was at goal Reviewed medications with patient and discussed importance of medication adherence Discussed plans with patient for ongoing care management follow up and provided patient with direct contact information for care management team Reviewed scheduled/upcoming provider appointments including: PCP on next Monday 07/02/23  Lab Results  Component Value Date   HGBA1C 7.0 12/27/2022  Monitor for signs and symptoms of infection, fever, pain with urinating and color of urine changes  Patient Self Care Activities:  Attend all scheduled provider appointments Call provider office for new concerns or questions  Take medications as prescribed    Plan:  Telephone follow up appointment with care management team member scheduled for:  Jul 05, 2023 at 10:30 am Follow up on medications that were paused        The patient verbalized understanding of instructions, educational materials, and care plan provided today and agreed to receive a mailed copy of patient instructions, educational materials, and care plan.   The patient has been provided with contact information for the care management team and has been advised to call with any health related  questions or concerns.  Next PCP appointment scheduled for:  Jul 02, 2023 at 11:20 am  Please call the care guide team at [phone] if you need to cancel or reschedule your appointment.   Please call the [place]  National Suicide Prevention Lifeline: 715-486-6725 or TTY: (980) 023-5979 TTY [phone]) to talk to a trained counselor if you are experiencing a Mental Health or Behavioral Health Crisis or need someone to talk to.  Brown Cape, RN, BSN, CCM [facility], [facility] RN Care Manager Direct Dial: [phone]

## 2023-06-25 NOTE — Transitions of Care (Post Inpatient/ED Visit) (Signed)
 06/25/2023  Name: Anne [name] MRN: [id number] DOB: [birth year]  Today's TOC FU Call Status: Today's TOC FU Call Status:: Successful TOC FU Call Completed TOC FU Call Complete Date: 06/25/23 Patient's Name and Date of Birth confirmed.  Transition Care Management Follow-up Telephone Call Date of Discharge: 06/23/23 Discharge Facility: [facility]) Type of Discharge: Inpatient Admission How have you been since you were released from the hospital?: Better Any questions or concerns?: No  Items Reviewed: Did you receive and understand the discharge instructions provided?: Yes Medications obtained,verified, and reconciled?: Yes (Medications Reviewed) Any new allergies since your discharge?: No Dietary orders reviewed?: Yes Type of Diet Ordered:: Diabete Heart Healthy Do you have support at home?: Yes People in Home [RPT]: spouse Name of Support/Comfort Primary Source: [name]  Medications Reviewed Today:  Has 2 medications on hold til seeing PCP per AVS Medications Reviewed Today     Reviewed by Jamie Mccoy, RN (Registered Nurse) on 06/25/23 at 1438  Med List Status: <None>   Medication Order Taking? Sig Documenting Provider Last Dose Status Informant  acetaminophen  (TYLENOL ) 500 MG tablet [id number] Yes Take 500 mg by mouth every 6 (six) hours as needed for mild pain. [provider] Taking Active Self, Pharmacy Records           Med Note Anne [name], Anne [name] Jun 25, 2023  2:35 PM) As needed  aspirin  81 MG tablet [id number] Yes Take 81 mg by mouth at bedtime.  [provider] Taking Active Self, Pharmacy Records  atorvastatin  (LIPITOR) 20 MG tablet [id number] Yes TAKE 1 TABLET(20 MG) BY MOUTH DAILY [name], NP Taking Active Self, Pharmacy Records  b complex vitamins tablet [id number] Yes Take 1 tablet by mouth daily. [provider] Taking Active Self, Pharmacy Records  Blood Glucose Monitoring Suppl [facility]  VERIO) w/Device KIT [id number] Yes Use to check blood glucose once a day (Dx: E11.9 - type 2 DM) [name], [name], MD Taking Active Self, Pharmacy Records  Calcium -Phosphorus-Vitamin D  (CITRACAL +D3 PO) [id number] Yes Take 2 tablets by mouth daily.  [provider] Taking Active Self, Pharmacy Records  Cholecalciferol (VITAMIN D -3) 5000 UNITS TABS [id number] Yes Take 1 capsule by mouth daily.  [provider] Taking Active Self, Pharmacy Records           Med Note Ren Carne, DENISE P   Wed Jan 14, 2020  1:22 PM) .  ciprofloxacin  (CIPRO ) 500 MG tablet [id number] Yes Take 1 tablet (500 mg total) by mouth 2 (two) times daily for 9 doses. Wouk, Haynes Lips, MD Taking Active   dapagliflozin  propanediol (FARXIGA ) 10 MG TABS tablet [id number] No Take 1 tablet (10 mg total) by mouth daily before breakfast.  Patient not taking: Reported on 06/25/2023   [name], NP Not Taking Active Self, Pharmacy Records  glucose blood [name] VERIO) test strip [id number] Yes Use to check blood glucose once a day (Dx: E11.9 - type 2 DM) [name], [name], MD Taking Active Self, Pharmacy Records  Glycerin-Hypromellose-PEG 400 ([facility] DRY EYE RELIEF OP) [id number] Yes Apply 2 drops to eye as needed.  [provider] Taking Active Self, Pharmacy Records  hydrocortisone  (ANUSOL -HC) 2.5 % rectal cream [id number] Yes Place rectally 2 (two) times daily. [name], NP Taking Active Self, Pharmacy Records  Lancets [facility] ULTRASOFT) lancets [id number] Yes Use to check blood glucose once a day (Dx: E11.9 - type 2 DM) [name], [name], MD Taking Active Self, Pharmacy Records  levothyroxine  (  SYNTHROID ) 125 MCG tablet [id number] Yes Take 1 tablet (125 mcg total) by mouth daily before breakfast. [name], [name], MD Taking Active Self, Pharmacy Records  losartan -hydrochlorothiazide  (HYZAAR) 50-12.5 MG tablet [id number] No TAKE 1 TABLET BY MOUTH DAILY  Patient not taking: Reported on 06/25/2023   [name],  [name], MD Not Taking Active Self, Pharmacy Records  metFORMIN  (GLUCOPHAGE ) 1000 MG tablet [id number] Yes Take 1 tablet (1,000 mg total) by mouth 2 (two) times daily with a meal. [name], NP Taking Active Self, Pharmacy Records  trolamine salicylate (ASPERCREME) 10 % cream [id number] Yes Apply 1 application topically as needed for muscle pain. [provider] Taking Active Self, Pharmacy Records            Home Care and Equipment/Supplies: Were Home Health Services Ordered?: No Any new equipment or medical supplies ordered?: No  Functional Questionnaire: Do you need assistance with bathing/showering or dressing?: Yes Do you need assistance with meal preparation?: No Do you need assistance with eating?: No Do you have difficulty maintaining continence: No Do you need assistance with getting out of bed/getting out of a chair/moving?: Yes (when not in wheelchair or chair with arms) Do you have difficulty managing or taking your medications?: Yes (husband help with out)  Follow up appointments reviewed: PCP Follow-up appointment confirmed?: Yes Date of PCP follow-up appointment?: 07/02/23 Follow-up Provider: [name], NP Specialist Hospital Follow-up appointment confirmed?: NA Do you need transportation to your follow-up appointment?: No Do you understand care options if your condition(s) worsen?: Yes-patient verbalized understanding  SDOH Interventions Today    Flowsheet Row Most Recent Value  SDOH Interventions   Food Insecurity Interventions Intervention Not Indicated  Housing Interventions Intervention Not Indicated  Transportation Interventions Intervention Not Indicated  Utilities Interventions Intervention Not Indicated       Goals Addressed             This Visit's Progress    VBCI Transitions of Care (TOC) Care Plan       Problems:  Recent Hospitalization for treatment of DMII and Sepsis Medication management barrier Has 2 medications on  hold until she sees PCP next week  Goal:  Over the next 30 days, the patient will not experience hospital readmission  Interventions:   Diabetes Interventions: Assessed patient's understanding of A1c goal: patient's last A1C was at goal Reviewed medications with patient and discussed importance of medication adherence Discussed plans with patient for ongoing care management follow up and provided patient with direct contact information for care management team Reviewed scheduled/upcoming provider appointments including: PCP on next Monday 07/02/23  Lab Results  Component Value Date   HGBA1C 7.0 12/27/2022  Monitor for signs and symptoms of infection, fever, pain with urinating and color of urine changes  Patient Self Care Activities:  Attend all scheduled provider appointments Call provider office for new concerns or questions  Take medications as prescribed    Plan:  Telephone follow up appointment with care management team member scheduled for:  Jul 05, 2023 at 10:30 am Follow up on medications that were paused        Brown Cape, RN, Scientist, research (physical sciences), CCM [employer], [facility] Health RN Care Manager Direct Dial: [phone]

## 2023-06-29 DIAGNOSIS — N390 Urinary tract infection, site not specified: Secondary | ICD-10-CM | POA: Diagnosis not present

## 2023-06-29 DIAGNOSIS — I10 Essential (primary) hypertension: Secondary | ICD-10-CM | POA: Diagnosis not present

## 2023-06-29 DIAGNOSIS — E119 Type 2 diabetes mellitus without complications: Secondary | ICD-10-CM | POA: Diagnosis not present

## 2023-06-29 DIAGNOSIS — Z13220 Encounter for screening for lipoid disorders: Secondary | ICD-10-CM | POA: Diagnosis not present

## 2023-06-29 DIAGNOSIS — E039 Hypothyroidism, unspecified: Secondary | ICD-10-CM | POA: Diagnosis not present

## 2023-06-29 DIAGNOSIS — Z1321 Encounter for screening for nutritional disorder: Secondary | ICD-10-CM | POA: Diagnosis not present

## 2023-06-29 DIAGNOSIS — R7989 Other specified abnormal findings of blood chemistry: Secondary | ICD-10-CM | POA: Diagnosis not present

## 2023-06-29 DIAGNOSIS — Z87440 Personal history of urinary (tract) infections: Secondary | ICD-10-CM | POA: Diagnosis not present

## 2023-06-29 DIAGNOSIS — E785 Hyperlipidemia, unspecified: Secondary | ICD-10-CM | POA: Diagnosis not present

## 2023-07-02 ENCOUNTER — Inpatient Hospital Stay: Admitting: General Practice

## 2023-07-04 ENCOUNTER — Telehealth: Payer: Self-pay

## 2023-07-04 ENCOUNTER — Telehealth: Payer: Self-pay | Admitting: *Deleted

## 2023-07-04 NOTE — Transitions of Care (Post Inpatient/ED Visit) (Signed)
   07/04/2023  Name: Anne [name] MRN: [id number] DOB: [birth year]  Received call from HIPAA provided via voice mail and return call to husband, Torrance Freestone DPR] that patient is no longer with [facility] providers and now with [facility], was able to find a geriatric physician for his wife. TOC program closed.  Brown Cape, RN, BSN, CCM [facility], [facility] Health RN Care Manager Direct Dial: [phone]

## 2023-07-04 NOTE — Progress Notes (Signed)
 Complex Care Management Care Guide Note  07/04/2023 Name: Anne [name] MRN: [id number] DOB: [birth year]  Anne [name] is a 83 y.o. year old female who is a primary care patient of [name], NP .  Jesse G Cassetta called to cancel call with Brown Cape [facility] RN by phone today. Patient refused to reschedule call. No further outreach attempts will be made at this time.    Follow up plan: none Barnie Bora  [facility], [facility] Guide  Direct Dial: [phone]  Fax [phone]

## 2023-07-05 ENCOUNTER — Ambulatory Visit: Admitting: General Practice

## 2023-07-05 ENCOUNTER — Telehealth: Payer: Self-pay

## 2023-10-01 DIAGNOSIS — Z1331 Encounter for screening for depression: Secondary | ICD-10-CM | POA: Diagnosis not present

## 2023-10-01 DIAGNOSIS — E119 Type 2 diabetes mellitus without complications: Secondary | ICD-10-CM | POA: Diagnosis not present

## 2023-10-01 DIAGNOSIS — I10 Essential (primary) hypertension: Secondary | ICD-10-CM | POA: Diagnosis not present

## 2023-10-01 DIAGNOSIS — E039 Hypothyroidism, unspecified: Secondary | ICD-10-CM | POA: Diagnosis not present

## 2023-12-11 DIAGNOSIS — H43812 Vitreous degeneration, left eye: Secondary | ICD-10-CM | POA: Diagnosis not present

## 2023-12-11 DIAGNOSIS — E119 Type 2 diabetes mellitus without complications: Secondary | ICD-10-CM | POA: Diagnosis not present

## 2023-12-11 DIAGNOSIS — H524 Presbyopia: Secondary | ICD-10-CM | POA: Diagnosis not present

## 2023-12-11 DIAGNOSIS — Z961 Presence of intraocular lens: Secondary | ICD-10-CM | POA: Diagnosis not present
